# Patient Record
Sex: Female | Born: 1970
Health system: Southern US, Community
[De-identification: ages and names within clinical notes are randomized; demographics above are authoritative.]

## PROBLEM LIST (undated history)

## (undated) DIAGNOSIS — T4145XA Adverse effect of unspecified anesthetic, initial encounter: Secondary | ICD-10-CM

## (undated) DIAGNOSIS — F329 Major depressive disorder, single episode, unspecified: Secondary | ICD-10-CM

## (undated) DIAGNOSIS — M1990 Unspecified osteoarthritis, unspecified site: Secondary | ICD-10-CM

## (undated) DIAGNOSIS — F419 Anxiety disorder, unspecified: Secondary | ICD-10-CM

## (undated) DIAGNOSIS — K645 Perianal venous thrombosis: Principal | ICD-10-CM

## (undated) DIAGNOSIS — Z87440 Personal history of urinary (tract) infections: Secondary | ICD-10-CM

## (undated) DIAGNOSIS — Z87442 Personal history of urinary calculi: Secondary | ICD-10-CM

## (undated) DIAGNOSIS — M6289 Other specified disorders of muscle: Secondary | ICD-10-CM

## (undated) DIAGNOSIS — M545 Low back pain, unspecified: Secondary | ICD-10-CM

## (undated) DIAGNOSIS — G43909 Migraine, unspecified, not intractable, without status migrainosus: Secondary | ICD-10-CM

## (undated) DIAGNOSIS — G8929 Other chronic pain: Secondary | ICD-10-CM

## (undated) DIAGNOSIS — E785 Hyperlipidemia, unspecified: Secondary | ICD-10-CM

## (undated) DIAGNOSIS — I10 Essential (primary) hypertension: Secondary | ICD-10-CM

## (undated) DIAGNOSIS — N3010 Interstitial cystitis (chronic) without hematuria: Secondary | ICD-10-CM

## (undated) DIAGNOSIS — Z960 Presence of urogenital implants: Secondary | ICD-10-CM

## (undated) DIAGNOSIS — N200 Calculus of kidney: Secondary | ICD-10-CM

## (undated) DIAGNOSIS — R569 Unspecified convulsions: Secondary | ICD-10-CM

## (undated) DIAGNOSIS — F319 Bipolar disorder, unspecified: Secondary | ICD-10-CM

## (undated) DIAGNOSIS — K648 Other hemorrhoids: Secondary | ICD-10-CM

## (undated) DIAGNOSIS — M797 Fibromyalgia: Secondary | ICD-10-CM

## (undated) DIAGNOSIS — G894 Chronic pain syndrome: Secondary | ICD-10-CM

## (undated) DIAGNOSIS — K219 Gastro-esophageal reflux disease without esophagitis: Secondary | ICD-10-CM

## (undated) DIAGNOSIS — R011 Cardiac murmur, unspecified: Secondary | ICD-10-CM

## (undated) DIAGNOSIS — F32A Depression, unspecified: Secondary | ICD-10-CM

## (undated) DIAGNOSIS — K589 Irritable bowel syndrome without diarrhea: Secondary | ICD-10-CM

## (undated) HISTORY — DX: Other specified disorders of muscle: M62.89

## (undated) HISTORY — DX: Anxiety disorder, unspecified: F41.9

## (undated) HISTORY — DX: Bipolar disorder, unspecified: F31.9

## (undated) HISTORY — DX: Hyperlipidemia, unspecified: E78.5

## (undated) HISTORY — DX: Personal history of urinary calculi: Z87.442

## (undated) HISTORY — PX: CYSTOSCOPY: SUR368

## (undated) HISTORY — DX: Other hemorrhoids: K64.8

## (undated) HISTORY — PX: ABDOMINAL HYSTERECTOMY: SHX81

## (undated) HISTORY — DX: Irritable bowel syndrome, unspecified: K58.9

## (undated) HISTORY — PX: MANDIBLE FRACTURE SURGERY: SHX706

## (undated) HISTORY — DX: Essential (primary) hypertension: I10

## (undated) HISTORY — DX: Unspecified osteoarthritis, unspecified site: M19.90

## (undated) HISTORY — PX: FOOT SURGERY: SHX648

## (undated) HISTORY — PX: LITHOTRIPSY: SUR834

## (undated) HISTORY — PX: COLONOSCOPY: SHX174

## (undated) HISTORY — DX: Personal history of urinary (tract) infections: Z87.440

## (undated) HISTORY — DX: Major depressive disorder, single episode, unspecified: F32.9

## (undated) HISTORY — DX: Cardiac murmur, unspecified: R01.1

## (undated) HISTORY — DX: Perianal venous thrombosis: K64.5

## (undated) HISTORY — PX: RADIOFREQUENCY ABLATION NERVES: SUR1070

## (undated) HISTORY — DX: Depression, unspecified: F32.A

## (undated) HISTORY — DX: Migraine, unspecified, not intractable, without status migrainosus: G43.909

## (undated) HISTORY — PX: OTHER SURGICAL HISTORY: SHX169

## (undated) HISTORY — DX: Fibromyalgia: M79.7

## (undated) HISTORY — PX: KNEE SURGERY: SHX244

## (undated) SURGERY — Surgical Case
Anesthesia: *Unknown

---

## 1997-12-07 ENCOUNTER — Emergency Department (HOSPITAL_COMMUNITY): Admission: EM | Admit: 1997-12-07 | Discharge: 1997-12-07 | Payer: Self-pay | Admitting: Emergency Medicine

## 1998-02-23 ENCOUNTER — Emergency Department (HOSPITAL_COMMUNITY): Admission: EM | Admit: 1998-02-23 | Discharge: 1998-02-24 | Payer: Self-pay | Admitting: Emergency Medicine

## 1999-05-31 DIAGNOSIS — R569 Unspecified convulsions: Secondary | ICD-10-CM

## 1999-05-31 DIAGNOSIS — T8859XA Other complications of anesthesia, initial encounter: Secondary | ICD-10-CM

## 1999-05-31 HISTORY — DX: Other complications of anesthesia, initial encounter: T88.59XA

## 1999-05-31 HISTORY — DX: Unspecified convulsions: R56.9

## 1999-07-05 ENCOUNTER — Emergency Department (HOSPITAL_COMMUNITY): Admission: EM | Admit: 1999-07-05 | Discharge: 1999-07-05 | Payer: Self-pay | Admitting: Emergency Medicine

## 1999-07-06 ENCOUNTER — Encounter: Payer: Self-pay | Admitting: Emergency Medicine

## 1999-09-20 ENCOUNTER — Encounter: Payer: Self-pay | Admitting: Urology

## 1999-09-20 ENCOUNTER — Encounter: Payer: Self-pay | Admitting: Anesthesiology

## 1999-09-20 ENCOUNTER — Inpatient Hospital Stay (HOSPITAL_COMMUNITY): Admission: EM | Admit: 1999-09-20 | Discharge: 1999-09-26 | Payer: Self-pay | Admitting: Emergency Medicine

## 1999-09-20 ENCOUNTER — Encounter: Payer: Self-pay | Admitting: Emergency Medicine

## 1999-09-21 ENCOUNTER — Encounter: Payer: Self-pay | Admitting: Urology

## 1999-09-22 ENCOUNTER — Encounter: Payer: Self-pay | Admitting: Urology

## 1999-09-23 ENCOUNTER — Encounter: Payer: Self-pay | Admitting: Urology

## 1999-09-30 ENCOUNTER — Encounter: Payer: Self-pay | Admitting: Urology

## 1999-09-30 ENCOUNTER — Ambulatory Visit (HOSPITAL_COMMUNITY): Admission: RE | Admit: 1999-09-30 | Discharge: 1999-09-30 | Payer: Self-pay | Admitting: Urology

## 1999-10-06 ENCOUNTER — Encounter: Payer: Self-pay | Admitting: Neurology

## 1999-10-06 ENCOUNTER — Inpatient Hospital Stay (HOSPITAL_COMMUNITY): Admission: EM | Admit: 1999-10-06 | Discharge: 1999-10-08 | Payer: Self-pay | Admitting: Dentistry

## 1999-10-07 ENCOUNTER — Encounter: Payer: Self-pay | Admitting: Neurology

## 2000-05-16 ENCOUNTER — Other Ambulatory Visit: Admission: RE | Admit: 2000-05-16 | Discharge: 2000-05-16 | Payer: Self-pay | Admitting: *Deleted

## 2000-05-29 ENCOUNTER — Ambulatory Visit (HOSPITAL_COMMUNITY): Admission: RE | Admit: 2000-05-29 | Discharge: 2000-05-29 | Payer: Self-pay | Admitting: Neurology

## 2000-05-29 ENCOUNTER — Encounter: Payer: Self-pay | Admitting: Neurology

## 2000-07-24 ENCOUNTER — Emergency Department (HOSPITAL_COMMUNITY): Admission: EM | Admit: 2000-07-24 | Discharge: 2000-07-24 | Payer: Self-pay | Admitting: Emergency Medicine

## 2000-07-25 ENCOUNTER — Emergency Department (HOSPITAL_COMMUNITY): Admission: EM | Admit: 2000-07-25 | Discharge: 2000-07-25 | Payer: Self-pay | Admitting: Emergency Medicine

## 2000-07-27 ENCOUNTER — Encounter: Admission: RE | Admit: 2000-07-27 | Discharge: 2000-10-25 | Payer: Self-pay | Admitting: Anesthesiology

## 2000-09-28 ENCOUNTER — Emergency Department (HOSPITAL_COMMUNITY): Admission: EM | Admit: 2000-09-28 | Discharge: 2000-09-29 | Payer: Self-pay | Admitting: Emergency Medicine

## 2000-10-01 ENCOUNTER — Inpatient Hospital Stay (HOSPITAL_COMMUNITY): Admission: EM | Admit: 2000-10-01 | Discharge: 2000-10-04 | Payer: Self-pay | Admitting: Emergency Medicine

## 2000-10-02 ENCOUNTER — Encounter: Payer: Self-pay | Admitting: Neurosurgery

## 2000-10-04 ENCOUNTER — Encounter: Payer: Self-pay | Admitting: Internal Medicine

## 2000-10-09 ENCOUNTER — Emergency Department (HOSPITAL_COMMUNITY): Admission: EM | Admit: 2000-10-09 | Discharge: 2000-10-09 | Payer: Self-pay | Admitting: Emergency Medicine

## 2000-10-19 ENCOUNTER — Inpatient Hospital Stay (HOSPITAL_COMMUNITY): Admission: EM | Admit: 2000-10-19 | Discharge: 2000-10-22 | Payer: Self-pay | Admitting: Internal Medicine

## 2000-10-30 ENCOUNTER — Encounter: Admission: RE | Admit: 2000-10-30 | Discharge: 2000-12-27 | Payer: Self-pay | Admitting: Anesthesiology

## 2001-03-02 ENCOUNTER — Ambulatory Visit (HOSPITAL_BASED_OUTPATIENT_CLINIC_OR_DEPARTMENT_OTHER): Admission: RE | Admit: 2001-03-02 | Discharge: 2001-03-02 | Payer: Self-pay | Admitting: Urology

## 2001-04-12 ENCOUNTER — Emergency Department (HOSPITAL_COMMUNITY): Admission: EM | Admit: 2001-04-12 | Discharge: 2001-04-12 | Payer: Self-pay | Admitting: Emergency Medicine

## 2001-04-12 ENCOUNTER — Encounter: Payer: Self-pay | Admitting: Emergency Medicine

## 2001-05-21 ENCOUNTER — Other Ambulatory Visit: Admission: RE | Admit: 2001-05-21 | Discharge: 2001-05-21 | Payer: Self-pay | Admitting: *Deleted

## 2001-05-29 ENCOUNTER — Encounter: Payer: Self-pay | Admitting: Emergency Medicine

## 2001-05-29 ENCOUNTER — Emergency Department (HOSPITAL_COMMUNITY): Admission: EM | Admit: 2001-05-29 | Discharge: 2001-05-30 | Payer: Self-pay | Admitting: Emergency Medicine

## 2001-05-31 ENCOUNTER — Emergency Department (HOSPITAL_COMMUNITY): Admission: EM | Admit: 2001-05-31 | Discharge: 2001-05-31 | Payer: Self-pay | Admitting: Emergency Medicine

## 2001-05-31 ENCOUNTER — Encounter: Payer: Self-pay | Admitting: Emergency Medicine

## 2001-06-06 ENCOUNTER — Encounter: Payer: Self-pay | Admitting: Urology

## 2001-06-06 ENCOUNTER — Ambulatory Visit (HOSPITAL_BASED_OUTPATIENT_CLINIC_OR_DEPARTMENT_OTHER): Admission: RE | Admit: 2001-06-06 | Discharge: 2001-06-06 | Payer: Self-pay | Admitting: Urology

## 2001-07-13 ENCOUNTER — Emergency Department (HOSPITAL_COMMUNITY): Admission: EM | Admit: 2001-07-13 | Discharge: 2001-07-13 | Payer: Self-pay | Admitting: Emergency Medicine

## 2001-07-14 ENCOUNTER — Inpatient Hospital Stay (HOSPITAL_COMMUNITY): Admission: EM | Admit: 2001-07-14 | Discharge: 2001-07-18 | Payer: Self-pay | Admitting: Emergency Medicine

## 2001-07-15 ENCOUNTER — Encounter: Payer: Self-pay | Admitting: Neurology

## 2001-07-17 ENCOUNTER — Encounter: Payer: Self-pay | Admitting: Neurology

## 2001-09-12 ENCOUNTER — Ambulatory Visit (HOSPITAL_BASED_OUTPATIENT_CLINIC_OR_DEPARTMENT_OTHER): Admission: RE | Admit: 2001-09-12 | Discharge: 2001-09-12 | Payer: Self-pay | Admitting: Urology

## 2001-12-01 ENCOUNTER — Encounter: Payer: Self-pay | Admitting: Emergency Medicine

## 2001-12-01 ENCOUNTER — Emergency Department (HOSPITAL_COMMUNITY): Admission: EM | Admit: 2001-12-01 | Discharge: 2001-12-01 | Payer: Self-pay | Admitting: Emergency Medicine

## 2002-01-06 ENCOUNTER — Emergency Department (HOSPITAL_COMMUNITY): Admission: EM | Admit: 2002-01-06 | Discharge: 2002-01-06 | Payer: Self-pay | Admitting: Emergency Medicine

## 2002-01-06 ENCOUNTER — Encounter: Payer: Self-pay | Admitting: Emergency Medicine

## 2002-01-18 ENCOUNTER — Encounter: Payer: Self-pay | Admitting: Neurosurgery

## 2002-01-18 ENCOUNTER — Encounter: Admission: RE | Admit: 2002-01-18 | Discharge: 2002-01-18 | Payer: Self-pay | Admitting: Neurosurgery

## 2002-03-04 ENCOUNTER — Emergency Department (HOSPITAL_COMMUNITY): Admission: EM | Admit: 2002-03-04 | Discharge: 2002-03-04 | Payer: Self-pay | Admitting: *Deleted

## 2002-03-05 ENCOUNTER — Observation Stay (HOSPITAL_COMMUNITY): Admission: AD | Admit: 2002-03-05 | Discharge: 2002-03-06 | Payer: Self-pay | Admitting: Urology

## 2002-03-14 ENCOUNTER — Encounter: Payer: Self-pay | Admitting: Urology

## 2002-03-14 ENCOUNTER — Ambulatory Visit (HOSPITAL_BASED_OUTPATIENT_CLINIC_OR_DEPARTMENT_OTHER): Admission: RE | Admit: 2002-03-14 | Discharge: 2002-03-14 | Payer: Self-pay | Admitting: Urology

## 2002-03-20 ENCOUNTER — Ambulatory Visit (HOSPITAL_BASED_OUTPATIENT_CLINIC_OR_DEPARTMENT_OTHER): Admission: RE | Admit: 2002-03-20 | Discharge: 2002-03-20 | Payer: Self-pay | Admitting: Urology

## 2002-06-17 ENCOUNTER — Ambulatory Visit (HOSPITAL_BASED_OUTPATIENT_CLINIC_OR_DEPARTMENT_OTHER): Admission: RE | Admit: 2002-06-17 | Discharge: 2002-06-17 | Payer: Self-pay | Admitting: Urology

## 2002-07-06 ENCOUNTER — Encounter: Payer: Self-pay | Admitting: Emergency Medicine

## 2002-07-06 ENCOUNTER — Ambulatory Visit (HOSPITAL_COMMUNITY): Admission: EM | Admit: 2002-07-06 | Discharge: 2002-07-06 | Payer: Self-pay | Admitting: Emergency Medicine

## 2002-07-08 ENCOUNTER — Inpatient Hospital Stay (HOSPITAL_COMMUNITY): Admission: RE | Admit: 2002-07-08 | Discharge: 2002-07-12 | Payer: Self-pay | Admitting: Urology

## 2002-07-08 ENCOUNTER — Encounter: Payer: Self-pay | Admitting: Urology

## 2002-07-11 ENCOUNTER — Encounter: Payer: Self-pay | Admitting: Urology

## 2002-10-03 ENCOUNTER — Emergency Department (HOSPITAL_COMMUNITY): Admission: EM | Admit: 2002-10-03 | Discharge: 2002-10-04 | Payer: Self-pay | Admitting: Emergency Medicine

## 2002-10-03 ENCOUNTER — Encounter: Payer: Self-pay | Admitting: Emergency Medicine

## 2002-11-11 ENCOUNTER — Ambulatory Visit (HOSPITAL_BASED_OUTPATIENT_CLINIC_OR_DEPARTMENT_OTHER): Admission: RE | Admit: 2002-11-11 | Discharge: 2002-11-11 | Payer: Self-pay | Admitting: Urology

## 2003-03-10 ENCOUNTER — Ambulatory Visit (HOSPITAL_BASED_OUTPATIENT_CLINIC_OR_DEPARTMENT_OTHER): Admission: RE | Admit: 2003-03-10 | Discharge: 2003-03-10 | Payer: Self-pay | Admitting: Urology

## 2003-03-10 ENCOUNTER — Ambulatory Visit (HOSPITAL_COMMUNITY): Admission: RE | Admit: 2003-03-10 | Discharge: 2003-03-10 | Payer: Self-pay | Admitting: Urology

## 2003-07-31 ENCOUNTER — Inpatient Hospital Stay (HOSPITAL_COMMUNITY): Admission: EM | Admit: 2003-07-31 | Discharge: 2003-08-07 | Payer: Self-pay | Admitting: Urology

## 2003-08-05 ENCOUNTER — Encounter (INDEPENDENT_AMBULATORY_CARE_PROVIDER_SITE_OTHER): Payer: Self-pay | Admitting: Specialist

## 2003-08-15 ENCOUNTER — Ambulatory Visit (HOSPITAL_BASED_OUTPATIENT_CLINIC_OR_DEPARTMENT_OTHER): Admission: RE | Admit: 2003-08-15 | Discharge: 2003-08-15 | Payer: Self-pay | Admitting: Urology

## 2003-08-15 ENCOUNTER — Ambulatory Visit (HOSPITAL_COMMUNITY): Admission: RE | Admit: 2003-08-15 | Discharge: 2003-08-15 | Payer: Self-pay | Admitting: Urology

## 2003-08-29 ENCOUNTER — Ambulatory Visit (HOSPITAL_BASED_OUTPATIENT_CLINIC_OR_DEPARTMENT_OTHER): Admission: RE | Admit: 2003-08-29 | Discharge: 2003-08-29 | Payer: Self-pay | Admitting: Urology

## 2003-08-29 ENCOUNTER — Ambulatory Visit (HOSPITAL_COMMUNITY): Admission: RE | Admit: 2003-08-29 | Discharge: 2003-08-29 | Payer: Self-pay | Admitting: Urology

## 2003-10-03 ENCOUNTER — Ambulatory Visit (HOSPITAL_COMMUNITY): Admission: RE | Admit: 2003-10-03 | Discharge: 2003-10-03 | Payer: Self-pay | Admitting: *Deleted

## 2003-11-12 ENCOUNTER — Emergency Department (HOSPITAL_COMMUNITY): Admission: EM | Admit: 2003-11-12 | Discharge: 2003-11-12 | Payer: Self-pay | Admitting: Emergency Medicine

## 2003-12-30 ENCOUNTER — Ambulatory Visit (HOSPITAL_BASED_OUTPATIENT_CLINIC_OR_DEPARTMENT_OTHER): Admission: RE | Admit: 2003-12-30 | Discharge: 2003-12-30 | Payer: Self-pay | Admitting: Urology

## 2003-12-30 ENCOUNTER — Ambulatory Visit (HOSPITAL_COMMUNITY): Admission: RE | Admit: 2003-12-30 | Discharge: 2003-12-30 | Payer: Self-pay | Admitting: Urology

## 2004-04-06 ENCOUNTER — Ambulatory Visit (HOSPITAL_COMMUNITY): Admission: RE | Admit: 2004-04-06 | Discharge: 2004-04-06 | Payer: Self-pay | Admitting: Urology

## 2004-04-06 ENCOUNTER — Ambulatory Visit (HOSPITAL_BASED_OUTPATIENT_CLINIC_OR_DEPARTMENT_OTHER): Admission: RE | Admit: 2004-04-06 | Discharge: 2004-04-06 | Payer: Self-pay | Admitting: Urology

## 2004-06-07 ENCOUNTER — Emergency Department (HOSPITAL_COMMUNITY): Admission: EM | Admit: 2004-06-07 | Discharge: 2004-06-07 | Payer: Self-pay | Admitting: Emergency Medicine

## 2004-06-14 ENCOUNTER — Inpatient Hospital Stay (HOSPITAL_COMMUNITY): Admission: AD | Admit: 2004-06-14 | Discharge: 2004-06-19 | Payer: Self-pay | Admitting: Urology

## 2004-06-17 ENCOUNTER — Encounter (INDEPENDENT_AMBULATORY_CARE_PROVIDER_SITE_OTHER): Payer: Self-pay | Admitting: *Deleted

## 2004-07-20 ENCOUNTER — Ambulatory Visit: Payer: Self-pay | Admitting: Internal Medicine

## 2004-08-11 ENCOUNTER — Encounter (INDEPENDENT_AMBULATORY_CARE_PROVIDER_SITE_OTHER): Payer: Self-pay | Admitting: Specialist

## 2004-08-11 ENCOUNTER — Ambulatory Visit (HOSPITAL_COMMUNITY): Admission: RE | Admit: 2004-08-11 | Discharge: 2004-08-11 | Payer: Self-pay | Admitting: *Deleted

## 2004-08-24 ENCOUNTER — Emergency Department (HOSPITAL_COMMUNITY): Admission: EM | Admit: 2004-08-24 | Discharge: 2004-08-24 | Payer: Self-pay | Admitting: Emergency Medicine

## 2004-10-07 ENCOUNTER — Ambulatory Visit: Payer: Self-pay | Admitting: Endocrinology

## 2004-10-11 ENCOUNTER — Ambulatory Visit: Payer: Self-pay | Admitting: Endocrinology

## 2004-11-04 ENCOUNTER — Ambulatory Visit (HOSPITAL_COMMUNITY): Admission: RE | Admit: 2004-11-04 | Discharge: 2004-11-04 | Payer: Self-pay | Admitting: *Deleted

## 2004-11-04 ENCOUNTER — Encounter (INDEPENDENT_AMBULATORY_CARE_PROVIDER_SITE_OTHER): Payer: Self-pay | Admitting: *Deleted

## 2004-11-05 ENCOUNTER — Inpatient Hospital Stay (HOSPITAL_COMMUNITY): Admission: EM | Admit: 2004-11-05 | Discharge: 2004-11-12 | Payer: Self-pay | Admitting: *Deleted

## 2004-11-05 ENCOUNTER — Ambulatory Visit: Payer: Self-pay | Admitting: Internal Medicine

## 2004-11-10 ENCOUNTER — Encounter (INDEPENDENT_AMBULATORY_CARE_PROVIDER_SITE_OTHER): Payer: Self-pay | Admitting: Specialist

## 2005-01-25 ENCOUNTER — Ambulatory Visit (HOSPITAL_BASED_OUTPATIENT_CLINIC_OR_DEPARTMENT_OTHER): Admission: RE | Admit: 2005-01-25 | Discharge: 2005-01-25 | Payer: Self-pay | Admitting: Urology

## 2005-01-25 ENCOUNTER — Ambulatory Visit (HOSPITAL_COMMUNITY): Admission: RE | Admit: 2005-01-25 | Discharge: 2005-01-25 | Payer: Self-pay | Admitting: Urology

## 2005-02-05 ENCOUNTER — Emergency Department (HOSPITAL_COMMUNITY): Admission: EM | Admit: 2005-02-05 | Discharge: 2005-02-05 | Payer: Self-pay | Admitting: Emergency Medicine

## 2005-04-26 ENCOUNTER — Encounter: Admission: RE | Admit: 2005-04-26 | Discharge: 2005-04-26 | Payer: Self-pay | Admitting: *Deleted

## 2005-05-17 ENCOUNTER — Ambulatory Visit (HOSPITAL_BASED_OUTPATIENT_CLINIC_OR_DEPARTMENT_OTHER): Admission: RE | Admit: 2005-05-17 | Discharge: 2005-05-17 | Payer: Self-pay | Admitting: Urology

## 2005-05-17 ENCOUNTER — Ambulatory Visit (HOSPITAL_COMMUNITY): Admission: RE | Admit: 2005-05-17 | Discharge: 2005-05-17 | Payer: Self-pay | Admitting: Urology

## 2005-05-30 HISTORY — PX: GASTROJEJUNOSTOMY W/ JEJUNOSTOMY TUBE: SHX1698

## 2005-06-03 ENCOUNTER — Ambulatory Visit (HOSPITAL_COMMUNITY): Admission: RE | Admit: 2005-06-03 | Discharge: 2005-06-03 | Payer: Self-pay | Admitting: *Deleted

## 2005-06-15 ENCOUNTER — Ambulatory Visit: Payer: Self-pay | Admitting: Endocrinology

## 2005-06-22 ENCOUNTER — Ambulatory Visit: Payer: Self-pay | Admitting: Endocrinology

## 2005-06-25 ENCOUNTER — Emergency Department (HOSPITAL_COMMUNITY): Admission: EM | Admit: 2005-06-25 | Discharge: 2005-06-25 | Payer: Self-pay | Admitting: Emergency Medicine

## 2005-06-29 ENCOUNTER — Ambulatory Visit: Payer: Self-pay | Admitting: Internal Medicine

## 2005-07-03 ENCOUNTER — Emergency Department (HOSPITAL_COMMUNITY): Admission: EM | Admit: 2005-07-03 | Discharge: 2005-07-04 | Payer: Self-pay | Admitting: Emergency Medicine

## 2005-07-05 ENCOUNTER — Ambulatory Visit: Payer: Self-pay | Admitting: Internal Medicine

## 2005-07-08 ENCOUNTER — Ambulatory Visit (HOSPITAL_COMMUNITY): Admission: RE | Admit: 2005-07-08 | Discharge: 2005-07-08 | Payer: Self-pay | Admitting: Internal Medicine

## 2005-07-14 ENCOUNTER — Emergency Department (HOSPITAL_COMMUNITY): Admission: EM | Admit: 2005-07-14 | Discharge: 2005-07-14 | Payer: Self-pay | Admitting: Emergency Medicine

## 2005-07-30 ENCOUNTER — Ambulatory Visit (HOSPITAL_COMMUNITY): Admission: RE | Admit: 2005-07-30 | Discharge: 2005-07-30 | Payer: Self-pay | Admitting: Internal Medicine

## 2005-08-05 ENCOUNTER — Ambulatory Visit: Payer: Self-pay | Admitting: Internal Medicine

## 2005-08-10 ENCOUNTER — Ambulatory Visit (HOSPITAL_COMMUNITY): Admission: RE | Admit: 2005-08-10 | Discharge: 2005-08-10 | Payer: Self-pay | Admitting: Internal Medicine

## 2005-08-19 ENCOUNTER — Ambulatory Visit (HOSPITAL_COMMUNITY): Admission: RE | Admit: 2005-08-19 | Discharge: 2005-08-19 | Payer: Self-pay | Admitting: Internal Medicine

## 2005-08-23 ENCOUNTER — Ambulatory Visit (HOSPITAL_COMMUNITY): Admission: RE | Admit: 2005-08-23 | Discharge: 2005-08-23 | Payer: Self-pay | Admitting: Internal Medicine

## 2005-08-24 ENCOUNTER — Ambulatory Visit: Payer: Self-pay | Admitting: Internal Medicine

## 2005-08-29 ENCOUNTER — Ambulatory Visit: Payer: Self-pay | Admitting: Endocrinology

## 2005-08-30 ENCOUNTER — Ambulatory Visit (HOSPITAL_BASED_OUTPATIENT_CLINIC_OR_DEPARTMENT_OTHER): Admission: RE | Admit: 2005-08-30 | Discharge: 2005-08-30 | Payer: Self-pay | Admitting: Urology

## 2005-09-02 ENCOUNTER — Encounter (INDEPENDENT_AMBULATORY_CARE_PROVIDER_SITE_OTHER): Payer: Self-pay | Admitting: *Deleted

## 2005-09-02 ENCOUNTER — Emergency Department (HOSPITAL_COMMUNITY): Admission: EM | Admit: 2005-09-02 | Discharge: 2005-09-02 | Payer: Self-pay | Admitting: Emergency Medicine

## 2005-09-05 ENCOUNTER — Ambulatory Visit: Payer: Self-pay | Admitting: Gastroenterology

## 2005-09-05 ENCOUNTER — Ambulatory Visit (HOSPITAL_COMMUNITY): Admission: RE | Admit: 2005-09-05 | Discharge: 2005-09-05 | Payer: Self-pay | Admitting: Internal Medicine

## 2005-09-06 ENCOUNTER — Ambulatory Visit (HOSPITAL_COMMUNITY): Admission: RE | Admit: 2005-09-06 | Discharge: 2005-09-06 | Payer: Self-pay | Admitting: Gastroenterology

## 2005-09-22 ENCOUNTER — Ambulatory Visit (HOSPITAL_COMMUNITY): Admission: RE | Admit: 2005-09-22 | Discharge: 2005-09-22 | Payer: Self-pay | Admitting: Internal Medicine

## 2005-09-22 HISTORY — PX: UPPER GASTROINTESTINAL ENDOSCOPY: SHX188

## 2005-10-08 ENCOUNTER — Encounter: Payer: Self-pay | Admitting: Internal Medicine

## 2005-11-04 ENCOUNTER — Ambulatory Visit: Payer: Self-pay | Admitting: Internal Medicine

## 2005-12-09 ENCOUNTER — Ambulatory Visit: Payer: Self-pay | Admitting: Internal Medicine

## 2005-12-14 ENCOUNTER — Ambulatory Visit: Payer: Self-pay | Admitting: Internal Medicine

## 2006-02-01 ENCOUNTER — Ambulatory Visit: Payer: Self-pay | Admitting: Internal Medicine

## 2006-02-03 ENCOUNTER — Ambulatory Visit (HOSPITAL_BASED_OUTPATIENT_CLINIC_OR_DEPARTMENT_OTHER): Admission: RE | Admit: 2006-02-03 | Discharge: 2006-02-03 | Payer: Self-pay

## 2006-03-08 ENCOUNTER — Ambulatory Visit (HOSPITAL_COMMUNITY): Admission: RE | Admit: 2006-03-08 | Discharge: 2006-03-08 | Payer: Self-pay | Admitting: Internal Medicine

## 2006-03-08 ENCOUNTER — Ambulatory Visit: Payer: Self-pay | Admitting: Internal Medicine

## 2006-03-10 ENCOUNTER — Ambulatory Visit (HOSPITAL_COMMUNITY): Admission: RE | Admit: 2006-03-10 | Discharge: 2006-03-10 | Payer: Self-pay | Admitting: Internal Medicine

## 2006-03-15 ENCOUNTER — Observation Stay (HOSPITAL_COMMUNITY): Admission: EM | Admit: 2006-03-15 | Discharge: 2006-03-17 | Payer: Self-pay | Admitting: Emergency Medicine

## 2006-03-20 ENCOUNTER — Ambulatory Visit: Payer: Self-pay | Admitting: Internal Medicine

## 2006-03-28 ENCOUNTER — Ambulatory Visit: Payer: Self-pay | Admitting: Internal Medicine

## 2006-06-27 ENCOUNTER — Ambulatory Visit: Payer: Self-pay | Admitting: Internal Medicine

## 2006-07-28 ENCOUNTER — Encounter: Admission: RE | Admit: 2006-07-28 | Discharge: 2006-07-28 | Payer: Self-pay | Admitting: *Deleted

## 2006-08-14 ENCOUNTER — Ambulatory Visit (HOSPITAL_BASED_OUTPATIENT_CLINIC_OR_DEPARTMENT_OTHER): Admission: RE | Admit: 2006-08-14 | Discharge: 2006-08-14 | Payer: Self-pay | Admitting: Urology

## 2006-08-22 ENCOUNTER — Encounter: Payer: Self-pay | Admitting: Internal Medicine

## 2006-09-13 ENCOUNTER — Observation Stay (HOSPITAL_COMMUNITY): Admission: EM | Admit: 2006-09-13 | Discharge: 2006-09-14 | Payer: Self-pay | Admitting: Emergency Medicine

## 2006-09-14 ENCOUNTER — Encounter: Payer: Self-pay | Admitting: Internal Medicine

## 2006-09-14 HISTORY — PX: UPPER GASTROINTESTINAL ENDOSCOPY: SHX188

## 2006-09-19 ENCOUNTER — Ambulatory Visit: Payer: Self-pay | Admitting: Internal Medicine

## 2006-10-16 ENCOUNTER — Encounter: Admission: RE | Admit: 2006-10-16 | Discharge: 2006-10-16 | Payer: Self-pay | Admitting: Neurology

## 2006-12-05 ENCOUNTER — Emergency Department (HOSPITAL_COMMUNITY): Admission: EM | Admit: 2006-12-05 | Discharge: 2006-12-05 | Payer: Self-pay | Admitting: Emergency Medicine

## 2007-01-25 ENCOUNTER — Emergency Department (HOSPITAL_COMMUNITY): Admission: EM | Admit: 2007-01-25 | Discharge: 2007-01-25 | Payer: Self-pay | Admitting: Emergency Medicine

## 2007-02-07 ENCOUNTER — Emergency Department (HOSPITAL_COMMUNITY): Admission: EM | Admit: 2007-02-07 | Discharge: 2007-02-07 | Payer: Self-pay | Admitting: Emergency Medicine

## 2007-04-10 ENCOUNTER — Ambulatory Visit (HOSPITAL_BASED_OUTPATIENT_CLINIC_OR_DEPARTMENT_OTHER): Admission: RE | Admit: 2007-04-10 | Discharge: 2007-04-10 | Payer: Self-pay | Admitting: Urology

## 2007-07-17 ENCOUNTER — Ambulatory Visit: Payer: Self-pay | Admitting: Internal Medicine

## 2007-07-17 DIAGNOSIS — Z8669 Personal history of other diseases of the nervous system and sense organs: Secondary | ICD-10-CM

## 2007-07-17 DIAGNOSIS — Z8711 Personal history of peptic ulcer disease: Secondary | ICD-10-CM | POA: Insufficient documentation

## 2007-07-17 DIAGNOSIS — I059 Rheumatic mitral valve disease, unspecified: Secondary | ICD-10-CM | POA: Insufficient documentation

## 2007-07-17 DIAGNOSIS — N3010 Interstitial cystitis (chronic) without hematuria: Secondary | ICD-10-CM | POA: Insufficient documentation

## 2007-07-17 DIAGNOSIS — F411 Generalized anxiety disorder: Secondary | ICD-10-CM | POA: Insufficient documentation

## 2007-07-17 DIAGNOSIS — G43909 Migraine, unspecified, not intractable, without status migrainosus: Secondary | ICD-10-CM | POA: Insufficient documentation

## 2007-07-17 DIAGNOSIS — Z87442 Personal history of urinary calculi: Secondary | ICD-10-CM | POA: Insufficient documentation

## 2007-07-17 DIAGNOSIS — F4310 Post-traumatic stress disorder, unspecified: Secondary | ICD-10-CM | POA: Insufficient documentation

## 2007-07-17 DIAGNOSIS — R109 Unspecified abdominal pain: Secondary | ICD-10-CM | POA: Insufficient documentation

## 2007-07-17 HISTORY — DX: Personal history of other diseases of the nervous system and sense organs: Z86.69

## 2007-10-09 ENCOUNTER — Encounter: Admission: RE | Admit: 2007-10-09 | Discharge: 2007-10-09 | Payer: Self-pay | Admitting: Orthopedic Surgery

## 2007-10-09 ENCOUNTER — Telehealth: Payer: Self-pay | Admitting: Internal Medicine

## 2007-10-10 ENCOUNTER — Telehealth: Payer: Self-pay | Admitting: Internal Medicine

## 2007-10-11 ENCOUNTER — Emergency Department (HOSPITAL_COMMUNITY): Admission: EM | Admit: 2007-10-11 | Discharge: 2007-10-11 | Payer: Self-pay | Admitting: Emergency Medicine

## 2007-10-12 ENCOUNTER — Ambulatory Visit (HOSPITAL_COMMUNITY): Admission: AD | Admit: 2007-10-12 | Discharge: 2007-10-13 | Payer: Self-pay | Admitting: Urology

## 2007-11-09 ENCOUNTER — Observation Stay (HOSPITAL_COMMUNITY): Admission: AD | Admit: 2007-11-09 | Discharge: 2007-11-10 | Payer: Self-pay | Admitting: Internal Medicine

## 2007-12-04 ENCOUNTER — Telehealth: Payer: Self-pay | Admitting: Internal Medicine

## 2007-12-04 ENCOUNTER — Encounter: Payer: Self-pay | Admitting: Internal Medicine

## 2008-01-04 ENCOUNTER — Ambulatory Visit (HOSPITAL_BASED_OUTPATIENT_CLINIC_OR_DEPARTMENT_OTHER): Admission: RE | Admit: 2008-01-04 | Discharge: 2008-01-04 | Payer: Self-pay | Admitting: Urology

## 2008-03-14 ENCOUNTER — Emergency Department (HOSPITAL_COMMUNITY): Admission: EM | Admit: 2008-03-14 | Discharge: 2008-03-14 | Payer: Self-pay | Admitting: Emergency Medicine

## 2008-04-15 ENCOUNTER — Ambulatory Visit (HOSPITAL_BASED_OUTPATIENT_CLINIC_OR_DEPARTMENT_OTHER): Admission: RE | Admit: 2008-04-15 | Discharge: 2008-04-15 | Payer: Self-pay | Admitting: Urology

## 2008-06-05 ENCOUNTER — Encounter: Payer: Self-pay | Admitting: Endocrinology

## 2008-08-24 ENCOUNTER — Emergency Department (HOSPITAL_COMMUNITY): Admission: EM | Admit: 2008-08-24 | Discharge: 2008-08-25 | Payer: Self-pay | Admitting: Emergency Medicine

## 2008-09-03 ENCOUNTER — Encounter: Payer: Self-pay | Admitting: Endocrinology

## 2008-10-13 ENCOUNTER — Emergency Department (HOSPITAL_BASED_OUTPATIENT_CLINIC_OR_DEPARTMENT_OTHER): Admission: EM | Admit: 2008-10-13 | Discharge: 2008-10-14 | Payer: Self-pay | Admitting: Emergency Medicine

## 2008-10-15 ENCOUNTER — Ambulatory Visit: Payer: Self-pay | Admitting: Interventional Radiology

## 2008-10-15 ENCOUNTER — Emergency Department (HOSPITAL_BASED_OUTPATIENT_CLINIC_OR_DEPARTMENT_OTHER): Admission: EM | Admit: 2008-10-15 | Discharge: 2008-10-15 | Payer: Self-pay | Admitting: Emergency Medicine

## 2009-01-12 ENCOUNTER — Ambulatory Visit: Payer: Self-pay | Admitting: Internal Medicine

## 2009-01-12 DIAGNOSIS — K589 Irritable bowel syndrome without diarrhea: Secondary | ICD-10-CM | POA: Insufficient documentation

## 2009-01-12 DIAGNOSIS — K5900 Constipation, unspecified: Secondary | ICD-10-CM | POA: Insufficient documentation

## 2009-01-12 LAB — CONVERTED CEMR LAB: IgA: 92 mg/dL (ref 68–378)

## 2009-03-06 ENCOUNTER — Inpatient Hospital Stay (HOSPITAL_COMMUNITY): Admission: AD | Admit: 2009-03-06 | Discharge: 2009-03-06 | Payer: Self-pay | Admitting: Obstetrics and Gynecology

## 2009-03-25 ENCOUNTER — Encounter: Payer: Self-pay | Admitting: Endocrinology

## 2009-04-24 ENCOUNTER — Inpatient Hospital Stay (HOSPITAL_COMMUNITY): Admission: AD | Admit: 2009-04-24 | Discharge: 2009-04-24 | Payer: Self-pay | Admitting: Obstetrics and Gynecology

## 2009-06-16 ENCOUNTER — Encounter: Payer: Self-pay | Admitting: Internal Medicine

## 2009-07-14 ENCOUNTER — Inpatient Hospital Stay (HOSPITAL_COMMUNITY): Admission: AD | Admit: 2009-07-14 | Discharge: 2009-07-15 | Payer: Self-pay | Admitting: Obstetrics

## 2009-07-17 ENCOUNTER — Inpatient Hospital Stay (HOSPITAL_COMMUNITY): Admission: AD | Admit: 2009-07-17 | Discharge: 2009-07-17 | Payer: Self-pay | Admitting: Obstetrics and Gynecology

## 2009-07-28 ENCOUNTER — Inpatient Hospital Stay (HOSPITAL_COMMUNITY): Admission: AD | Admit: 2009-07-28 | Discharge: 2009-08-04 | Payer: Self-pay | Admitting: Obstetrics and Gynecology

## 2009-07-29 ENCOUNTER — Encounter: Payer: Self-pay | Admitting: Obstetrics and Gynecology

## 2009-07-31 ENCOUNTER — Encounter (INDEPENDENT_AMBULATORY_CARE_PROVIDER_SITE_OTHER): Payer: Self-pay | Admitting: Obstetrics and Gynecology

## 2009-08-05 ENCOUNTER — Encounter: Admission: RE | Admit: 2009-08-05 | Discharge: 2009-09-04 | Payer: Self-pay | Admitting: Obstetrics and Gynecology

## 2009-08-08 ENCOUNTER — Ambulatory Visit: Payer: Self-pay | Admitting: Cardiology

## 2009-08-08 ENCOUNTER — Inpatient Hospital Stay (HOSPITAL_COMMUNITY): Admission: AD | Admit: 2009-08-08 | Discharge: 2009-08-12 | Payer: Self-pay | Admitting: Obstetrics

## 2009-08-08 ENCOUNTER — Encounter (INDEPENDENT_AMBULATORY_CARE_PROVIDER_SITE_OTHER): Payer: Self-pay | Admitting: Obstetrics

## 2009-08-08 ENCOUNTER — Ambulatory Visit: Payer: Self-pay | Admitting: Vascular Surgery

## 2009-08-08 ENCOUNTER — Ambulatory Visit: Payer: Self-pay | Admitting: Internal Medicine

## 2009-08-10 ENCOUNTER — Encounter: Payer: Self-pay | Admitting: Internal Medicine

## 2009-08-26 ENCOUNTER — Encounter: Admission: RE | Admit: 2009-08-26 | Discharge: 2009-08-26 | Payer: Self-pay | Admitting: Cardiovascular Disease

## 2009-08-28 ENCOUNTER — Encounter: Admission: RE | Admit: 2009-08-28 | Discharge: 2009-08-28 | Payer: Self-pay | Admitting: Cardiovascular Disease

## 2009-09-05 ENCOUNTER — Encounter: Admission: RE | Admit: 2009-09-05 | Discharge: 2009-10-05 | Payer: Self-pay | Admitting: Obstetrics and Gynecology

## 2009-10-06 ENCOUNTER — Encounter: Admission: RE | Admit: 2009-10-06 | Discharge: 2009-11-05 | Payer: Self-pay | Admitting: Obstetrics and Gynecology

## 2009-11-06 ENCOUNTER — Encounter: Admission: RE | Admit: 2009-11-06 | Discharge: 2009-12-06 | Payer: Self-pay | Admitting: Obstetrics and Gynecology

## 2009-11-18 ENCOUNTER — Encounter: Payer: Self-pay | Admitting: Endocrinology

## 2009-12-07 ENCOUNTER — Encounter: Admission: RE | Admit: 2009-12-07 | Discharge: 2010-01-06 | Payer: Self-pay | Admitting: Obstetrics and Gynecology

## 2010-01-07 ENCOUNTER — Encounter: Admission: RE | Admit: 2010-01-07 | Discharge: 2010-02-06 | Payer: Self-pay | Admitting: Obstetrics and Gynecology

## 2010-01-12 ENCOUNTER — Ambulatory Visit: Payer: Self-pay | Admitting: Cardiovascular Disease

## 2010-01-25 ENCOUNTER — Encounter: Payer: Self-pay | Admitting: Endocrinology

## 2010-02-04 ENCOUNTER — Ambulatory Visit: Payer: Self-pay | Admitting: Cardiovascular Disease

## 2010-05-30 HISTORY — PX: INTERSTIM IMPLANT PLACEMENT: SHX5130

## 2010-06-14 ENCOUNTER — Emergency Department (HOSPITAL_BASED_OUTPATIENT_CLINIC_OR_DEPARTMENT_OTHER)
Admission: EM | Admit: 2010-06-14 | Discharge: 2010-06-14 | Payer: Self-pay | Source: Home / Self Care | Admitting: Emergency Medicine

## 2010-06-16 LAB — CBC
HCT: 40.3 % (ref 36.0–46.0)
Hemoglobin: 13.3 g/dL (ref 12.0–15.0)
MCH: 26.7 pg (ref 26.0–34.0)
MCHC: 33 g/dL (ref 30.0–36.0)
MCV: 80.8 fL (ref 78.0–100.0)
Platelets: 321 10*3/uL (ref 150–400)
RBC: 4.99 MIL/uL (ref 3.87–5.11)
RDW: 14.1 % (ref 11.5–15.5)
WBC: 8 10*3/uL (ref 4.0–10.5)

## 2010-06-16 LAB — BASIC METABOLIC PANEL
BUN: 15 mg/dL (ref 6–23)
CO2: 25 mEq/L (ref 19–32)
Calcium: 9.4 mg/dL (ref 8.4–10.5)
Chloride: 107 mEq/L (ref 96–112)
Creatinine, Ser: 0.6 mg/dL (ref 0.4–1.2)
GFR calc Af Amer: >60 mL/min (ref >60)
GFR calc non Af Amer: >60 mL/min (ref >60)
Glucose, Bld: 94 mg/dL (ref 70–99)
Potassium: 4.4 mEq/L (ref 3.5–5.1)
Sodium: 144 mEq/L (ref 135–145)

## 2010-06-16 LAB — URINE CULTURE
Colony Count: NO GROWTH
Culture  Setup Time: 201201161842
Culture: NO GROWTH

## 2010-06-16 LAB — URINALYSIS, ROUTINE W REFLEX MICROSCOPIC
Hgb urine dipstick: NEGATIVE
Ketones, ur: NEGATIVE mg/dL
Leukocytes, UA: NEGATIVE
Nitrite: NEGATIVE
Protein, ur: 30 mg/dL — AB
Specific Gravity, Urine: 1.03 (ref 1.005–1.030)
Urine Glucose, Fasting: NEGATIVE mg/dL
Urobilinogen, UA: 0.2 mg/dL (ref 0.0–1.0)
pH: 5.5 (ref 5.0–8.0)

## 2010-06-16 LAB — DIFFERENTIAL
Basophils Absolute: 0 10*3/uL (ref 0.0–0.1)
Basophils Relative: 0 % (ref 0–1)
Eosinophils Absolute: 0.1 10*3/uL (ref 0.0–0.7)
Eosinophils Relative: 1 % (ref 0–5)
Lymphocytes Relative: 30 % (ref 12–46)
Lymphs Abs: 2.4 10*3/uL (ref 0.7–4.0)
Monocytes Absolute: 0.5 10*3/uL (ref 0.1–1.0)
Monocytes Relative: 6 % (ref 3–12)
Neutro Abs: 5 10*3/uL (ref 1.7–7.7)
Neutrophils Relative %: 63 % (ref 43–77)

## 2010-06-16 LAB — URINE MICROSCOPIC-ADD ON

## 2010-06-16 LAB — PREGNANCY, URINE: Preg Test, Ur: NEGATIVE

## 2010-06-19 ENCOUNTER — Encounter: Payer: Self-pay | Admitting: *Deleted

## 2010-06-20 ENCOUNTER — Encounter: Payer: Self-pay | Admitting: *Deleted

## 2010-07-01 ENCOUNTER — Ambulatory Visit (INDEPENDENT_AMBULATORY_CARE_PROVIDER_SITE_OTHER): Payer: Medicare Other | Admitting: Cardiovascular Disease

## 2010-07-01 DIAGNOSIS — I10 Essential (primary) hypertension: Secondary | ICD-10-CM

## 2010-07-01 NOTE — Letter (Signed)
Summary: [facility]   Imported By: Lester Aurelia 02/08/2010 09:12:30  _____________________________________________________________________  External Attachment:    Type:   Image     Comment:   External Document

## 2010-07-01 NOTE — Letter (Signed)
Summary: [facility]  OV/[facility]   Imported By: Sherian Rein 06/18/2009 11:28:44  _____________________________________________________________________  External Attachment:    Type:   Image     Comment:   External Document

## 2010-07-01 NOTE — Letter (Signed)
Summary: Return Visit/[facility]  Return Visit/[facility]   Imported By: Sherian Rein 02/18/2010 10:40:39  _____________________________________________________________________  External Attachment:    Type:   Image     Comment:   External Document

## 2010-07-01 NOTE — Letter (Signed)
Summary: [facility]   Imported By: Lester Schulenburg 12/03/2009 09:56:43  _____________________________________________________________________  External Attachment:    Type:   Image     Comment:   External Document

## 2010-07-15 ENCOUNTER — Ambulatory Visit: Payer: Self-pay | Admitting: Cardiovascular Disease

## 2010-07-20 ENCOUNTER — Other Ambulatory Visit: Payer: Self-pay | Admitting: Obstetrics and Gynecology

## 2010-07-21 ENCOUNTER — Emergency Department (HOSPITAL_BASED_OUTPATIENT_CLINIC_OR_DEPARTMENT_OTHER)
Admission: EM | Admit: 2010-07-21 | Discharge: 2010-07-22 | Disposition: A | Discharge status: Home / Self Care | Payer: 59 | Attending: Emergency Medicine | Admitting: Emergency Medicine

## 2010-07-21 ENCOUNTER — Emergency Department (HOSPITAL_BASED_OUTPATIENT_CLINIC_OR_DEPARTMENT_OTHER): Payer: 59

## 2010-07-21 DIAGNOSIS — N200 Calculus of kidney: Secondary | ICD-10-CM | POA: Insufficient documentation

## 2010-07-21 DIAGNOSIS — M545 Low back pain, unspecified: Secondary | ICD-10-CM | POA: Insufficient documentation

## 2010-07-21 DIAGNOSIS — R109 Unspecified abdominal pain: Secondary | ICD-10-CM

## 2010-07-21 DIAGNOSIS — I10 Essential (primary) hypertension: Secondary | ICD-10-CM | POA: Insufficient documentation

## 2010-07-21 DIAGNOSIS — N3010 Interstitial cystitis (chronic) without hematuria: Secondary | ICD-10-CM | POA: Insufficient documentation

## 2010-07-21 DIAGNOSIS — M5380 Other specified dorsopathies, site unspecified: Secondary | ICD-10-CM | POA: Insufficient documentation

## 2010-07-21 LAB — URINE MICROSCOPIC-ADD ON

## 2010-07-21 LAB — DIFFERENTIAL
Basophils Relative: 0 % (ref 0–1)
Eosinophils Relative: 2 % (ref 0–5)
Lymphocytes Relative: 25 % (ref 12–46)
Monocytes Absolute: 0.6 10*3/uL (ref 0.1–1.0)
Monocytes Relative: 6 % (ref 3–12)
Neutro Abs: 7.3 10*3/uL (ref 1.7–7.7)

## 2010-07-21 LAB — BASIC METABOLIC PANEL
CO2: 24 mEq/L (ref 19–32)
Calcium: 9.4 mg/dL (ref 8.4–10.5)
Chloride: 108 mEq/L (ref 96–112)
GFR calc Af Amer: >60 mL/min (ref >60)
Sodium: 142 mEq/L (ref 135–145)

## 2010-07-21 LAB — CBC
HCT: 39.8 % (ref 36.0–46.0)
Hemoglobin: 13.5 g/dL (ref 12.0–15.0)
MCH: 27.7 pg (ref 26.0–34.0)
MCHC: 33.9 g/dL (ref 30.0–36.0)
RDW: 14.2 % (ref 11.5–15.5)

## 2010-07-21 LAB — URINALYSIS, ROUTINE W REFLEX MICROSCOPIC
Bilirubin Urine: NEGATIVE
Hgb urine dipstick: NEGATIVE
Protein, ur: 30 mg/dL — AB
Urobilinogen, UA: 1 mg/dL (ref 0.0–1.0)

## 2010-08-18 LAB — URINE CULTURE: Colony Count: 3000

## 2010-08-18 LAB — CBC
Hemoglobin: 10.1 g/dL — ABNORMAL LOW (ref 12.0–15.0)
MCHC: 33.8 g/dL (ref 30.0–36.0)
RBC: 3.6 MIL/uL — ABNORMAL LOW (ref 3.87–5.11)
WBC: 8.9 10*3/uL (ref 4.0–10.5)

## 2010-08-18 LAB — COMPREHENSIVE METABOLIC PANEL
ALT: 13 U/L (ref 0–35)
Alkaline Phosphatase: 125 U/L — ABNORMAL HIGH (ref 39–117)
CO2: 24 mEq/L (ref 19–32)
Calcium: 9 mg/dL (ref 8.4–10.5)
Chloride: 105 mEq/L (ref 96–112)
GFR calc non Af Amer: >60 mL/min (ref >60)
Glucose, Bld: 90 mg/dL (ref 70–99)
Sodium: 134 mEq/L — ABNORMAL LOW (ref 135–145)
Total Bilirubin: 0.5 mg/dL (ref 0.3–1.2)

## 2010-08-18 LAB — URINALYSIS, ROUTINE W REFLEX MICROSCOPIC
Bilirubin Urine: NEGATIVE
Glucose, UA: NEGATIVE mg/dL
Hgb urine dipstick: NEGATIVE
Ketones, ur: NEGATIVE mg/dL
Protein, ur: NEGATIVE mg/dL
pH: 5.5 (ref 5.0–8.0)

## 2010-08-18 LAB — FETAL FIBRONECTIN: Fetal Fibronectin: NEGATIVE

## 2010-08-18 LAB — LACTATE DEHYDROGENASE: LDH: 165 U/L (ref 94–250)

## 2010-08-23 LAB — RH IMMUNE GLOB WKUP(>/=20WKS)(NOT WOMEN'S HOSP): Fetal Screen: NEGATIVE

## 2010-08-23 LAB — CBC
HCT: 23 % — ABNORMAL LOW (ref 36.0–46.0)
HCT: 23.3 % — ABNORMAL LOW (ref 36.0–46.0)
HCT: 28.1 % — ABNORMAL LOW (ref 36.0–46.0)
HCT: 28.6 % — ABNORMAL LOW (ref 36.0–46.0)
Hemoglobin: 7.2 g/dL — ABNORMAL LOW (ref 12.0–15.0)
Hemoglobin: 7.3 g/dL — ABNORMAL LOW (ref 12.0–15.0)
Hemoglobin: 7.7 g/dL — ABNORMAL LOW (ref 12.0–15.0)
Hemoglobin: 9.4 g/dL — ABNORMAL LOW (ref 12.0–15.0)
MCHC: 31.4 g/dL (ref 30.0–36.0)
MCHC: 32.9 g/dL (ref 30.0–36.0)
MCHC: 33 g/dL (ref 30.0–36.0)
MCV: 83.1 fL (ref 78.0–100.0)
MCV: 83.8 fL (ref 78.0–100.0)
MCV: 83.9 fL (ref 78.0–100.0)
MCV: 85.8 fL (ref 78.0–100.0)
MCV: 86.2 fL (ref 78.0–100.0)
MCV: 86.7 fL (ref 78.0–100.0)
Platelets: 161 10*3/uL (ref 150–400)
Platelets: 529 10*3/uL — ABNORMAL HIGH (ref 150–400)
Platelets: 619 10*3/uL — ABNORMAL HIGH (ref 150–400)
RBC: 2.66 MIL/uL — ABNORMAL LOW (ref 3.87–5.11)
RBC: 2.78 MIL/uL — ABNORMAL LOW (ref 3.87–5.11)
RBC: 3.06 MIL/uL — ABNORMAL LOW (ref 3.87–5.11)
RBC: 3.38 MIL/uL — ABNORMAL LOW (ref 3.87–5.11)
RBC: 3.91 MIL/uL (ref 3.87–5.11)
RDW: 15.6 % — ABNORMAL HIGH (ref 11.5–15.5)
RDW: 17.8 % — ABNORMAL HIGH (ref 11.5–15.5)
RDW: 18.4 % — ABNORMAL HIGH (ref 11.5–15.5)
WBC: 10.1 10*3/uL (ref 4.0–10.5)
WBC: 13.3 10*3/uL — ABNORMAL HIGH (ref 4.0–10.5)
WBC: 14.2 10*3/uL — ABNORMAL HIGH (ref 4.0–10.5)
WBC: 14.8 10*3/uL — ABNORMAL HIGH (ref 4.0–10.5)
WBC: 15.6 10*3/uL — ABNORMAL HIGH (ref 4.0–10.5)
WBC: 8.1 10*3/uL (ref 4.0–10.5)

## 2010-08-23 LAB — MAGNESIUM: Magnesium: 1.6 mg/dL (ref 1.5–2.5)

## 2010-08-23 LAB — COMPREHENSIVE METABOLIC PANEL
ALT: 12 U/L (ref 0–35)
ALT: 13 U/L (ref 0–35)
ALT: 14 U/L (ref 0–35)
ALT: 19 U/L (ref 0–35)
AST: 27 U/L (ref 0–37)
AST: 27 U/L (ref 0–37)
AST: 33 U/L (ref 0–37)
Albumin: 1.7 g/dL — ABNORMAL LOW (ref 3.5–5.2)
Albumin: 2 g/dL — ABNORMAL LOW (ref 3.5–5.2)
Albumin: 2.1 g/dL — ABNORMAL LOW (ref 3.5–5.2)
Alkaline Phosphatase: 128 U/L — ABNORMAL HIGH (ref 39–117)
Alkaline Phosphatase: 92 U/L (ref 39–117)
BUN: 10 mg/dL (ref 6–23)
BUN: 14 mg/dL (ref 6–23)
BUN: 17 mg/dL (ref 6–23)
BUN: 7 mg/dL (ref 6–23)
CO2: 20 mEq/L (ref 19–32)
CO2: 21 mEq/L (ref 19–32)
CO2: 26 mEq/L (ref 19–32)
Calcium: 7.2 mg/dL — ABNORMAL LOW (ref 8.4–10.5)
Calcium: 8.3 mg/dL — ABNORMAL LOW (ref 8.4–10.5)
Calcium: 8.5 mg/dL (ref 8.4–10.5)
Calcium: 8.9 mg/dL (ref 8.4–10.5)
Chloride: 102 mEq/L (ref 96–112)
Chloride: 108 mEq/L (ref 96–112)
Chloride: 109 mEq/L (ref 96–112)
Chloride: 110 mEq/L (ref 96–112)
Creatinine, Ser: 0.66 mg/dL (ref 0.4–1.2)
Creatinine, Ser: 0.72 mg/dL (ref 0.4–1.2)
Creatinine, Ser: 0.72 mg/dL (ref 0.4–1.2)
Creatinine, Ser: 0.75 mg/dL (ref 0.4–1.2)
GFR calc Af Amer: >60 mL/min (ref >60)
GFR calc Af Amer: >60 mL/min (ref >60)
GFR calc non Af Amer: >60 mL/min (ref >60)
GFR calc non Af Amer: >60 mL/min (ref >60)
Glucose, Bld: 121 mg/dL — ABNORMAL HIGH (ref 70–99)
Glucose, Bld: 76 mg/dL (ref 70–99)
Glucose, Bld: 80 mg/dL (ref 70–99)
Glucose, Bld: 82 mg/dL (ref 70–99)
Potassium: 3.7 mEq/L (ref 3.5–5.1)
Potassium: 3.7 mEq/L (ref 3.5–5.1)
Potassium: 4.2 mEq/L (ref 3.5–5.1)
Potassium: 4.2 mEq/L (ref 3.5–5.1)
Sodium: 135 mEq/L (ref 135–145)
Sodium: 136 mEq/L (ref 135–145)
Sodium: 139 mEq/L (ref 135–145)
Total Bilirubin: 0.4 mg/dL (ref 0.3–1.2)
Total Bilirubin: 0.6 mg/dL (ref 0.3–1.2)
Total Bilirubin: 0.8 mg/dL (ref 0.3–1.2)
Total Protein: 3.8 g/dL — ABNORMAL LOW (ref 6.0–8.3)
Total Protein: 3.9 g/dL — ABNORMAL LOW (ref 6.0–8.3)
Total Protein: 4 g/dL — ABNORMAL LOW (ref 6.0–8.3)
Total Protein: 4.4 g/dL — ABNORMAL LOW (ref 6.0–8.3)
Total Protein: 4.7 g/dL — ABNORMAL LOW (ref 6.0–8.3)

## 2010-08-23 LAB — DIFFERENTIAL
Eosinophils Absolute: 0.1 10*3/uL (ref 0.0–0.7)
Lymphocytes Relative: 14 % (ref 12–46)
Lymphs Abs: 1.9 10*3/uL (ref 0.7–4.0)
Lymphs Abs: 2.7 10*3/uL (ref 0.7–4.0)
Monocytes Relative: 5 % (ref 3–12)
Monocytes Relative: 6 % (ref 3–12)
Neutro Abs: 10.5 10*3/uL — ABNORMAL HIGH (ref 1.7–7.7)
Neutro Abs: 10.9 10*3/uL — ABNORMAL HIGH (ref 1.7–7.7)
Neutrophils Relative %: 74 % (ref 43–77)
Neutrophils Relative %: 80 % — ABNORMAL HIGH (ref 43–77)

## 2010-08-23 LAB — TYPE AND SCREEN: ABO/RH(D): O NEG

## 2010-08-23 LAB — BASIC METABOLIC PANEL
BUN: 12 mg/dL (ref 6–23)
Calcium: 8.6 mg/dL (ref 8.4–10.5)
Creatinine, Ser: 0.7 mg/dL (ref 0.4–1.2)
GFR calc Af Amer: >60 mL/min (ref >60)
GFR calc non Af Amer: >60 mL/min (ref >60)

## 2010-08-23 LAB — LACTATE DEHYDROGENASE
LDH: 162 U/L (ref 94–250)
LDH: 346 U/L — ABNORMAL HIGH (ref 94–250)

## 2010-08-23 LAB — PHOSPHORUS: Phosphorus: 3.8 mg/dL (ref 2.3–4.6)

## 2010-08-23 LAB — URIC ACID: Uric Acid, Serum: 6.9 mg/dL (ref 2.4–7.0)

## 2010-08-23 LAB — BRAIN NATRIURETIC PEPTIDE
Pro B Natriuretic peptide (BNP): 1075 pg/mL — ABNORMAL HIGH (ref 0.0–100.0)
Pro B Natriuretic peptide (BNP): 596 pg/mL — ABNORMAL HIGH (ref 0.0–100.0)

## 2010-08-23 LAB — D-DIMER, QUANTITATIVE: D-Dimer, Quant: 7.48 ug/mL-FEU — ABNORMAL HIGH (ref 0.00–0.48)

## 2010-09-01 LAB — URINALYSIS, ROUTINE W REFLEX MICROSCOPIC
Bilirubin Urine: NEGATIVE
Ketones, ur: NEGATIVE mg/dL
Leukocytes, UA: NEGATIVE
Nitrite: NEGATIVE
Specific Gravity, Urine: 1.025 (ref 1.005–1.030)
Urobilinogen, UA: 0.2 mg/dL (ref 0.0–1.0)
pH: 6 (ref 5.0–8.0)

## 2010-09-01 LAB — URINE CULTURE

## 2010-09-01 LAB — CBC
MCHC: 34.1 g/dL (ref 30.0–36.0)
MCV: 90.5 fL (ref 78.0–100.0)
Platelets: 265 10*3/uL (ref 150–400)
RDW: 14 % (ref 11.5–15.5)
WBC: 13.5 10*3/uL — ABNORMAL HIGH (ref 4.0–10.5)

## 2010-09-01 LAB — DIFFERENTIAL
Basophils Absolute: 0 10*3/uL (ref 0.0–0.1)
Basophils Relative: 0 % (ref 0–1)
Eosinophils Absolute: 0.1 10*3/uL (ref 0.0–0.7)
Lymphs Abs: 2.4 10*3/uL (ref 0.7–4.0)
Neutrophils Relative %: 76 % (ref 43–77)

## 2010-09-01 LAB — URINE MICROSCOPIC-ADD ON

## 2010-09-02 LAB — URINALYSIS, ROUTINE W REFLEX MICROSCOPIC
Bilirubin Urine: NEGATIVE
Glucose, UA: NEGATIVE mg/dL
Hgb urine dipstick: NEGATIVE
Ketones, ur: NEGATIVE mg/dL
pH: 6.5 (ref 5.0–8.0)

## 2010-09-09 ENCOUNTER — Other Ambulatory Visit: Payer: Self-pay | Admitting: Cardiovascular Disease

## 2010-09-09 DIAGNOSIS — I10 Essential (primary) hypertension: Secondary | ICD-10-CM

## 2010-09-09 LAB — DIFFERENTIAL
Eosinophils Absolute: 0.4 10*3/uL (ref 0.0–0.7)
Lymphocytes Relative: 41 % (ref 12–46)
Lymphs Abs: 3.5 10*3/uL (ref 0.7–4.0)
Monocytes Relative: 7 % (ref 3–12)
Neutrophils Relative %: 47 % (ref 43–77)

## 2010-09-09 LAB — POCT I-STAT, CHEM 8
BUN: 9 mg/dL (ref 6–23)
Creatinine, Ser: 0.8 mg/dL (ref 0.4–1.2)
Glucose, Bld: 90 mg/dL (ref 70–99)
Hemoglobin: 12.9 g/dL (ref 12.0–15.0)
Potassium: 2.8 mEq/L — ABNORMAL LOW (ref 3.5–5.1)
Sodium: 136 mEq/L (ref 135–145)

## 2010-09-09 LAB — URINALYSIS, ROUTINE W REFLEX MICROSCOPIC
Bilirubin Urine: NEGATIVE
Hgb urine dipstick: NEGATIVE
Specific Gravity, Urine: 1.02 (ref 1.005–1.030)
pH: 6 (ref 5.0–8.0)

## 2010-09-09 LAB — POCT PREGNANCY, URINE: Preg Test, Ur: NEGATIVE

## 2010-09-09 LAB — CBC
HCT: 36.7 % (ref 36.0–46.0)
MCV: 88.9 fL (ref 78.0–100.0)
RBC: 4.13 MIL/uL (ref 3.87–5.11)
WBC: 8.6 10*3/uL (ref 4.0–10.5)

## 2010-09-09 MED ORDER — DILTIAZEM HCL ER COATED BEADS 240 MG PO CP24: 240.0000 mg | ORAL_CAPSULE | Freq: Every day | ORAL | Status: DC

## 2010-09-09 NOTE — Telephone Encounter (Signed)
Wants her Cartia 240mg  called in to [facility]90d Pls.call patient to confirm

## 2010-09-09 NOTE — Telephone Encounter (Signed)
Patient request refill. Done and pt called. Alfonso Ramus RN

## 2010-10-12 NOTE — H&P (Signed)
NAMEEVERLEAN, BUCHER             ACCOUNT NO.:  [id number]   MEDICAL RECORD NO.:  [id number]          PATIENT TYPE:  AMB   LOCATION:  [facility]                         FACILITY:  [facility]   PHYSICIAN:  Lenoard Aden, M.D.DATE OF BIRTH:  Aug 04, 1970   DATE OF ADMISSION:  01/04/2008  DATE OF DISCHARGE:  01/04/2008                              HISTORY & PHYSICAL   CHIEF COMPLAINT:  Contractions.   She is a 40 year old white female G2, P1 with a history of preterm birth  now 35 plus weeks with a history of lower abdominal pain, decreased  fetal movement, and questionable bloody mucus passages per vagina today.   ALLERGIES:  She has no known drug allergies.   MEDICATIONS:  Prenatal vitamins and 17-hydroxyprogesterone.   SOCIAL HISTORY:  She is a nonsmoker, nondrinker.  She denies domestic or  physical violence.   FAMILY HISTORY:  She has a family history of kidney stones or migraine  headaches, rheumatoid arthritis, cancer, stroke, hypertension.   PREGNANCY HISTORY:  She has a previous pregnancy history of preterm  birth at 57 weeks.  Current pregnancy has been complicated by preterm  cervical change.  She has received betamethasone.  She is currently on  bedrest with moderate physical activity.   PHYSICAL EXAMINATION:  VITAL SIGNS:  Stable.  She is afebrile.  GENERAL:  She is a well-developed, well-nourished white female in no  acute distress.  HEENT:  Normal.  NECK:  Supple.  Full range of motion.  LUNGS:  Clear to auscultation.  HEART:  Regular rate and rhythm.  ABDOMEN:  Soft, gravid, nontender.  Cervix is 2-3 cm, 3 cm long, vertex  -1.  EXTREMITIES:  No cords.  NEUROLOGIC:  Nonfocal.  SKIN:  Intact.   NST is reactive with fetal heart tones in the 130-150 beats per minute  range with accelerations, no decelerations.  Rare contractions noted.   IMPRESSION:  1. A 34-week OB.  2. Preterm cervical change stable with reassuring fetal heart rate      status.   PLAN:   Discharge home.  Preterm labor warnings given.      Lenoard Aden, M.D.  Electronically Signed     RJT/MEDQ  D:  03/16/2008  T:  03/17/2008  Job:  [id number]

## 2010-10-12 NOTE — Op Note (Signed)
[name], [name]             ACCOUNT NO.:  [id number]   MEDICAL RECORD NO.:  [id number]          PATIENT TYPE:  AMB   LOCATION:  [facility]                         FACILITY:  [facility]   PHYSICIAN:  Jamison Neighbor, M.D.  DATE OF BIRTH:  [birth year]   DATE OF PROCEDURE:  04/10/2007  DATE OF DISCHARGE:                               OPERATIVE REPORT   PREOPERATIVE DIAGNOSES:  1. Interstitial cystitis.  2. Urgency incontinence.   POSTOPERATIVE DIAGNOSES:  1. Interstitial cystitis.  2. Urgency incontinence.   PROCEDURES:  1. Cystoscopy.  2. Urethral calibration.  3. Hydrodistention of the bladder.  4. Marcaine and Pyridium instillation.  5. Marcaine and Kenalog injection.  6. Botox injection.   SURGEON:  Jamison Neighbor, M.D.   ANESTHESIA:  General.   COMPLICATIONS:  None.   DRAINS:  None.   BRIEF HISTORY:  This 40 year old female has a very complicated history.  She has chronic pelvic pain felt to be due to interstitial cystitis.  She has previously respond to hydrodistentions and would like to have  another one done.  She has marked urgency and frequency despite the use  of medications as well as the use of an InterStim.  She has known GI  problems.  There is a question about possible pudendal nerve injury.  She has ongoing issues with depression.  She has multiple medications.  The patient has been concerned enough about her bladder and bowel  problems to actually contemplate having cystectomy and/or partial  colectomy performed but we have endeavored to try to have her be more  conservator in her outlook.  At her last visit we made some medication  changes.  We tried reprogramming her InterStim and we are now going to  do a cystoscopy and hydrodistention to decrease her use of medications.  Hopefully, this will allow her to have better control her frequency and  use less medications, which might help with her chronic constipation.  She understands the risks and benefits of  the procedure and gave full  informed consent.   PROCEDURE:  After successful induction of general anesthesia, the  patient was placed in the dorsal lithotomy position, prepped with  Betadine and draped in the usual sterile fashion.  Careful bimanual  examination revealed an unremarkable urethra with no signs of a  diverticulum.  The urethra was well-supported.  There were no signs  prolapse visibly, no cystocele, rectocele or enterocele.  The uterus was  palpably normal.  The urethra was calibrated to a 32-French with female  urethral sounds with no signs of stenosis or stricture.  The cystoscope  was inserted and the bladder was carefully inspected.  No tumors or  stones could be seen.  Both ureteral orifices were normal in  configuration and location.  Hydrodistention of the bladder was  performed.  The bladder was drained at a pressure of 100 cm of water for  5 minutes.  When the bladder was drained, glomerulations could be seen  throughout the bladder but they were relatively modest and much less  than what we have seen previously.  The bladder capacity was  approximately 650 mL.  Following completion of the hydrodistention, the  patient underwent Botox injection with a total of 2 ampules in divided  dosages spread throughout the bladder including the trigone.  The  patient tolerated this without difficulty.  At the end of procedure,  Marcaine and Pyridium were left within the bladder, Marcaine and Kenalog  were injected periurethrally.  She received intraoperative Toradol and  Zofran as well as a B&O suppository.  She already has pain medication  ,  so no additional pain medicine will be given to her.  She has antibiotic  prophylaxis with trimethoprim, which she takes one daily, and she has  Pyridium as urinary analgesia so no new prescriptions will be required.      Jamison Neighbor, M.D.  Electronically Signed     RJE/MEDQ  D:  04/10/2007  T:  04/10/2007  Job:  [id number]

## 2010-10-12 NOTE — Op Note (Signed)
[name], [name]             ACCOUNT NO.:  [id number]   MEDICAL RECORD NO.:  [id number]          PATIENT TYPE:  OIB   LOCATION:  0098                         FACILITY:  [facility]   PHYSICIAN:  Jamison Neighbor, M.D.  DATE OF BIRTH:  September 23, 1970   DATE OF PROCEDURE:  10/12/2007  DATE OF DISCHARGE:                               OPERATIVE REPORT   PREOPERATIVE DIAGNOSIS:  Right flank pain.   POSTOPERATIVE DIAGNOSIS:  1. Right flank pain with moderate hydronephrosis.  2. Left upper pole caliceal calculus.   PROCEDURE:  Cystoscopy, bilateral retrogrades, right double-J catheter  insertion.   SURGEON:  Marcelyn Bruins, MD.   ANESTHESIA:  General.   COMPLICATIONS:  None.   DRAIN:  A 6-French x 26-cm double-J catheter.   BRIEF HISTORY:  This 40 year old female has a very complex urologic  history.  She does have a past history of interstitial cystitis that has  been very difficult to manage.  She has an InterStim implant in place to  try to control problems with frequency, urgency as well as retention.  She has also had associated bowel problems and had past problems with  gastroparesis.  Right now the patient presents with relatively acute  onset of severe right-sided flank pain.  The patient did appear to have  a case of acute colic when she was in my office.  I reviewed the CT scan  that was obtained last night in the emergency room which showed small  bilateral calculi but no clear-cut hydronephrosis.  There was a stone  that was seen in the general vicinity of the left kidney on a plain film  but nothing really large was noted on the right-hand side.  The patient,  however, really does appear to have intractable pain.  The CT scan shows  no other possible etiology.  For that reason, we are going to place a  double-J on that side to see if we can eliminate some of the patient's  pain.  She understands that there is no guarantee that unblocking the  right kidney is going to make a  difference.  She also understands the  double-J catheter my exacerbate her interstitial cystitis.  Full  informed consent was obtained.   PROCEDURE:  After successful induction of general anesthesia, the  patient was placed in the dorsal lithotomy position, prepped with  Betadine, and draped in the usual sterile fashion.  Careful bimanual  examination revealed no irregularities of the urethra.  The patient had  no prolapse.  There was no cystocele, rectocele or enterocele detected.  The uterus was palpably normal.  The cystoscope was inserted.  The  bladder was carefully inspected.  No tumors or stones could be seen.  Both ureteral orifices were normal in configuration and location.  There  was no bloody urine on either side.  The patient did not undergo a  hydrodistention, so no assessment on the interstitial cystitis was made.  A 6-French ureteral catheter was inserted into the right ureter, which  had no evidence of any obstruction.  The ureter was used to perform the  retrograde, which showed  a normal ureter and then a slightly dilated  collecting system possibly consistent with a mild UPJ obstruction.  The  ureter did not appear to have a real true acute cut off at the pelvis  but there was a slightly extrarenal pelvis and moderate dilation.  The  patient had no filling defects identified.  The guidewire was passed up  into the kidney and allowed to coil normally.  A 6-French x 26-cm double-  J catheter was then passed up into the kidney, where it coiled towards  the top of the pelvis, possibly standing up into a moderately dilated  upper pole.  The coil in the bladder was tried to be kept as small as  possible.  On the opposite side a retrograde study was performed.  The  retrograde study showed a normal ureter.  There were no signs of any  filling defect or obstruction in the ureter.  In the kidney there was a  caliceal stone that was noted be completely filling one of the calix  of  the upper pole.  There was a very tight infundibulum.  There really is  no way to treat this without endoscopy and it really would not make much  sense to try and treat this.  There was no other obstruction and it is  felt that the patient does not require a double-J.  The bladder was  drained.  The patient tolerated the procedure well and was taken to the  recovery room in good condition.      Jamison Neighbor, M.D.  Electronically Signed     RJE/MEDQ  D:  10/12/2007  T:  10/12/2007  Job:  [id number]

## 2010-10-12 NOTE — Op Note (Signed)
[name], [name]             ACCOUNT NO.:  [id number]   MEDICAL RECORD NO.:  [id number]          PATIENT TYPE:  AMB   LOCATION:  [facility]                         FAC[facility]  [facility]   PHYSICIAN:  Jamison Neighbor, M.D.  DATE OF BIRTH:  02-07-1971   DATE OF PROCEDURE:  04/15/2008  DATE OF DISCHARGE:                               OPERATIVE REPORT   PREOPERATIVE DIAGNOSIS:  Interstitial cystitis.   POSTOPERATIVE DIAGNOSIS:  Interstitial cystitis.   PROCEDURE:  Cystoscopy, urethral calibration, hydrodistention of the  bladder, Marcaine and Pyridium installation, Marcaine and Kenalog  injection.   SURGEON:  Jamison Neighbor, M.D.   ANESTHESIA:  General.   COMPLICATIONS:  None.   DRAINS:  None.   BRIEF HISTORY:  This 40 year old female is known to have interstitial  cystitis.  Her situation was complicated by the fact that she also has  irritable bowel syndrome, depression, bipolar, as well as chronic  fatigue syndrome and detrusor sphincter dyssynergia.  The patient does  have an InterStim in place.  Right now she is having problems with her  bladder and has requested a repeat hydrodistention be performed.  She  understands that there is no guarantee that she will have improvement to  what she has had in the past.  She gave full informed consent.   PROCEDURE:  After successful induction of general anesthesia the patient  was placed in the dorsal lithotomy position, prepped with Betadine and  draped in the usual sterile fashion.  Careful bimanual examination  revealed no evidence of cystocele, rectocele or enterocele.  There are  no masses on bimanual exam.  Urethra was palpably normal and no signs of  stenosis or stricture.  There was no evidence of diverticulum.  The  patient underwent urethral calibration up to a 32-French with female  urethral sounds.  The cystoscope was inserted.  The bladder was  carefully inspected.  No tumors or stones could be seen.  Both orifices  were  normal in configuration and location.  Hydrodistention of the  bladder was performed.  The bladder was distended at a pressure 100 cm  of water for 5 minutes.  When the bladder was drained glomerulation  could be seen throughout the bladder.  The bladder capacity of 600 mL.  It was quite comparable to the average IC bladder capacity of 575.  The  patient had nothing that required biopsy or cauterization.  Marcaine and  Pyridium were left in the bladder.  Marcaine and Kenalog were injected  periurethral.  The patient tolerated the procedure and was taken to the  recovery room in good condition.   She will be maintained on her current pain medication, as well as  Pyridium Plus, which she already has.  She will be sent home with  doxycycline and then return to her one a day antibiotic prophylaxis.      Jamison Neighbor, M.D.  Electronically Signed     RJE/MEDQ  D:  04/15/2008  T:  04/15/2008  Job:  [id number]

## 2010-10-12 NOTE — Assessment & Plan Note (Signed)
[facility]                         GASTROENTEROLOGY OFFICE NOTE   NAME:[name], [name]                    MRN:          [id number]  DATE:07/17/2007                            DOB:          1970-09-14    CHIEF COMPLAINT:  Chronic nausea.  She needs Phenergan refill.   Zakaria has not really been seen here in about a year.  She is managing  her bowel movements with an enema every 3-4 days.  She went to [facility], and they retested her with anorectal manometry and other testing,  and told her that she had pudendal nerve damage.  She is going through  biofeedback therapy and pelvic floor retraining with  Dr. Michel Harrow therapy team.  She needs a refill on chronic Phenergan which  she has to take four times a day to prevent nausea.  Her other  medications are listed and reviewed on the chart.   PAST MEDICAL HISTORY:  Reviewed and unchanged.   She is still on a lot of narcotics for chronic pain and numerous  medications.  I have prescribed her last Phenergan.   PHYSICAL EXAMINATION:  VITAL SIGNS:  Height 5 feet 3 inches, weight 137  pounds, pulse 88, blood pressure 110/80.   ASSESSMENT:  A woman with severe irritable bowel syndrome and chronic  functional gastrointestinal disturbances as well as pelvic floor  dysfunction.   PLAN:  I will refill her Phenergan 25 mg four times a day as needed.  She has been given a three month prescription with some refills.  She  will obtain followup with Dr. Logan Bores regarding her other therapy.     Iva Boop, MD,FACG  Electronically Signed    CEG/MedQ  DD: 07/17/2007  DT: 07/18/2007  Job #: [id number]   cc:   Jamison Neighbor, M.D.

## 2010-10-12 NOTE — H&P (Signed)
[name], [name]             ACCOUNT NO.:  [id number]   MEDICAL RECORD NO.:  [id number]          PATIENT TYPE:  INP   LOCATION:  3729                         FACILITY:  [facility]   PHYSICIAN:  Kela Millin, M.D.DATE OF BIRTH:  1971-04-18   DATE OF ADMISSION:  11/09/2007  DATE OF DISCHARGE:                              HISTORY & PHYSICAL   CHIEF COMPLAINT:  Nausea and vomiting and per primary care physician  potassium of 2.9 and orthostatic at the office.   HISTORY OF PRESENT ILLNESS:  The patient is a 40 year old white female  with past medical history significant for chronic pain syndrome with  methadone dependent, interstitial cystitis with E-Stim device,  migraines, fibromyalgia, bipolar disorder, seizure disorder, history of  pelvic floor dysfunction, history of narcotic dependence, history of  chronic severe constipation, and gastric ulcer in 2007 who presents with  the above complaints.  She states that she has had intermittent nausea  and vomiting becoming more frequent in the past several days, and she  followed up with her primary care physician today and was found to have  a potassium of 2.7.  She had orthostatic vital signs done and per Dr.  Para March was found to be orthostatic and is directly admitted for further  evaluation and management.  As already noted, she has a history of  chronic constipation and states that she last had a small bowel movement  2 days ago after she had an enema.  She denies fevers, cough, dysuria,  melena, and no hematochezia.  She admits to upper abdominal discomfort  as well as a burning sensation in her throat and states that she was  suppose to be on Nexium b.i.d. for this, but she ran out 2 months ago  and has not had any.   PAST MEDICAL HISTORY:  As above.   MEDICATIONS:  1. Methadone 10 mg q.6h. p.r.n.  2. Bladder cocktail p.r.n.  3. Zofran 4 mg p.o. p.r.n.  4. Phenergan 25 mg p.o. q.6h. p.r.n.  5. Hydroxyzine 75 mg every night  p.r.n.  6. Seroquel 50 mg p.o. every night.  7. Topamax 100 mg p.o. every night.  8. Equetro 400 mg p.o. at noon.  9. Dyazide 37.5 mg every night.  10.Loestrin 24 mg daily.  11.Lamictal 150 mg p.o. daily.  12.Verapamil ER 120 mg p.o. daily.  13.Nexium 40 mg p.o. b.i.d. - has been out for the past 2 months.  14.Nasacort 2 sprays daily.  15.Maxalt p.r.n.  16.Cymbalta 60 mg p.o. daily.  17.Diazepam 5 mg p.o. p.r.n.  18.Skelaxin 800 mg p.o. q.8h. p.r.n.  19.Trazodone 50 mg p.o. every night.  20.Lidoderm patch subcutaneous q.12h. p.r.n.  21.Vitamin C weekly.   ALLERGIES/INTOLERANCES:  FENTANYL, DEMEROL, LYRICA, AMBIEN, MIRALAX,  DULCOLAX, SEAFOOD AND SHELLFISH.   SOCIAL HISTORY:  She denies tobacco, also denies alcohol.   FAMILY HISTORY:  Her dad has hypertension and diabetes.  Mother history  of MIs and heart disease.  Brother deceased age 1 of an MI.   REVIEW OF SYSTEMS:  As per HPI, other review of systems negative.   PHYSICAL EXAMINATION:  In general,  the patient is a thin-appearing,  middle-aged, white female in no apparent distress.  VITAL SIGNS:  Her temperature is 98.3 with a pulse of 83, respiratory  rate of 18, blood pressure lying is 105/83 (with a pulse of 83).  Her  blood pressure sitting is 97/72 with a pulse of 86, and her blood  pressure standing is 105/79 with a pulse 494.  HEENT:  PERRL, EOMI, slightly dry mucous membranes, no oral exudates.  LUNGS:  Clear to auscultation bilaterally, no crackles and no wheezes.  CARDIOVASCULAR:  Regular rate and rhythm.  Normal S1-S2.  ABDOMEN:  Soft, bowel sounds present, nontender and nondistended.  No  organomegaly and no masses palpable.  NEUROLOGICAL:  Alert and oriented x3.  Cranial nerves II-XII grossly  intact.  Nonfocal exam.   LABORATORY DATA:  Her sodium is 139 with a potassium of 2.9, chloride of  101, CO2 of 27, glucose 89, BUN of 9, creatinine of 1.04, total  bilirubin is 0.8, alkaline phosphatase 71, SGOT 31,  SGPT of 25, total  protein of 6.5, albumin of 4.0, calcium of 9.5.   ASSESSMENT AND PLAN:  1. Hypokalemia - will replace potassium, monitor on telemetry and      follow.  2. Volume depletion - as discussed above.  The patient with nausea and      vomiting and orthostatic at the office.  Will hydrate with IV      fluids, follow and recheck.  3. Chronic abdominal pain/chronic pain syndrome - as discussed above.      She has a history of gastric ulcers, and she has been out of Nexium      for the past 2 months, will start on Protonix as well as      antiemetics.  Continue pain management.  Will also obtain a      urinalysis to evaluate for other possible causes of her nausea and      vomiting.  Follow CBC, follow and consider a GI consultation - the      patient followed by Dr. Leone Payor.  Will also obtain abdominal series      and follow.  4. History of methadone dependence - continue her outpatient      medications.  5. History of interstitial cystitis with E-Stim device - followed by      urology.  6. Fibromyalgia - continue outpatient medications.  7. History of migraines - continue outpatient medications.  8. Chronic constipation - abdominal series as above, enemas as      appropriate and follow.  9. History of bipolar disorder - continue outpatient medications.  10.History of seizure disorder - continue outpatient medications.      Kela Millin, M.D.  Electronically Signed     ACV/MEDQ  D:  11/09/2007  T:  11/09/2007  Job:  [id number]   cc:   S. Para March, M.D.  Iva Boop, MD,FACG

## 2010-10-12 NOTE — Op Note (Signed)
[name], [name]             ACCOUNT NO.:  [id number]   MEDICAL RECORD NO.:  [id number]          PATIENT TYPE:  AMB   LOCATION:  [facility]C                         FACILITY:  [facility]   PHYSICIAN:  Jamison Neighbor, M.D.  DATE OF BIRTH:  04/11/1971   DATE OF PROCEDURE:  01/04/2008  DATE OF DISCHARGE:                               OPERATIVE REPORT   PREOPERATIVE DIAGNOSIS:  Malfunction of InterStim.   POSTOPERATIVE DIAGNOSIS:  Malfunction of InterStim.   PROCEDURE:  Replacement of bilateral Synergy model InterStim with new  IPG2 (first and second stage).   SURGEON:  Jamison Neighbor, M.D.   ANESTHESIA:  Local with IV sedation.   COMPLICATIONS:  None.   DRAINS:  A Foley catheter removed postoperatively.   HISTORY:  This 40 year old female has a long problem with urgency and  urgency incontinence and responded well to an InterStim.  She has a  bilateral Synergy implant done, which is implanted at the time of the  thought that bilateral stimulation would improve her options.  The  patient has only  used one side, and has had some complication and  problems with this large pulse generator.  The patient has had problems  recently with the device not working and she needs to have this  replaced.  She would like to have the much smaller IPG2.  She does know  this will only allow for 1 lead to be placed.  We did inform her that we  would attempt to try to keep some of the original first stage wires if  possible, but they might removal and replace with a new lead.  Her  preference is to have all the wires placed.  She understands the risks  and benefits of the procedure and gave full informed consent.   PROCEDURE IN DETAIL:  After successful induction of adequate IV  sedation, the patient was placed in the prone position.  She was  scrubbed for 10 minutes with both scrub and paint.  She was sutured to  the bed with tape to pull the buttocks apart to expose the rectal  sphincter.  The  entire operative field was covered with a Vi-drape.  Local anesthesia was infiltrated into the area over the previously  placed IPG.  An incision was made down through the old scar until the  IPG was identified.  The IPG along with the leads were dissected out  with electrocautery.  Attempts at removing the leads from the sacrum  were unsuccessful, as they were quite stuck and for that reason a  counter incision had to be made.  A second incision was made in the  midline and with fluoroscopic guidance, both the original first stage  leads were identified and removed.  Fluoroscopy is used to identify the  level of the 3rd sacral foramen and a search needle was placed into the  lead on each side.  The stimulation showed good dorsiflexion of the  great toe and normal Bellows effect on the sphincter bilaterally.  It  did appear, however, that the angle of the lead and the response were  better  on the right-hand side.  For that reason that foramen needle was  utilized.  A guidewire was passed down to the foramen needle, which was  removed.  The trocar was then used to dilate a tract.  The quadripolar  lead was then placed down through the trocar.  It was positioned  appropriately with fluoroscopy.  In the appropriate position, the  stimulations of the lead showed that there was dorsiflexion of the great  toe only to 0, 1 and 2, along with a bellows effect.  The protective  sheath was removed.  The area was checked once again and was checked a  third time after the lead had been pulled across to the lateral  incision.  Fluoroscopy showed that the leads had not moved and appeared  to be well secured.  Extra care was taken to be very careful with the  lead because with the midline incision, the tines were not as well  secured as normal, but after closure they appeared to be well secured.  The midline incision was closed in layers with 2-0 Vicryl after the  entire area was irrigated with antibiotic  solution.  The old pocket from  the previous Synergy implant was closed over with a 2-0 Vicryl.  A new  pocket was created a little bit superiorly through the same incision.  The IPG was then closed down within this new pocket with 2-0 Vicryl.  The entire area was irrigated, both before and after placement.  The  impedance test showed that everything appeared to be working normally.  The skin was closed with surgical clips.  The patient tolerated the  procedure well and was taken to recovery room in good condition.      Jamison Neighbor, M.D.  Electronically Signed     RJE/MEDQ  D:  01/04/2008  T:  01/04/2008  Job:  [id number]

## 2010-10-15 NOTE — Assessment & Plan Note (Signed)
[facility]                         GASTROENTEROLOGY OFFICE NOTE   NAME:[name] [name] BETTY                    MRN:          [id number]  DATE:06/27/2006                            DOB:          January 22, 1971    CHIEF COMPLAINT:  Followup of the irritable bowel, pelvic floor  dysfunction, and constipation.   [name] says she is doing okay.  She is here with her husband.  However, she really continues with constipation.  She has tried the  enema with a bag and a catheter but has developed problems with the  fluid not emptying from the enema catheter recently and is puzzled.  She  had been emptying out weekly, I think her husband has been assisting  with her enema each weekend.  For the last couple of weeks, that has not  gone well as noted.  They got a new tube.  There is no kink.  There is  no clogging.  She is asking if a fall and fractured coccyx would lead to  these problems as she relates her constipation problems started after  that.  It took about 8 months to discover that her pelvic floor  stimulator was fractured after that as well.  I told her possibly but I  really could not prove that with my diagnostic techniques.  She did not  return to Dr. Jacqulyn Bath at [facility].   MEDICATIONS:  Listed and reviewed in the chart.   Weight 140 pounds, pulse 90, blood pressure 110/70.   ASSESSMENT:  Continued pelvic floor dysfunction, chronic functional  constipation problems.  It is probably an outlet problem.  She is asking  about a possible colostomy.  I told her that was 1 possible option but  we really ought to exhaust all other medical options first.  She is  unfortunately ALLERGIC TO GLYCOLAX AND MIRALAX and she has been on  numerous other by mouth laxatives without obvious benefit.  They are  asking about the possibility of mineral oil.   RECOMMENDATIONS AND PLAN:  1. I have suggested they try Fleets Phospho Soda enemas and      potentially mineral oil  enemas before her other enema treatment to      see if that will sort of prime the pump.  2. I have also suggested that she take Milk of Magnesia once or twice      a day on a regular basis to try to help.  3. I am going to make a referral to the [facility].  I have explained that I am at the limits of how much I can      help her.  This is a very complicated situation and I think a      tertiary referral for this would be useful.  There may be some      element of narcotic bowel syndrome as well since she uses      methadone.     Iva Boop, MD,FACG  Electronically Signed    CEG/MedQ  DD: 06/27/2006  DT: 06/27/2006  Job #: [id number]  cc:   Jamison Neighbor, M.D.
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
## 2010-10-15 NOTE — H&P (Signed)
[place]  Patient:    [name], [name]                    MRN: [id number] Adm. Date:  [id number] Attending:  Benita Stabile                         History and Physical  CHIEF COMPLAINT:  Severe low back pain.  HISTORY OF PRESENT ILLNESS:  The patient is a 40 year old female with a five month history of severe low back pain that radiates down into her leg, but it has been much worse during the past two weeks. The initial onset of the pain happened when she was twisting, but did not have any injury or other specific cause, at that time. She has had evaluations by Dr. Sandria Manly and then Dr. Charlett Blake, then Dr. Murray Hodgkins, and more recently Dr. Vear Clock. She had trager therapy several weeks ago and the pain and the pain has been much worse since that time. She came for evaluation two days ago and received injections, at that time. However, the pain has returned and is not able to be controlled with her home treatment regimen, which includes Oxycodone primarily. She comes in for treatment and has received morphine and Dilaudid several different times with temporary relief, however, the pain continues to return. She has received a variety of other treatments including TENS unit, Neurontin, Klonopin, Vioxx, Celebrex, prednisone and none of these have given her much relief. She has had MRI of the spine in January when the original onset of the pain and it was unremarkable. She also had nerve conduction velocity and EMG several weeks ago that was not normal, but not diagnostic either. She does have some weakness that goes into the right leg and it occasionally collapses on her. No other recent change that might have aggravated these symptoms.  PAST MEDICAL HISTORY: 1. Tonsillectomy. 2. Jaw surgery. 3. Lithotripsy. 4. Cystoscopy in the past.  MEDICATIONS: 1. Celexa. 2. Oxycodone. 3. Mircette. 4. She has been on prednisone during the past week, but it  has not made much    difference.  ALLERGIES:  POSSIBLE TO DEMEROL.  SOCIAL HISTORY:  She does not smoke now. Does not drink any alcohol. She is married and works as a Warden/ranger.  REVIEW OF SYSTEMS:  She has interstitial cystitis and sees Dr. Vonita Moss. She has a past history of a seizure last year, but no problems now. She does not have any GI, pulmonary or cardiac problems now. She did have a complicated hospital stay last year at [facility].  PHYSICAL EXAMINATION:  GENERAL:  Moderate to severe distress now even with having received morphine and in multiple different doses.  HEENT:  Unremarkable.  NECK:  Supple with no pain. She has good carotid upstroke with no bruits. No JVD.  LUNGS:  Clear with good air movement. No rales or wheeze.  CARDIOVASCULAR:  Regular with no MGR.  ABDOMEN:  Soft, nontender. No masses. Bowel sounds normal.  EXTREMITIES:  Normal pulses with full range of motion of all joints and no pain with movement. There is no edema. She feels that there is fullness at her right hip area, but the area they point to is the prominence of the right greater trochanter. There is no tenderness to palpation there.  NEUROLOGICAL:  Positive SOR at 30 degrees on the right, but negative on the left. She does have decreased sensation down the  posterior aspect of the right leg, but normal sensation elsewhere. Motor exam reveals possible mild weakness of the right leg, but is most likely pain related. DTR is normal. Mental status and cranial nerves are okay. She had significant pain with ambulation. Had pain even with sitting up.  IMPRESSION: 1. Chronic low back pain with right leg involvement of uncertain etiology with    recent exacerbation. 2. History of seizure. 3. Interstitial cystitis in the past with no active symptoms. 4. Otherwise generally healthy.  PLAN:  IV morphine for pain control, probably needs neurosurgery consult. Consideration of further  diagnostic testing might include MRI versus myelogram versus other, but that will be determined in her hospital stay. DD:  10/01/00 TD:  10/02/00 Job: [id number]
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
## 2010-10-15 NOTE — Op Note (Signed)
NAMEDANAJA, LASOTA NO.:  [id number]   MEDICAL RECORD NO.:  [id number]          PATIENT TYPE:  INP   LOCATION:  1517                         FACILITY:  [facility]   PHYSICIAN:  Georgiana Spinner, M.D.    DATE OF BIRTH:  [birth year]   DATE OF PROCEDURE:  DATE OF DISCHARGE:                                 OPERATIVE REPORT   PROCEDURES:  Flexible sigmoidoscopy.   INDICATIONS:  Abnormalities seen on CT scan.   ANESTHESIA:  Phenergan 12.5 mg, Versed 8 mg, , Demerol 70 mg.   PROCEDURE:  With the patient mildly sedated in the left lateral decubitus  position, the Olympus videoscopic pediatric colonoscope was inserted in the  rectum and passed a short way at which point, the patient became combative,  grabbing out and moving around in the bed and we could not control her.  Therefore, I elected to terminate the procedure at that point and withdrawn  the colonoscope taking circumferential views of the colonic mucosa stopping  in the rectum which appeared normal on direct and retroflexed view. The  endoscope was straightened and withdrawn. The patient's vital signs, pulse  oximeter remained stable. The patient tolerated the procedure well without  apparent complication.   FINDINGS:  No abnormalities noted on this exam.   PLAN:  Will proceed to colonoscopy under general anesthesia possibly  tomorrow.       GMO/MEDQ  D:  11/08/2004  T:  11/08/2004  Job:  [id number]

## 2010-10-15 NOTE — Discharge Summary (Signed)
[name], COPUS             ACCOUNT NO.:  [id number]   MEDICAL RECORD NO.:  [id number]          PATIENT TYPE:  INP   LOCATION:  1517                         FACILITY:  [facility]   PHYSICIAN:  Sean A. Everardo All, M.D. [facility] OF BIRTH:  June 25, 1970   DATE OF ADMISSION:  11/05/2004  DATE OF DISCHARGE:  11/12/2004                                 DISCHARGE SUMMARY   REASON FOR ADMISSION:  Intractable vomiting.   HISTORY OF PRESENT ILLNESS:  The patient is a 40 year old woman admitted by  Dr. [name] on November 05, 2004, for intractable vomiting.  Please refer to her  history and physical for details.   HOSPITAL COURSE:  The patient was admitted and seen in consultation by GI.  Dr. Virginia Rochester felt that the patient's GI symptoms would improve with reduction in  the narcotics, so her MS Contin was reduced to Lortab.  He also recommended  obtaining a salicylate level if she were to be seen again with these  symptoms.  Colonoscopy was normal and GI recommended symptomatic therapy.  She was also seen in consultation by urology and they also recommended  symptomatic therapy.  She was also seen in consultation by Dr. Jeanie Sewer of  psychiatry and he recommended continuing Depakote.  He also felt strongly  that the patient should have outpatient psychotherapy in an attempt to  control her symptoms.  By the morning of November 12, 2004, the patient was  alert and oriented, eating, and showing improvement in her symptoms.  She  had reached maximal hospital benefit in the opinion of consultants, and was  thus discharged home in fair condition.   DISCHARGE DIAGNOSIS:  1.  Chronic idiopathic nausea, vomiting, and diarrhea.  2.  Chronic idiopathic bladder pain.  3.  Bipolar effective disorder.  4.  Other chronic medical problems as noted in her history and physical.  5.  Elevated hepatic transaminases noted during the hospitalization.   MEDICATIONS:  Depakote 1500 mg every night, Protonix 80 mg b.i.d., Vistaril  25 mg q.h.s., Macrobid 50  mg b.i.d., Lortab 5/500, #50, every 6 hours as  needed for pain, Zofran ODT 4 mg every 6 hours as needed for nausea.   DISCHARGE INSTRUCTIONS:  No specific restriction on activity or diet.  Follow up with me, Dr. Everardo All, in about two weeks.  She is also advised to  call [facility] for behavioral health appointment.       SAE/MEDQ  D:  11/12/2004  T:  11/12/2004  Job:  [id number]   cc:   Georgiana Spinner, M.D.  [address]  [place]  [place] [address]  Fax: [phone]   Jamison Neighbor, M.D.  [address]  [place]  [place] [address]  Fax: [phone]

## 2010-10-15 NOTE — Op Note (Signed)
[facility]  Patient:    [name], [name] Visit Number: [id number] MRN: [id number]          Service Type: NES Location: [facility] Attending Physician:  Thermon Leyland Dictated by:   Barron Alvine, M.D. Proc. Date: 09/12/01 Admit Date:  09/12/2001 Discharge Date: 09/12/2001                             Operative Report  PREOPERATIVE DIAGNOSIS:  Interstitial cystitis.  POSTOPERATIVE DIAGNOSIS:  Interstitial cystitis.  PROCEDURE:  Urethral dilation, cystoscopy, hydraulic over distention of the bladder and installation of Clorpactin and Marcaine.  SURGEON:  Barron Alvine, M.D.  ANESTHESIA:  General.  INDICATIONS FOR PROCEDURE:  [name] is a 40 year old female. She is felt to have interstitial cystitis. She previously underwent a cysto and hydrodistention of her bladder which resulted in significant improvement in her symptom complex. She has had recurrent symptoms and after extensive discussion elected to have a repeat hydraulic over distention of her bladder.  TECHNIQUE AND FINDINGS:  The patient was brought to the operating room where she had successful induction of general anesthesia. We performed a standard urethral dilation, did not find any significant snugness or stenosis. Cystoscopy revealed no abnormalities. She underwent standard hydraulic over distention to 80 cm of water pressure for 5 minutes. She again had some diffuse glomerular bleeding. A repeat hydraulic over distention was then performed. The patient had standard installation or Clorpactin, a few 100 cc at a time under gravity drainage. Her bladder was then irrigated and Marcaine was placed in her bladder. A B&O suppository was also given. The patient appeared to tolerate the procedure well. There were no obvious complications. Dictated by:   Barron Alvine, M.D. Attending Physician:  Thermon Leyland DD:  10/24/01 TD:  10/25/01 Job: [id number]

## 2010-10-15 NOTE — Assessment & Plan Note (Signed)
[facility]                           GASTROENTEROLOGY OFFICE NOTE   NAME:[name], [name] CAMERER                    MRN:          [id number]  DATE:12/09/2005                            DOB:          Jan 13, 1971    PROBLEM:  Epigastric pain and blood in stools.   HISTORY:  [name] is a 40 year old white female known to Dr. Leone Payor, who  has a history of irritable bowel syndrome, functional dyspepsia,  constipation, and history of peptic ulcer disease.  She has a history of a  motility disorder and had a gastrostomy tube required due to recurrent  nausea and vomiting.  This was removed in April, 2007.  She was diagnosed  with a gastric ulcer early in April, 2007, which required an Endoclip.  This  was located near the internal G bumper.  She has been on Nexium, is supposed  to be taking it twice a day, but had actually run out and is having a  difficult time getting prior authorized for b.i.d. dosing.  She has actually  been using her husband's medicine over the past few days.  She says that she  is having recurrent pain in her epigastrium, which she describes as burning  and stabbing, similar to her prior ulcer discomfort.  She points just to the  left of midline in her epigastrium.  She says that this has been present for  the past couple of weeks but worse over the past week.  She has had some  associated nausea and has been having occasional episodes of vomiting,  although she is trying to control them.  She is also concerned, as she has  seen small amounts of bright red blood in her stool over the past couple of  weeks.  This is intermittent and sounds primarily like she is seeing small  amounts of blood on the tissue, although she says occasionally she has seen  a little mixed in with the stool.  She has not noted any melena.  She is on  a product called Motilium for chronic constipation, which she does find  effective and is usually having a bowel  movement a couple of times per week.  She denies any aspirin or NSAIDs use.  She is taking some sort of an over-  the-counter diet pill currently, which is causing a lot of oily stools.  She  says she has never had problems with hemorrhoids in the past.   Laboratory studies done in our office today show a hemoglobin of 13.5,  hematocrit 40.  She had labs done about four days ago, which showed a  hemoglobin of 13.  White count was 7.9 today, and potassium is 2.9.  Review  of her labs from another physician's office about four days ago showed a  potassium of 3.2.   CURRENT MEDICATIONS:  Multiple, including birth control pills, TriNessa,  diazepam 5 mg daily, methadone 5 mg b.i.d., Seroquel 100 daily, hydroxyzine  75 nightly, bladder washes at bedtime, Frova as needed, Imitrex as needed,  Equetro 200 mg 2 p.o. daily, Lamictal 100 mg daily, Macrobid daily,  verapamil 120  daily, Zofran 4 mg p.r.n. Nexium 40 mg daily, Motilium.  She  is supposed to be taking 3-4 times daily, is taking 2-3 times daily,  Skelaxin 800 nightly, and Phenergan p.r.n.   ALLERGIES:  Multiple and listed in the chart previously.   PHYSICAL EXAMINATION:  VITAL SIGNS:  Weight 130.4, blood pressure 118/72,  pulse 68.  GENERAL:  Well-developed white female in no acute distress.  Accompanied by  her husband.  CARDIOVASCULAR:  Regular rate and rhythm with S1 and S2.  PULMONARY:  Clear to A&P.  ABDOMEN:  Soft.  She is minimally tender in the epigastrium.  There is no  rebound or guarding.  Bowel sounds are active.  RECTAL:  Scant stool, which is trace positive for occult blood, brown.  There is no external lesion or hemorrhoid noted.   IMPRESSION:  48.  A 40 year old white female with a history of peptic ulcer disease with      recurrent epigastric pain.  Rule out gastritis or peptic ulcer disease,      nonulcer dyspepsia.  2.  Chronic gastroesophageal reflux disease.  3.  Hematochezia, small volume.  Suspect local  anorectal source.  No      evidence for active bleed.  4.  Intestinal motility disorder.   PLAN:  1.  Resume Nexium b.i.d.  She was provided with samples today, and we will      try to get her Nexium preauthorized.  2.  Carafate slurry 1 gm t.i.d. between meals for two weeks.  3.  K-Dur 20 mEq b.i.d. x1 week.  She is asked to come back in one week for      follow-up potassium level.  4.  Return office visit with Dr. Leone Payor in 3-4 weeks or sooner p.r.n.                                   Mike Gip, PA-C                                Iva Boop, MD, Clement[facility] Graham   AE/MedQ  DD:  12/09/2005  DT:  12/09/2005  Job #:  [id number]
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
## 2010-10-15 NOTE — Procedures (Signed)
[facility]  Patient:    [name], [name]                    MRN: [id number] Proc. Date: 10/06/00 Adm. Date:  [id number] Attending:  Thyra [name] CC:         [name]. Love, M.D.  [name], M.D.   Procedure Report  [name] comes in for follow-up evaluation today.  She just got out of the hospital yesterday.  She did meet with [name] yesterday, who has some concerns about her previous premorbid psychiatric problems and how they may be affecting her current pain presentation.  She continues to complain of pain radiating from the right lumbar region, out into the right lower extremity. Work-up in the hospital included a myelogram which showed some degenerative disk disease changes which would not account for her pain.  She was seen by [name], who did not feel she had any surgically amenable problem.  [name] saw her and did not feel that there were any neurologic deficits.  He has done EMGs which have been negative.  She has had a normal MRI.  She is currently on Celebrex 200 mg twice a day, Zanaflex 2 mg at night, cyclobenzaprine 10 mg 3 times a day, Celexa 40 mg per day, promethazine p.r.n. and hydromorphone which she takes about 4 tablets a day of.  She complains that her pain is 10/10.  PHYSICAL EXAMINATION:  Blood pressure is 122/57.  Heart rate is 98, respiratory rate 16, O2 saturations 98%.  Pain level is 10/10.  She exhibits symmetric deep tendon reflexes which are 5/5.  She has intact sensation to pinprick and vibratory sense.  Straight leg raise signs are negative.  She exhibits a trigger point at the lumbosacral junction which was marked.  DESCRIPTION OF PROCEDURE:  I prepped out the area that was tender on the right lower back area and applied Betadine x 3.  The skin was anesthetized with a 27 gauge needle using 1% lidocaine.  A 25 gauge needle was introduced down to approximately at the trigger point and elicited  concordant pain in this distribution.  I injected 3 cc of 1% lidocaine with 20 mg of Medrol.  The patient tolerated this well.  Postprocedure condition - stable.  DISCHARGE INSTRUCTIONS: 1. Resume previous diet. 2. Limitations and activities per instruction sheet. 3. Stop Dilaudid. 4. Continue other medications. 5. Duragesic patch 25 mcg 1 every 3 days, #10. 6. Follow up with me in four weeks. 7. The patient was advised that she needs to follow up with [name]    for her psychologic counseling and evaluation as well as pain coping skills    and with [name].  In the interim, I went ahead and arranged    for her to be seen by [name] at [facility], but he cannot see her until at    least two months from now. DD:  10/06/00 TD:  10/07/00 Job: [id number]
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
## 2010-10-15 NOTE — Procedures (Signed)
[place]  Patient:    [name], [name]                    MRN: [id number] Proc. Date: 08/14/00 Adm. Date:  [id number] Attending:  Thyra [name] CC:         [name], M.D.   Procedure Report  PROCEDURE PERFORMED:  Piriformis trigger point injection.  DIAGNOSIS:  Low back pain with piriformis syndrome and element of facet joint syndrome.  INTERVAL HISTORY:  The patient noted two days after her last visit when we did the combination trigger point injection plus right facet joint injection, she had a good 24-36 hours of nearly total resolution in her pain.  She has tolerated the MS Contin much better than the short acting opiates, and in general feels better from his prospective.  She is asking about getting back to work and actually wants to go on a trip to [place] and one down to [place] next week.  At this point, I advised her that perhaps this was a little bit aggressive and would recommend that she start out with just getting back to work.  She is scheduled to go back to work tomorrow.  She complains of lower back discomfort, which is more in the hip region along the iliac crest posteriorly and down into the right buttock.  To a lesser extent, it extends back over to the facet joint regions.  She has been doing some stretching exercises.  She notes that overall with the morphine, which she is tolerating much better, she has much less discomfort.  PHYSICAL EXAMINATION:  Blood pressure 145/50, heart rate 100, respiratory rate is 20, and O2 saturation 96%.  Straight leg raise signs are negative. Internal and external rotation of her hips does not increase her discomfort. She is tender over the right piriformis muscular region as well as in the lower lumbar facet regions predominately on the right.  Motor is grossly intact.  She has symmetric deep tendon reflexes.  DESCRIPTION OF PROCEDURE:  After informed consent was obtained, the  patient was placed in the left lateral decubitus position.  Her left leg was extended and the right flexed at the hip and the knee.  I identified the greater trochanter and the posterosuperior iliac spine on the right side and drove a line between the two.  At the mid point of this line, the perpendicular was drawn down to resect the second line from the greater trochanter to the sacral hiatus.  With these lines intersected, pressure was applied which elicited discomfort.  The area was marked and prepped with Betadine x 3.  I anesthetized the skin with a 27-gauge needle using 1% lidocaine.  A 25-gauge spinal needle was introduced down to approximate the right piriformis muscle. I injected 3 cc of local anesthetic, which consisted of 3 cc of 1% lidocaine with 3 cc of 0.5% bupivacaine.  The needle was removed intact.  At 15 minutes later, the patient noted marked attenuation in her buttock discomfort, but noted ongoing lower lumbar facet region discomfort.  PLAN: 1. Resume previous diet. 2. Limitation activities per instruction sheet as outlined by my    assistant today. 3. Continue with stretching exercises. 4. She will need to consider getting [name] book on the    low back and start these exercises in addition to the piriformis    exercises I shared with her in the past. 5. Continue on the MS Contin 30 mg one p.o.  q.8h.  I gave her    another prescription for 40 tablets. 6. Follow up with me in three weeks.  She is to call later this    week to let us know how she does back at work before she makes    an effort to go down to [place]. DD:  08/14/00 TD:  08/15/00 Job: [id number]

## 2010-10-15 NOTE — Op Note (Signed)
NAMEREENA, BORROMEO                         ACCOUNT NO.:  [id number]   MEDICAL RECORD NO.:  [id number]                   PATIENT TYPE:  INP   LOCATION:  0342                                 FACILITY:  [facility]   PHYSICIAN:  Georgiana Spinner, M.D.                 DATE OF BIRTH:  [birth year]   DATE OF PROCEDURE:  DATE OF DISCHARGE:                                 OPERATIVE REPORT   PROCEDURE:  Upper endoscopy with biopsy.   INDICATIONS FOR PROCEDURE:  Vomiting.   DESCRIPTION OF PROCEDURE:  With the patient mildly sedated in the left  lateral decubitus position, the Olympus videoscopic endoscope was inserted  in the mouth and passed under direct vision through the esophagus which  appeared normal into the stomach. The fundus, body, antrum, duodenal bulb  and second portion of the duodenum were visualized. From this point, the  endoscope was slowly withdrawn taking circumferential views of the duodenal  mucosa until the endoscope was then pulled back in the stomach, placed in  retroflexion to view the stomach from below. The endoscope was then  straightened and withdrawn taking circumferential views of the remaining  gastric and esophageal mucosa stopping in the antrum where linear erythema  was photographed and biopsied. We next pulled back to the fundus of the  stomach where a long irregular ulceration of the gastric fundus was noted  with surrounding erythema, it was flat not heaped up and this was  photographed and multiple biopsies of the ulcer in its entire length were  taken. Subsequently then the endoscope was withdrawn.  The patient's vital  signs and pulse oximeter remained stable. The patient tolerated the  procedure well without apparent complications.   FINDINGS:  Linear erythema of gastric antrum, ulceration of fundus of  stomach etiology of which is not clear and certainly gastroparesis is  present but the lumen is widely thickened.   IMPRESSION:  1. Vomiting probably on  the basis of gastroparesis without evidence of     obstruction.  2. Ulceration and erythema as noted above.   PLAN:  Will await biopsy report.  The patient is already on PPI and will  treat according to results.                                               Georgiana Spinner, M.D.    GMO/MEDQ  D:  08/05/2003  T:  08/05/2003  Job:  [id number]   cc:   Jamison Neighbor, M.D.  [address]  [place]  [place] [address]  Fax: [phone]

## 2010-10-15 NOTE — Op Note (Signed)
NAMEMAEVE, DEBORD                       ACCOUNT NO.:  [id number]   MEDICAL RECORD NO.:  [id number]                   PATIENT TYPE:  AMB   LOCATION:  NESC                                 FACILITY:  [facility]   PHYSICIAN:  Jamison Neighbor, M.D.               DATE OF BIRTH:  1971/01/26   DATE OF PROCEDURE:  12/30/2003  DATE OF DISCHARGE:                                 OPERATIVE REPORT   PREOPERATIVE DIAGNOSIS:  Interstitial cystitis.   POSTOPERATIVE DIAGNOSIS:  Interstitial cystitis.   PROCEDURE:  Cystoscopy, urethral calibration, hydrodistention of the  bladder, Marcaine and Pyridium instillation, Marcaine and Kenalog injection.   SURGEON:  Jamison Neighbor, M.D.   ANESTHESIA:  General.   COMPLICATIONS:  None.   DRAINS:  None.   BRIEF HISTORY:  This 40 year old female known to have interstitial cystitis.  She has been treated with both instillation therapy and oral therapy but has  had a big flareup of her symptoms.  The patient does have an interstem in  place, which was working very nicely, in terms of controlling her frequency  but has done very little for her pain.  The patient has had an increase in  her urgency and has requested that we repeat her hydrodistention, which has  always helped her in the past.  She is aware of the theoretical possibility  that this might lead to axonal sprouting, which can increase her pain long-  term, but she is desperate enough that she would like to try this with no  guarantee that this is going to improve the situation.  She understands the  risks and benefits of the procedure and made full informed consent.   PROCEDURE:  After the successful  induction of general anesthesia, the  patient is placed in the lateral decubitus position and prepped with  Betadine and draped in the usual sterile fashion.  Careful bimanual  examination revealed no abnormalities of the urethra.  She has no cystocele,  rectocele, or enterocele.  There are no  masses on bimanual exam.  There is  no bulk, prolapse, urethritis, or vaginitis.  The urethra was calibrated to  a 62 [place] with __________ urethral sounds but no evidence of stenosis or  stricture.  The cystoscope was inserted.  The bladder was carefully  inspected.  It was free of any tumors or stones.  Both ureteral orifices  were normal in configuration and location.  The bladder was then distended  at a pressure of 100 cm of water for five minutes.  When the bladder was  drained, glomerulations could be seen throughout the bladder.  There were  linear mucosal tears, primarily on the right-hand side, but nothing went all  the way down into the fat.  The patient had diminished bladder capacity of  575, which is exactly the average for an interstitial cystitis patient and  is only one-half of a normal capacity bladder.  The  patient did not require repeat biopsy.  The bladder was drained.  A mixture  of Marcaine and Pyridium was left in the bladder, and Kenalog was injected  periurethrally.  The patient tolerated the procedure well and was taken to  the recovery room in good condition.                                               Jamison Neighbor, M.D.    RJE/MEDQ  D:  12/30/2003  T:  12/30/2003  Job:  [id number]

## 2010-10-15 NOTE — Procedures (Signed)
[facility]  Patient:    [name], [name]                    MRN: [id number] Proc. Date: 10/30/00 Adm. Date:  [id number] Attending:  Thyra Breed CC:         Chales Salmon. Abigail Miyamoto, M.D.  Genene Churn. Love, M.D.   Procedure Report  PROCEDURE:  Epidural blood patch.  DIAGNOSIS:  Postural puncture headache.  HISTORY:  The patient was hospitalized recently with a fever and eruption of her skin and diagnosed as having a probable drug-induced fever.  It was felt either to be on the basis of her Zanaflex or Celebrex by the infectious disease expert.  Dr. Jamie Brookes felt like it might be coming from the fentanyl, so she was taken off her fentanyl patch.  In talking with her husband and with the patient, it sounds like it was very unlikely that it was coming from the fentanyl patch.  She presents today with a postural headache after an LP was done to work up her presentation.  Whenever she sits up, she gets vomiting, and this has occurred over the weekend.  PHYSICAL EXAMINATION:  Blood pressure 112/58, heart rate 99, respiratory rate 18, O2 saturations 99%.  Pain level is 10/10.  The patient is in obvious discomfort with a great deal of discomfort when she sits up.  DESCRIPTION OF PROCEDURE:  After informed consent was obtained, the patient was placed in a sitting position and monitored.  Her back was prepped with Betadine x 3.  Using a 25 gauge needle, I anesthetized the interspace L4-5 with 1% lidocaine.  An 18 gauge Hustead needle was introduced to the lumbar epidural space to loss of resistance to preservative-free normal saline.  The depth was 4 cm.  There was no blood or CSF.  The right antecubital fossa was prepped with Betadine x 3 and under sterile conditions, 12 mL of blood was obtained.  This was injected into the epidural space via the epidural needle. The needle was flushed with 1 cc of preservative-free normal saline and removed intact.  The  patient was placed in the supine position and observed.  POSTPROCEDURE CONDITION:  Stable.  DISCHARGE INSTRUCTIONS: 1. Resume previous diet.  She is to drink at least three 2 liter bottles of a    caffeinated beverage per day for the next 3-4 days. 2. Limitations on activities per instruction sheet. 3. Continue on current medications. 4. I plan to see back in follow-up on Thursday to reassess her lower back    discomfort. DD:  10/30/00 TD:  10/30/00 Job: [id number]

## 2010-10-15 NOTE — Op Note (Signed)
[name], [name]             ACCOUNT NO.:  [id number]   MEDICAL RECORD NO.:  [id number]          PATIENT TYPE:  AMB   LOCATION:  NESC                         FACILITY:  [facility]   PHYSICIAN:  Jamison Neighbor, M.D.  DATE OF BIRTH:  05-24-1971   DATE OF PROCEDURE:  05/17/2005  DATE OF DISCHARGE:                                 OPERATIVE REPORT   PREOPERATIVE DIAGNOSIS:  Interstitial cystitis.   POSTOPERATIVE DIAGNOSIS:  Interstitial cystitis.   PROCEDURE:  Cystoscopy, urethral calibration, hydrodistention of the  bladder, Marcaine and Pyridium instillation, Marcaine and Kenalog injection.   SURGEON:  Jamison Neighbor, M.D.   ANESTHESIA:  General.   COMPLICATIONS:  None.   DRAINS:  None.   BRIEF HISTORY:  This 40 year old female with a very complex urologic  history.  She is felt to have interstitial cystitis and has been treated  with a combination of oral medications, installation therapy,  hydrodistention, and also has an InterStim in place.  The patient has a  significant complication in the fact that she has problems with irritable  bowel syndrome and significant GI distress.  Most recently, her biggest  problem has been severe nausea.  It has been felt by the gastroenterologist  that the nausea is caused by her pain medication, as she requires pain  medication for treatment of her pseudoseizures.  The patient finds that when  she does not have access to pain medication, the seizures get worse.  When  she can be adequately treated, the pseudoseizures do seem to diminish.   The patient has had significant weight loss to the point where there has  been some consideration given to placement of a PEG tube in order to feed  her without nausea.  The patient is scheduled to have that done sometime in  January.  She was in my office recently and feels that her interstitial  cystitis is not under good control, despite the use of medications and her  InterStim.  She has requested  that a repeat hydrodistention be performed.  She understands the risks and benefits of the procedure, having had this  done in the past.  She knows that there is no guarantee that she will have  any decrease in her symptoms and there is certainly no guarantee that it  will be long term.  She realizes that her pain will get worse temporarily,  and this may exacerbate her pseudoseizures and that the medication she takes  postoperatively may bother her stomach.  She gave full informed consent of  the procedure.   PROCEDURE:  After the successful induction of general anesthesia, the  patient is placed in a dorsal lithotomy position, prepped with Betadine, and  draped in the usual sterile fashion.  Careful bimanual examination revealed  no abnormality of the urethra or bladder base.  The patient's uterus was  palpably normal.  There were no adnexal masses or any kind.  The urethra was  calibrated to a 19 [place] female urethral sounds.  There was no stenosis or  stricture.  The cystoscope was inserted.  The bladder was carefully  inspected.  It was free of any tumor or stones.  Both ureteral orifices were  normal in configuration and location.  Hydrodistention of the bladder was  then performed.  The bladder was distended at a pressure of 100 cm of water  for five minutes.  When the bladder was drained, some small glomerulations  could be seen throughout the bladder.  The patient had a bladder capacity of  600  cc, which is very close to the average for interstitial cystitis of 575.  The bladder was drained.  A mixture of Marcaine and Pyridium was left in the  bladder.  Marcaine and Kenalog were injected periurethrally.  The patient  tolerated the procedure well and was taken to the recovery room in good  condition.           ______________________________  Jamison Neighbor, M.D.  Electronically Signed     RJE/MEDQ  D:  05/17/2005  T:  05/18/2005  Job:  [id number]   cc:   Georgiana Spinner,  M.D.  Fax: [phone]

## 2010-10-15 NOTE — Assessment & Plan Note (Signed)
[facility]                                 ON-CALL NOTE   NAME:BULLINArtie, Takayama                      MRN:          [id number]  DATE:09/12/2006                            DOB:          May 16, 1971    TIME OF CALL:  6:25 p.m.   TELEPHONE NUMBER:  [id number].   PHYSICIAN:  Dr. Stan Head.   Ms. Spitler complains tonight of worsening problems with mid abdominal  pain. She has been evaluated by Dr. Leone Payor and apparently has also been  referred to [facility] for additional evaluation. She is maintained  on Nexium twice daily, methadone and other medications. She has noted a  worsening of her chronic abdominal pain over the past 2-3 hours. She has  no nausea, vomiting, diarrhea, fevers, chills, change in bowel habits,  melena, hematochezia or hematemesis. I have advised her the best  approach would be to go to the emergency room for further evaluation.  She states that if her symptoms worsen she will but she has had this  problem for quite some time with on and off exacerbations of her pain  and she feels this is likely just another exacerbation of her pain and  an answer will not be found in the emergency room. I advised her to  remain on clear liquids and take her medications as prescribed and begin  the use of Tums tonight. She will call the office tomorrow to schedule a  followup appointment with Dr. Leone Payor.     Venita Lick. Russella Dar, MD, [facility]  Electronically Signed    MTS/MedQ  DD: 09/12/2006  DT: 09/13/2006  Job #: [id number]   cc:   Iva Boop, MD,FACG

## 2010-10-15 NOTE — Op Note (Signed)
NAMETYNESHA, FREE NO.:  [id number]   MEDICAL RECORD NO.:  [id number]          PATIENT TYPE:  INP   LOCATION:  1517                         FACILITY:  [facility]   PHYSICIAN:  Georgiana Spinner, M.D.    DATE OF BIRTH:  09-Mar-1971   DATE OF PROCEDURE:  DATE OF DISCHARGE:                                 OPERATIVE REPORT   PROCEDURE:  Colonoscopy.   ENDOSCOPIST:  Georgiana Spinner, M.D.   INDICATIONS:  Thickened colon seen on CT scan, this done to rule out  colitis.   ANESTHESIA:  General due to failed colonoscopy with conscious sedation.   PROCEDURE:  With the patient mildly sedated in the left lateral decubitus  position, the Olympus videoscopic colonoscope was inserted in the rectum and  passed under direct vision to the cecum identified by ileocecal valve and  appendiceal orifice, both which were photographed.  We entered into the  terminal ileum, which appeared normal and was photographed as well.  From  this point, the colonoscope was slowly withdrawn, taking circumferential  views of the colonic mucosa as we withdrew the colonoscope all the way to  the rectum, taking random biopsies of what appeared to be normal-appearing  mucosa along the way.  Once we reached the rectum, the endoscope was placed  in a retroflexed view to view the anal canal from above, which appeared  normal.  The endoscope was straightened and withdrawn.  The patient's vital  signs and pulse oximetry remained stable.  The patient tolerated procedure  well without apparent complication.   FINDINGS:  Unremarkable examination including the terminal ileum, await  biopsy reports.  The patient will call me for results if discharged prior to  the reports returning and will follow up with me as an outpatient       GMO/MEDQ  D:  11/10/2004  T:  11/10/2004  Job:  [id number]

## 2010-10-15 NOTE — H&P (Signed)
[facility]  Patient:    [name], [name]                    MRN: [id number] Adm. Date:  [id number] Attending:  Thyra Breed CC:         Chales Salmon. Abigail Miyamoto, M.D.  Genene Churn. Love, M.D.  Rosanne Sack, M.D.   History and Physical  Dilia comes in for followup evaluation today.  Since her last evaluation the patient has markedly improved with regard to her spinal headaches.  Her fevers continued up until yesterday and they have stopped now.  It does not appear that the Duragesic was the cause of her fevers at all.  I suspect it was likely coming from Celebrex or one of her other medications.  She continues on the Celexa, Mircette, and Phenergan regularly.  She has only taken two hydrocodone in the past 48 hours and did not notice that they really did anything for her discomfort.  She continues to have some lower back discomfort radiating out to the right buttock region.  She has a tens unit but she is not using it.  She feels pretty markedly improved overall and just described an achy type feeling in her lower back with no sharp pains.  She denied any numbness and tingling, bowel or bladder incontinence, or weakness.  PHYSICAL EXAMINATION  VITAL SIGNS:  Blood pressure 141/77, heart rate 105, respiratory rate 20, O2 saturation 95%, pain is minimal, temperature 98.5.  BACK:  She shows good healing from a previous injection site over her lower back.  EXTREMITIES:  Straight leg raise signs are negative.  Deep tendon reflexes are symmetric in the lower extremity.  IMPRESSION: 1. Low back pain syndrome markedly improved with known underlying degenerative    changes in her lower back. 2. Status post postural puncture headache improved after blood patch and    hydration. 3. Depression per Dr. Nolen Mu. 4. Other medical problems per primary care physician, Dr. Henrine Screws.  DISPOSITION: 1. Remain off hydrocodone and opiates. 2. Continue  Celexa and Mircette. 3. Lidoderm patch over the lower back to be worn for 12 hours q.d., preferably    at night since she aches more in the mornings and less during the day.  She    was also encouraged to gradually increase her level of activity to try and    recondition herself. 4. Follow-up with me in six weeks.  She was also encouraged to go ahead and    restart using her tens unit. DD:  11/03/00 TD:  11/03/00 Job: [id number]
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
## 2010-10-15 NOTE — Discharge Summary (Signed)
NAMESISSY, GOETZKE                         ACCOUNT NO.:  [id number]   MEDICAL RECORD NO.:  [id number]                   PATIENT TYPE:  INP   LOCATION:  0342                                 FACILITY:  [facility]   PHYSICIAN:  Jamison Neighbor, M.D.               DATE OF BIRTH:  [birth year]   DATE OF ADMISSION:  07/31/2003  DATE OF DISCHARGE:  08/07/2003                                 DISCHARGE SUMMARY   DISCHARGE DIAGNOSES:  1. Interstitial cystitis.  2. Irritable bowel syndrome.  3. Chronic abdominal pain.  4. Intractable nausea.  5. Chronic migraines.  6. Seizures.  7. History of kidney stones.  8. Peptic ulcer disease.   HISTORY:  This 40 year old patient is a longstanding patient of Dr. Larey Dresser.  The patient was referred to me for further evaluation because it  was felt that there was nothing further he could do for her interstitial  cystitis and her chronic pelvic pain.  Dr. Vonita Moss asked if I could  evaluate her for an InterStim implant which I scheduled for August 15, 2003.   The patient contacted the office the week prior to admission and came to see  Dr. Vonita Moss by ambulance due to severe increase in her pain.  The patient  was given narcotics and we tried to get her pain under control and I was  asked to try to move the InterStim up.  I brought the patient in for  additional evaluation and told her the InterStim could not be moved up but I  really did not want to do that for pain purposes but simply for her  frequency and urgency incontinence.  I was concerned that the patient's  situation had worsened and I felt that her pain was likely to be due to a GI  source.   The patient's past medical history is quite complicated.  She is known to  have irritable bowel syndrome, interstitial cystitis, past history of kidney  stones.  She is also known to have migraines and seizures.  She also suffers  from major depression.  At the time of admission her medications  included  oral contraceptives, Zelnorm, Elmiron, Prelieve, Seroquel, Depakote,  hydroxyzine, Valium, Tylox, and Phenergan.  I did note that the patient had  had some weight gain which we felt was due to the Seroquel.  We also knew  that a number of medications could lead to some problems with gastric  obstruction secondary to a gastroparesis.   The patient no longer smokes, she does not use alcohol.  Review of systems  was positive for chronic pain, chronic headaches, some weakness on the right-  hand side that is unexplained, chronic nausea, urgency incontinence, as well  as intermittent pain that has been felt in the past to be due to kidney  stones but clearly does not appear to be stones at this time.   Her initial physical examination  was remarkable for her abdominal pain and  the associated nausea.  We felt that she needed to have GI evaluation.  We  arranged for her to undergo CT scan, GI evaluation, and made the decision to  hold off on her InterStim until we had her abdomen straightened out.   The patient was admitted to the hospital and arrangements were made to be  seen by Dr. Sabino Gasser.  He felt that her problem was primarily a  gastroparesis due to her increased use of narcotics.  He placed her on  Zelnorm before meals and also arranged for her to be placed on proton pump  inhibitor.  He also planned for her to have gastric emptying study and an  endoscopy.   During the hospital stay we placed the patient on bladder installations to  try and decrease her bladder pain and cut down on her need for narcotics;  this was clearly helpful.  The patient felt that the pain in her upper  abdomen was worse but that she clearly noted that there was a definite  nausea associated with this separate, distinct GI pain.  She felt that this  was the source for her emesis and this continued despite the use of Reglan,  Zofran, and Compazine.  We arranged for her to have a gastric emptying  study  and this did show poor emptying.  She also underwent an upper GI endoscopy  by Dr. Virginia Rochester which clearly showed evidence of a gastritis which was biopsied.  The patient did seem to improve after her bowels had been cleared.  We gave  her a soap suds enema as well as MiraLax.  We did arrange for her to undergo  the cystoscopy and hydrodistention which was helpful in terms of controlling  her pain.  By the time of discharge the patient did feel better.  She seemed  to think that her GI symptoms were improved.  The biopsy was not ready by  the time of discharge.  She was sent home on Levaquin 500 mg daily following  her hydrodistention.  She had MiraLax that she was using for her GI  problems.  She also was sent home on Protonix.  She was given Pyridium Plus  for postoperative management of her burning and spasm as well as some pain  medication.  She used Phenergan and Valium as needed and will continue on  the Atarax that she uses for her interstitial cystitis.  We will continue  her on her bladder installations as well.   The patient will return to my office in follow-up and if she is doing better  we can arrange for her to have her InterStim as scheduled.                                               Jamison Neighbor, M.D.    RJE/MEDQ  D:  08/26/2003  T:  08/26/2003  Job:  [id number]   cc:   Georgiana Spinner, M.D.  [address]  [place]  [place] [address]  Fax: [phone]
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
## 2010-10-15 NOTE — Op Note (Signed)
[name], [name]                         ACCOUNT NO.:  [id number]   MEDICAL RECORD NO.:  [id number]                   PATIENT TYPE:  OBV   LOCATION:  0347                                 FACILITY:  [facility]   PHYSICIAN:  Maretta Bees. Vonita Moss, M.D.             DATE OF BIRTH:  25-Feb-1971   DATE OF PROCEDURE:  03/05/2002  DATE OF DISCHARGE:                                 OPERATIVE REPORT   PREOPERATIVE DIAGNOSIS:  Left ureteral calculus.   POSTOPERATIVE DIAGNOSIS:  Left ureteral calculus.   PROCEDURE:  Cystoscopy, left retrograde pyelogram with interpretation, left  ureteroscopy and insertion of double J catheter.   SURGEON:  Maretta Bees. Vonita Moss, M.D.   ANESTHESIA:  General.   INDICATIONS FOR PROCEDURE:  This 40 year old white female with a past  history of urinary tract calculus disease and interstitial cystitis  presented to the office today with severe left flank pain, nausea and  vomiting due to a 3 x 5 mm stone at the level of L3 in the left ureter. She  had some hydronephrosis behind it. She also has bilateral renal calculi. She  was counselled about observation versus intervention and endoscopic approach  done today with ureteroscopy and possible stone removal. She was advised  about need for a double J catheter and the possibility of stone being pushed  back in the kidney since it was in the upper part of the ureter.   DESCRIPTION OF PROCEDURE:  The patient was brought to the operating room,  placed in lithotomy position, external genitalia prepped and draped in the  usual fashion. She was cystoscoped and the bladder was unremarkable. A metal  guidewire was placed at the left ureter with the tip softened. There was  just a slight hang up just at the sacroiliac junction. Under fluoroscopic  observation, the guidewire went up and the stone in the ureter was seen to  move retrograde into the kidney. I removed the cystoscope and over the  guidewire inserted the ureteral  dilating sheath and it went up easily until  the same level that the guidewire seemed to hang up at, at which point the  ureter straightened out and went up hill toward the kidney. I then inserted  the 6.5 French flexible ureteroscope and after exiting the sheath and into  this narrow area at which point the scope would not go beyond this point.  There was a kink in the first guidewire so I reinserted a second guidewire  which seemed to go easily up toward the kidney. I then redilated with the  ureteral dilating sheath and inserted the ureteroscope and observed what I  thought was fat. Therefore, I backed up the scope and there was a  perforation of the ureteral wall where this narrow area was located. Then  utilizing a rigid scope, I got up to that area and was able to see the true  ureteral lumen and pass up  a Glidewire without difficulty. I then inserted  an open ended catheter over the Glidewire and injected contrast and  demonstrated that I was in the pyelocaliceal system in good position and  then I backloaded the open ended catheter through the cystoscope and put up  a metal guidewire into the pyelocaliceal system and inserted a 6 French 26  cm double J catheter with a full coil in the renal pelvis and a full coil in  the bladder with the string removed. I felt that was the best option and not  to pursue any further ureteroscopy. The bladder was emptied, scope removed,  a B&O suppository inserted per rectum, the patient sent to the recovery room  in good condition.                                              Maretta Bees. Vonita Moss, M.D.   LJP/MEDQ  D:  03/05/2002  T:  03/06/2002  Job:  [id number]

## 2010-10-15 NOTE — Discharge Summary (Signed)
[facility]  Patient:    TREA, CARNEGIE                    MRN: [id number] Adm. Date:  [id number] Disc. Date: [id number] Attending:  Evlyn Clines CC:         Charlaine Dalton. Sherene Sires, M.D. [facility]             John J. Annabell Howells, M.D.                           Discharge Summary  HISTORY:  Briefly, Ms. Khurana is a 40 year old white female, with a history of stones who presented with severe right flank pain, nausea, and vomiting.  She had a 3 mm right proximal ureteral stone.  The pain was poorly controlled and it was felt that cysto ___________ possible ureteroscopy were indicated.  For additional details of the history and physical, please see the dictated H&P.  ACCESSORY CLINICAL INFORMATION:  White count 13, hemoglobin 11.4.  Chemistries within normal limits with exception of chloride slightly high at 114, glucose of 122, calcium of 8, albumin 3.3.  Pregnancy test negative. Urine was clear.  HOSPITAL COURSE:  On the day of admission, the patient was taken to the operating room where she underwent cystoscopy, ureteroscopy, and placement of a right ureteral stent.  After the procedure she was found to have an inflammatory stricture of the proximal ureter where the stone had been sitting, and I was unable to negotiate the scope by that.  In light of that, the stone was bounced back to the kidney and a stent was placed. Postoperatively she developed reduced oxygen saturations requiring supplemental O2 and a chest x-ray revealed pulmonary edema.  Dr. Francella Solian was called in consultation.  It was advised that she be placed in the intensive care unit for further evaluation.  Over the ensuing several days her pulmonary function gradually improved.  She did have a fair amount of bladder irritation with her stent requiring Pyridium and Urised, a Foley left in as well for fluid management.  On April 25 she was improved.  Her O2 saturation was 96% on 1 L.  KUB was  obtained which revealed faint stent of the UPJ.  She had been on some Levaquin perioperatively.  That was changed to p.o. on April 25.  She was initially scheduled for lithotripsy on April 26.  However, she continued to have shortness of breath and required supplemental O2 and the lithotripsy was cancelled.  Dr. Sherene Sires had kept her on some breathing treatments with albuterol during this follow-up period.  On April 27 she continued to improve but was complaining of bladder discomfort.  The eventual cause of her breathing difficulty and pulmonary edema was felt to possibly be an aspiration pneumonitis.  She was gradually weaned off her oxygen.  On April 28 her Foley catheter was removed.  She was continued on her pain management.  On April 29 she had continued to improve and was felt to be ready for discharge.  Dr. Sherene Sires did suggest that we avoid nitrofurantoin in this patient.  DISCHARGE DIAGNOSIS:  Right proximal ureteral stone, with severe pain. Complications during the admission include postoperative pulmonary edema with probable aspiration pneumonitis.  DISCHARGE MEDICATIONS:  1. Vicodin 1-2 p.o. q.4-6h. p.r.n. pain.  2. Bactrim DS 1 p.o. q.d.  3. Phenergan 25 mg p.o. p.r.n.  4. Pyridium Plus 1 p.o. q.i.d. p.r.n.  DISPOSITION:  To home.  She will have lithotripsy on May 3.  Her condition is improved.  Her prognosis good. DD:  10/01/99 TD:  10/04/99 Job: [id number]

## 2010-10-15 NOTE — Op Note (Signed)
NAMELYRIC, ROSSANO             ACCOUNT NO.:  [id number]   MEDICAL RECORD NO.:  [id number]          PATIENT TYPE:  AMB   LOCATION:  NESC                         FAC[facility]  [facility]   PHYSICIAN:  Jamison Neighbor, M.D.  DATE OF BIRTH:  [birth year]   DATE OF PROCEDURE:  01/25/2005  DATE OF DISCHARGE:                                 OPERATIVE REPORT   SERVICE:  Urology.   PREOPERATIVE DIAGNOSES:  Interstitial cystitis.   POSTOPERATIVE DIAGNOSES:  Interstitial cystitis.   PROCEDURE:  Cystoscopy, urethral calibration, hydrodistention of the  bladder, Marcaine and Pyridium installation, Marcaine and Kenalog injection.   SURGEON:  Jamison Neighbor, M.D.   ANESTHESIA:  General.   COMPLICATIONS:  None.   DRAINS:  None.   BRIEF HISTORY:  This 40 year old female is known to have interstitial  cystitis. The patient has requested a repeat hydrodistention be performed.  The patient understands the risks and benefits of a hydrodistention, she  gave full and informed consent.   DESCRIPTION OF PROCEDURE:  After successful induction of general anesthesia,  the patient was placed in the dorsal lithotomy position, prepped with  Betadine and draped in the usual sterile fashion. The patient's bimanual  examination revealed no abnormalities of the urethra, there is no cystocele,  rectocele or enterocele. There are no masses of any kind. The patient had no  urethral discharge or vaginal discharge. The urethra was calibrated at 14  [place] with female urethral sounds with no evidence of stenosis or  stricture. The cystoscope was inserted, the bladder was carefully inspected  and was free of any tumors or stones. Both ureteral orifices were normal in  configuration and location. Hydrodistention of the bladder was then  performed at a pressure of 100 cmH2O for 5 minutes. When the bladder was  drained, the patient had a 500 mL bladder capacity which is comparable to  the normal capacity for an IC patient  which is 575 but quite a bit less than  the average for normal patients off 1150. The patient did not require a  biopsy. A mixture of Marcaine and Pyridium was left within the bladder,  Marcaine and Kenalog were injected periurethrally. The patient received a  B&O suppository. She received intraoperative zofran  and Toradol. She will be sent home with a prescription for Lorcet 10 as well  as some zofran. She already has Macrodantin at home and Pyridium plus for  any postoperative dysuria. She will return to the office in followup for  additional planning.           ______________________________  Jamison Neighbor, M.D.  Electronically Signed     RJE/MEDQ  D:  01/25/2005  T:  01/25/2005  Job:  [id number]

## 2010-10-15 NOTE — Op Note (Signed)
NAMEDENEISHA, DADE             ACCOUNT NO.:  [id number]   MEDICAL RECORD NO.:  [id number]          PATIENT TYPE:  AMB   LOCATION:  ENDO                         FACILITY:  [facility]   PHYSICIAN:  Georgiana Spinner, M.D.    DATE OF BIRTH:  [birth year]   DATE OF PROCEDURE:  11/04/2004  DATE OF DISCHARGE:                                 OPERATIVE REPORT   PROCEDURE PERFORMED:  Upper endoscopy.   INDICATIONS FOR PROCEDURE:  Abdominal pain.   ANESTHESIA:  Demerol 100 mg, Versed 10 mg, Phenergan 25 mg.   DESCRIPTION OF PROCEDURE:  With the patient mildly sedated in the left  lateral decubitus position, the Olympus video endoscope was inserted in the  mouth which appeared normal except for one area of white mucosa.  I could  not tell if that was exudate or whether just whitish colored mucosa.  Therefore, I photographed and biopsied this.  We entered into the stomach.  Fundus, body and antrum were visualized.  Antrum appeared to be mildly  erythematous and biopsies were taken of these areas.  We advanced to the  duodenal bulb and second portion of the duodenum, both appear normal.  From  this point the endoscope was slowly withdrawn, taking circumferential views  of the duodenal mucosa until the endoscope had been pulled back in the  stomach, placed on retroflexion to view the stomach from below.  The  endoscope was then straightened and withdrawn, taking circumferential views  of the remaining gastric and esophageal mucosa.  Patient's vital signs and  pulse oximeter remained stable.  The patient tolerated the procedure well  without apparent complications.   FINDINGS:  As above, a whitish discoloration of the esophagus either exudate  or mucosal change and erythema of the antrum above these biopsied, also  incomplete wrap of the gastroesophageal junction around the endoscope  indicating laxity of the gastroesophageal sphincter.  Otherwise an  unremarkable exam.   PLAN:  Await biopsy report.   The patient will call me for results and follow  up with me as an outpatient.       GMO/MEDQ  D:  11/04/2004  T:  11/04/2004  Job:  [id number]   cc:   Jamison Neighbor, M.D.  [address]  [place]  [place] [address]  Fax: [phone]

## 2010-10-15 NOTE — Op Note (Signed)
[name], [name] NO.:  [id number]   MEDICAL RECORD NO.:  [id number]          PATIENT TYPE:  INP   LOCATION:  0376                         [facility]ACILITY:  [facility]   PHYSICIAN:  Georgiana Spinner, M.D.    DATE OF BIRTH:  1970-10-08   DATE OF PROCEDURE:  DATE OF DISCHARGE:                                 OPERATIVE REPORT   PROCEDURE:  Upper endoscopy.   INDICATIONS:  Abdominal pain, see consult note for details.   ANESTHESIA:  Demerol 100 mg, Versed 10 mg.   PROCEDURE:  With the patient mildly sedated in the left lateral decubitus  position, the Olympus videoscopic endoscope was inserted in the mouth and  passed under direct vision through the esophagus, which appeared normal, and  into the stomach, fundus, body, appeared normal; however, in the antrum  there was linear erythema with ulcerations extending from the pyloric area.  This was photographed and biopsied.  The ulcers were shallow and white-  based, and we biopsied this rather extensively.  We entered into the  duodenal bulb and the second portion of the duodenum, both appeared normal.  From this point, the endoscope was slowly withdrawn, taking circumferential  views of duodenal mucosa until the endoscope had been pulled back into the  stomach and placed in retroflexion, viewing the stomach from below.  The  endoscope was straightened and withdrawn, taking circumferential views of  the remaining gastric esophageal mucosa. The patient's vital signs and pulse  oximeter remained stable. The patient tolerated procedure well without  apparent complications.   FINDINGS:  Ulceration, although extensive inflammatory changes. The ulcers  themselves looked fairly flat-based and shallow but clearly present.  Will  await biopsy report.  Will check a gastrin level, and I will follow the  patient with you.      GMO/MEDQ  D:  06/17/2004  T:  06/17/2004  Job:  [id number]   cc:   Dr. Logan Bores

## 2010-10-15 NOTE — Discharge Summary (Signed)
[name], [name]             ACCOUNT NO.:  [id number]   MEDICAL RECORD NO.:  [id number]          PATIENT TYPE:  INP   LOCATION:  0376                         FACILITY:  [facility]   PHYSICIAN:  Jamison Neighbor, M.D.  DATE OF BIRTH:  04/24/1971   DATE OF ADMISSION:  06/14/2004  DATE OF DISCHARGE:  06/19/2004                                 DISCHARGE SUMMARY   DISCHARGE DIAGNOSES:  1.  Chronic pelvic pain.  2.  Interstitial cystitis.  3.  Urgency incontinence.  4.  Bipolar disorder.  5.  Anxiety.  6.  Depression.  7.  Fibromyalgia.  8.  Pseudoseizure disorder.   HISTORY OF PRESENT ILLNESS:  This 40 year old female was admitted to the  hospital on June 14, 2004, for evaluation and treatment of pseudoseizure  disorder along with chronic pelvic pain.  The patient has significant  problems with interstitial cystitis as well as voiding dysfunction.  She has  undergone cystoscopy and hydrodistention several times and also had  InterStim placed.  The patient had her InterStim recently revised which did  help to decrease her frequency and urgency; however, her pelvic pain remains  uncontrolled.  The patient states that the pain has gotten so severe that  she has begun to develop problems with seizures.  The patient was told these  were pseudoseizures and that she had no evidence of epilepsy.  The  psychiatrist and the neurologist recommended that she undergo additional  treatment for her bladder pain, because they felt that her poorly controlled  pelvic pain was leading to sleep deprivation and eventual seizure disorder.   The patient was scheduled to have a cystoscopic examination on June 15, 2004, including hydrodistention.  We note that these have always worked well  for her in the past.  In my office, the patient had 2 seizures that were  witnessed and in one case did fall and hit her head.  For that reason, we  felt that the patient needed to be evaluated with a CT scan, and  she will be  admitted for pain management.   PAST MEDICAL HISTORY:  The patient's past medical history is remarkable for  all the conditions above.  She is also known to have mitral valve prolapse.  She has had chronic migraines, peptic ulcer disease, and chronic  constipation as well as the conditions listed above.   MEDICATIONS ON ADMISSION:  1.  Seroquel 100 mg 3 tablets at night.  2.  Hydroxyzine 75 mg at night.  3.  Valium 5 mg t.i.d.  4.  Depakote 250 mg, 3 tablets b.i.d.  5.  Neurontin 300 mg, 2 tablets b.i.d.  6.  Elmiron 200 mg b.i.d.  7.  Protonix 40 mg daily.  8.  Percocet on a p.r.n. basis.  9.  Phenergan on a p.r.n. basis.  10. Zelnorm b.i.d.  11. Pyridium b.i.d.  12. Verapamil b.i.d.  13. Oral contraceptives.  14. __________.  15. Vicodin for breakthrough pain.   SOCIAL HISTORY:  Unremarkable.  She does work as a Warden/ranger and is  having a difficult time working due to the  pain and her seizure disorder.   PHYSICAL EXAMINATION:  The patient was found to be afebrile and had stable  vital signs.  Her examination was primarily positive for her pelvic pain and  abdominal pain.   HOSPITAL COURSE:  The patient was admitted for pain control with the thought  that she should have a psychiatric evaluation as well as cystoscopy and  hydrodistention.  The patient was started on around-the-clock pain  management as well as instillation therapy into the bladder to be done at  least once daily.   The patient was seen by GI during this hospital stay because she has  previously had GI problems and was seen by Dr. Virginia Rochester.  He did an upper GI  endoscopy and did find evidence of some ulcer disease and recommended an  increase in her Protonix level.   The patient was also seen by Dr. Rico Junker who felt that the diagnosis  of pseudoseizures certainly made sense, and he had no significant  recommendations for anything that needed to be done recently.  Once the  patient's  pain was under control, she really did seem to have less of a  problem with seizures.   The biggest problem she had in the hospital was severe constipation despite  the use of MiraLax.  The patient was given enemas and eventually did have  good bowel movement.   The patient was taken to the operating room where she underwent a  cystoscopic examination and was found to have evidence of interstitial  cystitis.  She did seem to recover from this nicely and was felt to be ready  for discharge by June 19, 2004.  The patient had a little temperature in  the early postoperative period, but then that normalized.  The patient had  some postoperative pain in the bladder but really felt a little bit better  after the procedure was done.   DISCHARGE INSTRUCTIONS:  We recommended she go home with doxycycline twice a  day.  She was also asked to stay on her Atarax 3 pills a day, and we wrote  her a few additional prescriptions including Ambien, Protonix, Lyrica, and  B&O suppositories.  She was asked to continue with her Elmiron, Depakote,  Seroquel, Phenergan, as well as instillations once daily, and the MiraLax  twice daily.  She will return to the office in followup in 2 weeks.  The  patient will have reprogramming at that time to work on her InterStim.      RJE/MEDQ  D:  06/29/2004  T:  06/29/2004  Job:  [id number]
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
## 2010-10-15 NOTE — Discharge Summary (Signed)
[name], [name]             ACCOUNT NO.:  [id number]   MEDICAL RECORD NO.:  [id number]          PATIENT TYPE:  OBV   LOCATION:  1316                         FACILITY:  [facility]   PHYSICIAN:  Iva Boop, MD,FACGDATE OF BIRTH:  10-10-1970   DATE OF ADMISSION:  03/15/2006  DATE OF DISCHARGE:  03/17/2006                                 DISCHARGE SUMMARY   ADMITTING DIAGNOSES.:  51. A 40 year old white female with chronic constipation with severe      exacerbation felt to be combination of motility disorder, narcotic      bowel and possibly pelvic floor dysfunction.  2. Interstitial cystitis status post E-Stim device x2.  3. Bipolar disorder.  4. Chronic pain syndrome with narcotic dependence.  5. Fibromyalgia.  6. Migraine headaches.  7. Hypokalemia secondary to #1.   DISCHARGE DIAGNOSES:  1. Severe exacerbation of constipation again felt a combination of      motility disorder, narcotic bowel and pelvic floor dyssynergy.  2. Interstitial cystitis with E-Stim device, unclear whether the      stimulation device may be adversely affecting anal sphincter      contractions or anal sphincter function.  3. Bipolar disorder.  4. Chronic pain syndrome with narcotic dependence.  5. Fibromyalgia.  6. Migraine headaches.  7. Hypokalemia, resolved.   PROCEDURES:  None.   BRIEF HISTORY:  [name] is a pleasant 40 year old white female with  longstanding interstitial cystitis known to Dr. Logan Bores, narcotic dependence,  intestinal motility disorder, history of gastric ulcer, fibromyalgia,  migraine headaches.  She has had chronic problems with constipation but now  has been refractory to outpatient management.  She has had some chronic  difficulty with nausea and intermittent vomiting as well and has not done  well with any significant volume for a bowel prep, laxatives, etc.  She has  been using senna and B&O suppositories as needed.  She had tried Fleet's  Phospho-Soda prep at home  approximately a week ago.  Did have some minimal  success but says other than that, she has not had a bowel movement for  approximately 6 weeks.  She had had an outpatient Gastrografin BE on March 10, 2006 and had no results with that, according to the patient.  At this  time, she had called back to the office feeling bloated, distended,  nauseated for the past 3-4 days, eating little and was advised to come to  the ER for evaluation.  KUB shows no ileus, moderate constipation.  There  was minimal Gastrografin present in the left colon, potassium was 3.3.  Remainder of labs were unremarkable.  The patient was admitted to  observation status for bowel prep, enemas etc. for her refractory  constipation.   LABORATORY STUDIES:  On October 17, WBC of 7.6, hemoglobin 12.9, hematocrit  of 37.2, MCV of 87.  Potassium was 3.3, creatinine 0.9.  Liver studies  normal.  Albumin 3.5, phosphorus was 3.2, magnesium 2.1.   X-RAY STUDIES:  A KUB as above.   HOSPITAL COURSE:  The patient was admitted to observation status under the  care of Dr. Leone Payor.  She was initially kept on a liquid diet, hydrated,  given morphine as needed for control of pain but encouraged to minimize  this.  She was placed on IV Reglan.  We gave her two separate Fleet's  Phospho-Soda perhaps and several tab water enemas over the course of her  stay.  She had the best results with the tap water enemas and did feel  significantly better by March 17, 2006 and was felt stable for discharge  to home.  She was to follow up with Dr. Leone Payor on October 30 at 3 p.m.  She  was asked to make a follow up with Dr. Jacqulyn Bath at [facility] and is to follow up  with Dr. Logan Bores on November 1 at 11:30 a.m.   Her medications all of the same, see her complete list.  We did add Amitiza  one p.o. b.i.d. and MiraLax 17 grams in 6 ounces of fluid daily.  She was  also asked to do tap water enemas at home every 3-4 days as necessary.   CONDITION ON  DISCHARGE:  Stable and improved.     ______________________________  Mike Gip, PA-C      Iva Boop, MD,FACG  Electronically Signed    AE/MEDQ  D:  03/17/2006  T:  03/19/2006  Job:  [id number]   cc:   Dr. Jacqulyn Bath, [facility]   Dr. Logan Bores

## 2010-10-15 NOTE — Op Note (Signed)
[name], [name]             ACCOUNT NO.:  [id number]   MEDICAL RECORD NO.:  [id number]          PATIENT TYPE:  AMB   LOCATION:  [facility]                         FACILITY:  [facility]   PHYSICIAN:  Jamison Neighbor, M.D.  DATE OF BIRTH:  24-Dec-1970   DATE OF PROCEDURE:  04/06/2004  DATE OF DISCHARGE:                                 OPERATIVE REPORT   PREOPERATIVE DIAGNOSIS:  Malfunctioning InterStim.   POSTOPERATIVE DIAGNOSIS:  Malfunctioning InterStim.   PROCEDURE:  Revision second stage InterStim and placement of new first stage  InterStim.   SURGEON:  Dr. Logan Bores.   ANESTHESIA:  Local plus sedation.   COMPLICATIONS:  None.   DRAINS:  Foley catheter removed in the operating room.   HISTORY:  This 40 year old female had an InterStim placed and had an  excellent response. The patient subsequently fell and landed on the device,  and it has never worked well for her since that time. It has been unclear  whether the generator itself was damaged or if the connector was damaged. X-  rays showed good placement of the first stage lead, and it was felt that the  injury had occurred to the second stage. The patient is now to undergo  second stage revision/replacement. Since she would like to have bilateral  stimulation, a new second stage will be placed as well as a new first stage,  but the first stage on the patient's left will be left in placed. The  patient understands the risks and benefits of the procedure and gave full  informed consent.   PROCEDURE:  After successful induction of adequate IV sedation, the patient  was placed in the prone position, prepped with Betadine and draped in the  usual sterile fashion. Imaging studies showed that the patient had a well  placed lead on the left hand side. The approximate level was marked out, and  the patient then underwent insertion of a lead on the right. Fluoroscopy  demonstrated that this was at the same location. A Bells effect was  obtained. The guide wire was passed down through the frame and the needle  was removed, an incision was made, and a dilating system was used to develop  a track down to the nerve. The quadripolar lead was then passed through the  dilator system and positioned so that optimal effect was obtained. This was  in essence parallel to the opposite side. The patient then had the incision  open, and the old second stage lead was removed. The connector to the  previously placed bladder pole lead was removed. The new right sided first  stage lead was then passed over to pocket. The pocket was placed into a  lower position than the original location at the patient's request. The  original lead was then inserted into the bipolar generator in a lower  position with a silk tie and a clear booty. The new right sided lead was  placed into the upper position with a white booty and a Prolene tie. The  generator was now placed in a position that  was lower on the buttock at  the patient's request. The incision was then  closed with Vicryl sutures and surgical clips. The patient tolerated the  procedure well and was taken to recovery in good condition. She will be  given a prescription for Tylox as well as Keflex and will return to see me  in followup in one week.      RJE/MEDQ  D:  04/06/2004  T:  04/06/2004  Job:  [id number]

## 2010-10-15 NOTE — Consult Note (Signed)
[facility]  Patient:    BENJAMIN, MERRIHEW Visit Number: [id number] MRN: [id number]          Service Type: EMS Location: ED Attending Physician:  Shelba Flake Dictated by:   Dellis Anes. Idell Pickles, M.D. Proc. Date: 05/31/01 Admit Date:  05/31/2001 Discharge Date: 05/31/2001                            Consultation Report  REFERRING PHYSICIAN:  Earlyne Iba, M.D.  PROBLEM:  Abdominal pain.  HISTORY OF PRESENT ILLNESS:  This is a 40 year old white female with a long history of chronic constipation, who at one point was seen in consultation with Griffith Citron, M.D., with recommendations at that time for fairly chronic use of Miralax in addition to high-fiber foods and dietary intake, who has a history of interstitial cystitis.  On December 27 she was seen for that and apparently given a treatment.  On December 28 she developed abdominal distention and had some urinary retention and by history has not had a bowel movement for the past two weeks.  Was seen in the emergency room at [facility] on December 31, where she was catheterized productive of some urine.  Enemas were recommended.  Dulcolax tablets four to six have been used over the past two days without significant production of stool.  Fleets suppositories have also been tried without any significant response.  She was seen on January 2 by Dr. Larey Dresser, who catheterized her again, used an intra-bladder numbing agent.  She was able to urinate x 1 at home tonight but still no bowel movements.  Because of worsening discomfort, she presented to the emergency room at [facility], where she was further evaluated. She has had some nausea without vomiting since the 27th without any documented fever or chills.  She was evaluated by Dr. Margretta Ditty with findings of a negative urine pregnancy test, a normal CBC with a white count of 4600 with normal differential, hemoglobin of 12.2,  normal electrolytes and kidney function, BUN of 20, glucose of 83, creatinine 0.7, a lipase of 17, urine that was negative.  The patient also underwent May 31, 2001, a CT of the abdomen without contrast at [facility], which showed bilateral renal calculi with no findings of urinary tract obstruction and no other abnormalities were seen, including no signs of any intra-abdominal obstructive pattern, no free air or fluid.  She also underwent a pelvic CT, which showed no distal ureteral calculi and no signs of any urinary tract obstruction.  The course in the emergency room has been associated with three injections of Dilaudid for relief of pain, the last one given at 2100 hours.  She has also been given some Zofran at 2100 hours for nausea.  ALLERGIES:  CELEBREX, DURAGESIC, DEMEROL.  MEDICATIONS:  Trazodone 50 mg up to 150 mg at night, Effexor XR 75 mg with Dr. Emerson Monte treating depression, Vioxx 50 mg a day from Dr. Sandria Manly for postconcussion headaches, Ortho Tri-Cyclen.  PHYSICAL EXAMINATION:  GENERAL:  Initially she was tearful but after conversing with the patient for several minutes and her husband, she was much more calm and relaxed.  HEENT:  Negative.  NECK:  Supple, no nodes or masses.  CHEST:  Her lungs are clear.  CARDIAC:  Regular rhythm without tachycardia.  ABDOMEN:  Protuberant, soft, but it is nontender.  I did not appreciate any mass or megaly.  No rebound, no  guarding.  No obstructive sounds.  Her bowel sounds are present and normoactive.  EXTREMITIES:  Without edema.  Pedal pulses are 2+.  At this point far more comfortable after discussing her findings and lab results here in the emergency room.  ASSESSMENT:  Abdominal distention, history of chronic constipation.  No evidence on examination for any intra-abdominal obstruction or infectious process.  Normal chemistries.  PLAN:  The plan tonight will be to send her home and hold off on  instituting any Miralax therapy.  Recommend stopping all analgesic medications other than Tylenol to facilitate normal bowel function.  She is going to check in the office tomorrow by phone and will probably need to get her to see Dr. Kinnie Scales for gastroenterology consultation to try to get her bowel pattern into a more normal and stable pattern.  Again, this problem with constipation that goes over a two to three week period of time has been common for her in the past. Will follow her clinical course. Dictated by:   Dellis Anes Idell Pickles, M.D. Attending Physician:  Shelba Flake DD:  05/31/01 TD:  06/01/01 Job: [id number]

## 2010-10-15 NOTE — H&P (Signed)
[name], [name]             ACCOUNT NO.:  [id number]   MEDICAL RECORD NO.:  [id number]          PATIENT TYPE:  INP   LOCATION:  0345                         FACILITY:  [facility]   PHYSICIAN:  Jamison Neighbor, M.D.  DATE OF BIRTH:  1970-07-18   DATE OF ADMISSION:  06/14/2004  DATE OF DISCHARGE:                                HISTORY & PHYSICAL   ADMISSION DIAGNOSES:  1.  Pseudoseizure disorder.  2.  Chronic pelvic pain.  3.  Interstitial cystitis.  4.  Urgency incontinence.  5.  Bipolar disease.  6.  Anxiety.  7.  Depression.  8.  Fibromyalgia.   HISTORY:  This 40 year old female is well-known to me.  We have been  managing her interstitial cystitis as well as her voiding dysfunction for  sometime. The patient has had cystoscopies __________  and in fact, was  scheduled to have one done tomorrow because of a recent increase in her  bladder pain. We noted that the patient has an InterStim implant which was  just recently revised, and that was working quite well, insofar as urgency  and frequency is concerned.  It did, however work on her pain, which of  course, expected with this device.   Patient's pain had increased and she began to develop problems with  seizures.  She was told that those were pseudoseizures, that she did not  have any evidence of epilepsy. She was told by her psychiatrist that the  pain in her bladder was so severe that it was causing these seizures, and  they recommended she undergo further evaluation for her bladder and her  chronic pain.   Patient presented to my office today with the plan to have her cystoscopy  tomorrow. She had 2 seizures in the office and did fall and hit her head.  For that reason, she was admitted for evaluation to make sure that her head  had not been traumatized, and also, to try to get her pain under control.   PAST MEDICAL HISTORY:  Quite complicated. As noted above, she does have  interstitial cystitis and urgency  incontinence with the placement of her  InterStim.  The patient has this recent problem with pseudoseizures.  She  was known to have murmur, possibly due to mitral valve prolapse.  She has  chronic headaches, borderline bipolar disease, anxiety and depression.  She  has peptic ulcer disease, chronic constipation, fibromyalgia and is aware of  the fact that many of these conditions do seem to be associated with each  other.   PAST SURGICAL HISTORY:  InterStim, as well cystoscopies, but otherwise is  negative.   MEDICATIONS ON ADMISSION:  1.  Seroquel 100 mg q.h.s. 3 tablets at night.  2.  Hydroxyzine 75 mg at night.  3.  Valium 5 mg t.i.d.  4.  Depakote 250 mg 3 tablets b.i.d.  5.  Neurontin 300 mg 2 b.i.d.  6.  Elmiron 200 b.i.d.  7.  Protonix 40 mg daily.  8.  Percocet p.r.n.  9.  Phenergan p.r.n.  10. Zelnorm b.i.d.  11. Pyridium b.i.d.  12. Verapamil b.i.d.  13. Vicodin for breakthrough pain.  14. Oral contraceptives.  She also takes Prelief over-the-counter.   SOCIAL HISTORY:  Patient has not smoked since 2002, she does not use  alcohol.  She does work as a Warden/ranger and is finding it increasingly  difficult to work due to the severity of her pain and her seizure disorder.   PHYSICAL EXAMINATION:  VITAL SIGNS:  Temperature 97.1, pulse 86,  respirations 20, blood pressure 137/81.  GENERAL:  The patient is very sleepy and has almost a postictal appearance.  HEENT:  Normocephalic, atraumatic, cranial nerves II-XII grossly intact.  NECK:  Supple, no adenopathy or thyromegaly.  SPINE:  Straight.  GENITOURINARY:  She had no flank masses or tenderness.  InterStim is well-  positioned and well-healed.  We did not do a pelvic exam.  LUNGS:  Clear.  CARDIOVASCULAR:  Regular rate and rhythm.  No murmurs, rubs, thrills, heaves  or gallops.  ABDOMEN:  Soft, but is much more bloated than normal and we will have to get  some films to make sure she does not have some  obstruction.  EXTREMITIES:  No clubbing, cyanosis or edema.  NEUROLOGIC:  System intact. Patient was really nonfocal at this time.  VASCULAR SYSTEM:  Intact.   IMPRESSION:  1.  Chronic bladder pain.  2.  Interstitial cystitis.  3.  Pseudoseizures.   PLAN:  1.  Pain control.  2.  Possible cystoscopy and hydrodistention.  3.  Possible psychiatric consult.      RJE/MEDQ  D:  06/14/2004  T:  06/14/2004  Job:  [id number]

## 2010-10-15 NOTE — Op Note (Signed)
[name], [name]             ACCOUNT NO.:  [id number]   MEDICAL RECORD NO.:  [id number]          PATIENT TYPE:  AMB   LOCATION:  NESC                         FACILITY:  [facility]   PHYSICIAN:  Jamison Neighbor, M.D.  DATE OF BIRTH:  1970/07/18   DATE OF PROCEDURE:  02/03/2006  DATE OF DISCHARGE:                                 OPERATIVE REPORT   PREOPERATIVE DIAGNOSIS:  Interstitial cystitis.   POSTOPERATIVE DIAGNOSIS:  Interstitial cystitis.   OPERATION PERFORMED:  Cystoscopy, urethral calibration, hydrodistention of  the bladder, Marcaine and Pyridium instillation, Marcaine and Kenalog  injection.   SURGEON:  Jamison Neighbor, M.D.   ANESTHESIA:  General.   COMPLICATIONS:  None.   DRAINS:  None.   BRIEF HISTORY:  This 40 year old female has known interstitial cystitis  along with comorbid  conditions including pelvic floor dysfunction, GI  problems, etc.  The patient has been on home instillation therapy and really  did quite nicely with her interstitial cystitis for some time.  She has had  a recent deterioration of her symptoms and has requested repeat  hydrodistention.  She is aware that there is no guarantee that she will have  comparable responses to what she has had in the past but generally in the  past with hydrodistention she has had good resolution of her symptoms and  improvement for quite some time. She is aware in fact that there is a  theoretical issue that this could actually worsen her symptoms temporarily.  She gave full informed consent for the procedure.   DESCRIPTION OF PROCEDURE:  After successful induction of general anesthesia,  the patient was placed in the dorsal lithotomy position and prepped with  Betadine and draped in the usual sterile fashion.  Careful bimanual  examination revealed no irregularities of the urethra.  There was no sign of  diverticulum.  There was no cystocele, rectocele or enterocele.  The urethra  was calibrated to 64  [place] with female urethral sounds.  The cystoscope was  inserted.  The bladder was carefully inspected.  It was free of any tumor or  stones.  Both ureteral orifices were normal in configuration and location.  Hydrodistention of the bladder was then performed.  The bladder was  distended at a pressure of 100 cm of water for five minutes.  When the  bladder was drained, modest glomerulations could be seen but much improved  over what has previously identified.  Bladder capacity was 600 mL which is  better than the average for interstitial cystitis of 575 and really did  indicate the patient has responded nicely to the treatments.  The bladder  was drained and a mixture of Marcaine and pyridium was left in the bladder.  Marcaine and  Kenalog were injected periurethrally.  The patient tolerated the procedure  and was taken to recovery room in good condition.  She will be sent home  with methadone and doxycycline and will continue on her other medications  and return to see [place] in follow-up.           ______________________________  Jamison Neighbor, M.D.  Electronically Signed     RJE/MEDQ  D:  02/03/2006  T:  02/03/2006  Job:  [id number]

## 2010-10-15 NOTE — Assessment & Plan Note (Signed)
[facility]                           GASTROENTEROLOGY OFFICE NOTE   NAME:[name], [name]                    MRN:          [id number]  DATE:03/08/2006                            DOB:          09/02/1970    CHIEF COMPLAINT:  Constipation.   Markie has been calling the office and getting help with constipation.  It  has been a couple of weeks or so since she had to increase her methadone  dose and she has been more constipated.  She has not had a good bowel  movement in weeks.  She has tried 10 Senokot at a time, water and Colace.  I  called in magnesium citrate, which she kept down, but it did not work.  She  tried to drink CoLyte, but drank one glass and vomited it.  Fleet Phospho-  Soda was taken and stayed down about 45 minutes before she vomited it.  She  has some mild lower abdominal discomfort.  She has not had bleeding.  She  has used several enemas and suppositories and has tried to disimpact.  She  feels distended.  Her interstitial cystitis other than the increase in pain  has not really been bothering her.  We have an updated medication list in  the chart.  She has been on Amitiza in the past, but not in some time.  She  did have an anorectal manometry at [facility] and it was overall basically  normal.  She had poorly defined rectal sensation in paradoxical strain  pattern, but she did have a normal balloon expulsion test or at least a  successful one.  She had done well for awhile until now and it does seem to  be related to the increase in methadone from a 5 mg to 10 mg dose every 6  hours.  She is vomiting some after meals intermittently.   PHYSICAL EXAMINATION:  VITAL SIGNS:  Weigh 135 pounds, pulse 78, blood  pressure 112/62.  ABDOMEN:  Distended, soft.  There is perhaps minimal tenderness.  Bowel  sounds are present, but diminished.  RECTAL:  Rectal exam in the presence of female nursing staff shows  absolutely no stool in  the rectum.  There is normal tone.   PAST MEDICAL HISTORY:  Reviewed and unchanged.   ASSESSMENT:  Constipation in a woman with opioid bowel dysfunction.  She  probably has some pelvic floor problems as well.  She is fairly symptomatic  at this point and has tried multiple treatments that I think would have  worked if she could have kept them down.   PLAN:  Two-view abdomen today.  I will follow up on that and call her  tomorrow.  We might need to have her do a Gastrografin enema or something  like that to try to relieve this constipation if she cannot keep anything  down p.o.  Note, she had some thickening of the colon seen on a CT before  and some fibrotic changes on biopsies performed by Dr. Virginia Rochester.  I had  previously held off on a colonoscopy, but maybe there is some sort of  other  process going on, though it seems unlikely.  It may be worth while to repeat  a colonoscopy and biopsy.       Iva Boop, MD,FACG      CEG/MedQ  DD:  03/08/2006  DT:  03/10/2006  Job #:  [id number]   cc:   Jamison Neighbor, M.D.  Genene Churn. Love, M.D.  Anna Genre Little, M.D.

## 2010-10-15 NOTE — Discharge Summary (Signed)
[name], [name]                         ACCOUNT NO.:  [id number]   MEDICAL RECORD NO.:  [id number]                   PATIENT TYPE:  INP   LOCATION:  0342                                 FACILITY:  [facility]   PHYSICIAN:  Jamison Neighbor, M.D.               DATE OF BIRTH:  26-Feb-1971   DATE OF ADMISSION:  07/31/2003  DATE OF DISCHARGE:  08/07/2003                                 DISCHARGE SUMMARY   DISCHARGE DIAGNOSES:  1. Interstitial cystitis.  2. Irritable bowel syndrome.  3. Gastroparesis.  4. Nausea.  5. Constipation.  6. Chronic migraines.  7. History of seizures.  8. History of kidney stones.   PRINCIPAL PROCEDURE:  1. Cystoscopy and hydrodistention of the bladder performed on July 31, 2003.  2. Esophagogastroduodenoscopy performed on August 05, 2003.   HISTORY:  This 40 year old female is a long-standing patient of Dr. Larey Dresser.  The patient was referred to me for further evaluation of her  chronic pelvic pain and uncontrolled frequency, and it was felt at our  initial evaluation that the patient might be a candidate for InterStim  implantation.  The patient was advised as to the pros and cons of this, and  was tentatively scheduled for August 15, 2003.   The patient contacted the office the week before this admission, and was  seen by Dr. Vonita Moss.  Because of the severity of her pain, the patient came  in via ambulance.  She was given narcotics.  I was asked if the InterStim  could be moved up, but I told the patient there was no way that that would  be done for pain purposes.  This was being simply done for control of her  frequency and urgency incontinence.  When I reevaluated the patient, I felt  the patient's pain had increased and her nausea was worse, and there was  concern about constipation and possible gastroparesis.  The patient was  admitted for pain management, GI evaluation, and possible cystoscopy under  anesthesia.   The patient is known to  have irritable bowel syndrome, as well as  interstitial cystitis and a past history of stones.  She also has a past  history of migraines, and a past history of seizures.  She suffers from  major depression, as well.   MEDICATIONS ON ADMISSION:  1. Oral contraceptive.  2. Zelnorm.  3. Elmiron.  4. __________ .  5. Seroquel.  6. Depakote.  7. Hydroxyzine.  8. Valium.  9. Tylox.  10.      Phenergan.   The patient did note to me that she had had some weight gain, which we  ascribed to the Seroquel.   PHYSICAL EXAMINATION:  Pertinent for some abdominal discomfort, which we  felt was due to her IC and her irritable bowel, as well as chronic  constipation.  The nausea was also thought to be due to poor gastric  motility.  The patient was admitted for pain management, IV fluids, and GI  evaluation.   The patient was seen by Dr. Sabino Gasser, who felt that her problem was due to  decreased gastric emptying because of narcotic use.  He will start her on  Zelnorm as a clinical trial before her meals, and we began her on therapy  for her constipation.  The patient was placed on a proton pump inhibitor,  and also arrangements made for underlying a gastric emptying study and  endoscopy.  The patient did respond to IV fluids, and felt somewhat better,  as her constipation cleared, but we do note that her bladder pain remained  severe.  She was placed on instillations, but they did not help as much as  we might have liked.  On August 05, 2003, she underwent the  esophagogastroduodenoscopy, and was found to have evidence of an ulcer.  Gastric emptying study showed that she had poor emptying, and it was felt  that we needed to decrease both her Depakote and Seroquel and actually try  to remove these.  The patient was started on MiraLax and other agents to try  to eliminate some of her problems with constipation.  On August 06, 2003, we  gave her a soap suds enema, which was somewhat helpful.  The  patient  requested a hydrodistention be done, with the plan that she would be able to  go home afterwards.  The did undergo the hydrodistention on August 07, 2003,  and this confirmed her diagnosis of IC.  The patient did feel better after  this, and was ready for discharge at that time.   DISCHARGE MEDICATIONS:  1. Her prescriptions were for Levaquin just in followup after her     instrumentation.  2. MiraLax for constipation.  3. __________ as needed for burning.  4. Lorcet for pain.  5. Zelnorm for her irritable bowel.  6. Valium p.r.n.  7. Phenergan p.r.n.  8. Atarax p.r.n.  9. We asked her to use these medications for her pain management, as well as     her Lorcet Plus, and we gave her a schedule to wean herself off the     Depakote and the Seroquel.   FOLLOW UP:  The patient will still be scheduled as planned for her InterStim  for the management of her urgency incontinence.                                               Jamison Neighbor, M.D.    RJE/MEDQ  D:  08/19/2003  T:  08/21/2003  Job:  [id number]
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
## 2010-10-15 NOTE — Assessment & Plan Note (Signed)
[facility]                           GASTROENTEROLOGY OFFICE NOTE   NAME:[name], [name] KOLTON                    MRN:          [id number]  DATE:03/28/2006                            DOB:          May 20, 1971    CHIEF COMPLAINT:  Followup on constipation.   [name] was hospitalized for about 36-48 hours because of severe  obstipation and constipation after multiple regimens of laxatives failed to  produce significant bowel movement. She even had a therapeutic Gastrografin  enema. While she was hospitalized, I evaluated her and she seemed to be  having problems with pelvic floor dysfunction. She had turned off her pelvic  floor stimulator which she seems to think is causing trouble for her. I am  not exactly clear how that works, it has been inserted for her pelvic floor  problems and bladder problems. At any rate when she was there when she had  an enema with a long tube and an enema bag where she could have a good  amount like 500 mL, she had the best bowel movements she has had. She has  continued to do that every 3 days or so at home with good bowel movements.  She is having some more nausea and vomiting and wants to stop her Amitiza.  She does not think MiraLax or GlycoLax really helps and she has some mild  welts in the lower extremities.   She is tearful today stating that she has significant problems with medical  bills at this time, and that it would be too costly to go to [name] to see  [name] in followup at this time and she does not really think she wants to  do the biofeedback that he had suggested, what a possibility.   ASSESSMENT:  Functional constipation problems with pelvic floor problems. I  am not exactly certain what the etiology of all this is. When I did do a  rectal exam, she had slightly reduced resting tone and sort of a paradoxical  squeeze where she had a brief flicker of tightening of the anal sphincter  and then it  became weaker. There was no clear cut abnormal pelvic descent.   PLAN:  1. Continue the intermittent enemas and I have written her for an enema      kit to have using warm tap-water enemas.  2. Keep followup with [name] regarding the pelvic stimulator and pelvic      floor dysfunction.  3. She may yet need to go to [name] as this is getting beyond my area of      understanding and ability to provide treatment but at least the enemas      are working right now.     [name] Boop, MD,FACG    CEG/MedQ  DD: 03/28/2006  DT: 03/29/2006  Job #: [id number]   cc:   [name], M.D.  [name] Memos, MD

## 2010-10-15 NOTE — Letter (Signed)
July 03, 2006    Frankey Poot, MD & [facility]  [facility]  [facility]  [address]  [place], [place] [address]   FAX: [phone]   RE:  [name], [name]  MRN:  [id number]  /  DOB:  [birth year]   Ladies and Melton Krebs:   I appreciate your willingness to evaluate [name]. I am referring her  for further evaluation of severe constipation problems. She has a long  history of pelvic floor dysfunction and interstitial cystitis. She is  followed by a urologist here, Dr. Marcelyn Bruins. She has an E-Stim device  placed. Additional medical problems include bipolar disorder, a chronic  pain syndrome and narcotic dependence, fibromyalgia, and migraine  headaches.   I have followed her for about one year. She was previously followed by  local gastroenterologists. She had a gastrostomy tube when she presented  to see me, because of recurrent nausea, vomiting and weight loss. This  was eventually removed. I will enclose the copy of my initial  consultation of June 29, 2005, as well as more recent dictations. The  June 29, 2005, dictation does contain a good review of her problems  to that date.   Since that time in April of 2007, she had the gastrostomy tube removed,  she had had problems with an ulcer in the gastric wall probably  associated with the gastrostomy tube. She has been admitted to the  hospital in October of 2007, with severe constipation problems. She has  been to see Dr. Mellody Memos initially because of motility disturbance and  her vomiting. She did have anorectal manometry there which was not  definitely abnormal as I understand it, but had resting anal sphincter  pressures between 60 and 70 mmHg, squeeze anal sphincter pressure of 120  mmHg, which was normal. She had a positive rectal anal inhibitory reflex  and a poorly defined rectal sensation and strain pattern that was  paradoxical. She did expel a  balloon. He had suggested biofeedback if  she had persistent problems.   Since that time, she has had severe problems with constipation. She is  probably ALLERGIC TO MIRALAX and has failed all other oral laxatives.  She is currently on a regimen of weekly enemas to try to defecate.   I am at a loss as to what else to offer this lady for her chronic  constipation. She certainly has a complicated situation and I suspect  that her pelvic floor dysfunction, opioid use and bipolar disorder all  play some role.   I would appreciate any further thoughts you have regarding her care. I  think the type of multi-disciplinary evaluation provided at your clinic  would be in her best interest.    Sincerely,      Iva Boop, MD,FACG  Electronically Signed    CEG/MedQ  DD: 07/03/2006  DT: 07/03/2006  Job #: [id number]

## 2010-10-15 NOTE — H&P (Signed)
[name]                         ACCOUNT NO.:  [id number]   MEDICAL RECORD NO.:  [id number]                   PATIENT TYPE:  INP   LOCATION:  0342                                 FACILITY:  [facility]   PHYSICIAN:  Jamison Neighbor, M.D.               DATE OF BIRTH:  [birth year]   DATE OF ADMISSION:  07/31/2003  DATE OF DISCHARGE:                                HISTORY & PHYSICAL   ADMISSION DIAGNOSES:  1. Interstitial cystitis.  2. Irritable bowel syndrome.  3. Chronic abdominal pain.  4. Intractable nausea.  5. Chronic migraines.  6. History of seizures.  7. History of kidney stones.   HISTORY:  This 40 year old patient has been a longstanding patient of Dr.  Larey Dresser. The patient was referred to me for further evaluation  because it was felt that there was very little further that could be done  for her interstitial cystitis, and Dr. Vonita Moss wanted to know if I would  consider placing an Interstim implant. The patient was advised as to the  pros and cons of this, and was tentatively scheduled on August 15, 2003.   The patient notified the office last week in my absence and came to see Dr.  Vonita Moss by ambulance due to the severity of her pain. He treated her  empirically with narcotics and tried to make arrangements to see if the  Interstim could be moved up. I saw the patient in the office on July 31, 2003, and told her that the Interstim could not be moved up and in any case  was not being done for pain, but simply for frequency.  I was concerned that  the patient had increasing pain with associated nausea and her situation was  worsening, and I elected to bring her into the hospital for further  evaluation including a GI study.   The patient's past medical history is quite complicated. She is known to  have irritable bowel syndrome. She is also known to have interstitial  cystitis. There is a past history of kidney stones. She has a history of  migraines as  well as a history of seizures. Previous surgery includes  cystoscopy in the past. She also had lithotripsy, arthroscopy. She is also  known to suffer from major depression.   Medications at the time of admission include oral contraceptives, Zelnorm,  Elmiron, Prelief, Seroquel, Depakote, hydroxyzine, valium, Tylox, and  Phenergan. The patient and I did have some discussion about her medication  and she has noted some weight gain which I believe is more than likely due  to the Seroquel.   The patient is allergic to a number of medications including CODEINE,  DEMEROL, DURAGESIC PATCH, SHELLFISH, and OTHER FORMS OF SEAFOOD.   Her social history is unremarkable. The patient stopped smoking in 2002. She  does not use alcohol.   Her review of systems  is pertinent for her chronic pain,  chronic headaches,  problems with weakness on the right hand side, chronic nausea, urgency  incontinence, as well as intermittent pain felt to be due to kidney stones.   PHYSICAL EXAMINATION:  GENERAL: On examination, she is a well-developed,  well-nourished female. At the time of admission the patient is markedly  anxious and noted to have dry heaves during our evaluation.  HEENT: Normocephalic and atraumatic. Cranial nerves II-XII grossly intact.  NECK: Supple with no adenopathy or thyromegaly.  LUNGS: Clear.  HEART: Regular rate and rhythm without murmurs, thrills, gallops, rubs, or  heaves.  BACK: The patient has no true CVA pain.  ABDOMEN: Soft. There is pain across the lower abdomen.  EXTREMITIES: No clubbing, cyanosis, or edema.  NEUROLOGIC: Grossly intact.  VAGINAL EXAM: Not performed at this time.   IMPRESSION:  1. Interstitial cystitis.  2. Irritable bowel syndrome.  3. Chronic abdominal pain with associated nausea.   PLAN:  Admit for pain management and further evaluation of the persistent  nausea.  This will consist of the CT scan, GI evaluation, and additional  studies as necessary. The  patient does know that she cannot have the  Interstim done until we have straightened this out because we are not  willing to place the Interstim in a patient who is chronically nauseated.                                               Jamison Neighbor, M.D.    RJE/MEDQ  D:  08/01/2003  T:  08/01/2003  Job:  [id number]

## 2010-10-15 NOTE — Procedures (Signed)
[facility]  Patient:    [name], [name]                    MRN: [id number] Proc. Date: 08/04/00 Adm. Date:  [id number] Attending:  Thyra Breed CC:         [name], M.D.   Procedure Report  PROCEDURE:  Piriformis trigger point injection of the right buttock and L3-4 facet joint on the right.  ANESTHESIOLOGIST:  Thyra Breed, M.D.  INTERVAL HISTORY: The patient has noted overall improvement with regard to her pain.  Unfortunately, with the oxycodone 15 mg 1 to 2 p.o. q.6h., she has taken this consistently around the clock, and she has taken it to the point where she is sedated from it.  This significantly reduces her pain, but she is also supplementing this with Vicodin, and I advised her this is not a good thing to do.  I discussed in detail the situation with her husband, and I advised him that I really wanted to switch off of the Vicodin and oxycodone 15 mg tablets and advised him to hold on to these medications, and I would write a prescription for MS Contin 30 mg q.8h.  I gave him a prescription for 50.  I advised him I wanted her adequately controlled with regard to her pain to avoid the frequent visits to the emergency room that she has had before. She has burned her left elbow with a heating pad and fell asleep while doing a puzzle, so I advised her that she is really getting too much medication right now.  Of note, she was off of her antidepressants.  She is back on these, and I feel like the combination of the antidepressants plus the oxycodone was a little bit much based on the experience of this past week.  PHYSICAL EXAMINATION:  VITAL SIGNS:  Blood pressure 154/84, heart rate 75, respiratory rate 18, O2 saturation 97%.  BACK:  The patient exhibited tenderness over the right facet joint.  She exhibited tenderness over the right piriformis muscle.  DESCRIPTION OF PROCEDURE:  After informed consent was obtained, the  patient was taken to the fluoroscopy suite were monitors were placed.  She was placed in the prone position with a pillow under her abdomen.  I identified the facet joint under fluoroscopic guidance, prepped out the skin with Betadine x 3, and draped the area.  I anesthetized the skin with a 25-gauge needle using 1% lidocaine.  A 25-gauge needle was introduced into the facet joint confirmed by lateral, oblique, and AP projections.   Aspiration was negative.  I injected 0.5 cc of 1% lidocaine with 20 mg of Medrol and 0.5 cc of 1% lidocaine.  The needle was flushed and removed intact.  The patient was placed on her left side with the left leg extended, right flexed at the hip and knee, and I prepped out the piriformis muscle region identified by making a line from the posterior superior iliac spine to the greater trochanter and another one from the greater trochanter to the sacral hiatus.  At the mid point of the first line, a line was drawn to intersect the second line.  Pressure over this area elicited her discomfort.  The area was prepped with Betadine x 2.  I anesthetized the skin with a 25-gauge needle using 1% lidocaine.   A 25-gauge spinal needle was introduced down to approximate the piriformis muscle, and I injected 3 cc of local anesthetic which consisted  of 3 cc of 1% lidocaine with 3 cc of 0.5% levobupivacaine in a 1:1 ration.  The needle was removed intact.  POSTPROCEDURE CONDITION:  Stable.  DISCHARGE INSTRUCTIONS: 1. Resume previous diet. 2. Limitation of activities per instruction sheet as outlined by my assistant    today. 3. Stop Vicodin and oxycodone. 4. MS Contin 30 mg 1 p.o. q.8h., #50 with no refill.  The patients husband    was given my beeper number and advised to call if she looked like she was    getting oversedated. 5. Continue with physical therapy. 6. Follow up with me in one week to reinject her piriformis muscle region. DD:  08/04/00 TD:  08/05/00 Job:  [id number]

## 2010-10-15 NOTE — Assessment & Plan Note (Signed)
[facility]                           GASTROENTEROLOGY OFFICE NOTE   NAME:[name], [name] ZECHMAN                    MRN:          [id number]  DATE:02/01/2006                            DOB:          10-12-1970    CHIEF COMPLAINT:  Elevated ammonia level.   [name] was seen by Dr. Sandria Manly recently with some difficulty with speech and  some headache which is a chronic issue for her.  She had been confused.  She  was able to give an excellent history of her recent problems at that time.  Cranial nerve examination revealed absence of orbicularis oris muscle and  absence of frontalis muscle on the right.  She was having difficulty with  memory, she says mixing up her words, stuttering speech, and perservation.  She was having difficulty in making a cake.  She has not been exercising and  she had been depressed, but felt that things had improved.  She had an  ammonia level of 99 related to this and may have had one or two other ones  that were elevated.  She has no known history of liver disease.  Another  ammonia level was 98; these were in July.  She has had persistent problems  with this confusion-like picture at times.  I do not think she had the EEG.  She did have a CT of the brain without contrast that was negative.  Her B12  level was 428.  Her TSH was normal.  Previous EEGs have been normal.   She still has some vague left lower quadrant pain at times.  She is due for  hydrodistention of the bladder next week I think.   MEDICATIONS:  Her medications are listed and reviewed in the chart.   PHYSICAL EXAMINATION:  GENERAL:  She is in no acute distress.  VITAL SIGNS:  Weight 133 pounds.  HEENT:  Eyes anicteric.  ABDOMEN:  Mildly tender on the left side of the abdomen without organomegaly  or mass.  Benign abdominal exam.  NEUROLOGIC:  Mental status:  She told me the year.  She could not tell me  the month, she said it was August.  Did not know the date.   She has no  asterixis.  Serial sevens were carried out to 86 and then she froze up on  that one.   ASSESSMENT:  1. Elevated ammonia level, unclear significance.  I seriously doubt this      lady has liver disease causing hepatic encephalopathy.  It may be due      to all her medications.  I suppose it is possible that she has some      sort of late presentation of a urea cycle abnormality.  I checked into      that on up to date, but that would be highly unusual though possible      that she could have that.  2. She brought a letter that she received from Dr. Virginia Rochester stating it was time      for a followup colonoscopy due to colon polyps.  I have no record of  the colonoscopy.  We will need to look into this.   PLAN:  1. Recheck ammonia level, check LFTs.  2. Check into the colon polyp issue and contact the patient regarding      both.  3. Further plans pending the above.                                   Iva Boop, MD,FACG   CEG/MedQ  DD:  02/01/2006  DT:  02/02/2006  Job #:  [id number]   cc:   Jamison Neighbor, M.D.  Genene Churn. Love, M.D.  Anna Genre Little, M.D.

## 2010-10-15 NOTE — Op Note (Signed)
[name], [name] NO.:  [id number]   MEDICAL RECORD NO.:  [id number]                   PATIENT TYPE:   LOCATION:                                       FACILITY:  [facility]   PHYSICIAN:  Jamison Neighbor, M.D.               DATE OF BIRTH:  24-Jan-1971   DATE OF PROCEDURE:  08/15/2003  DATE OF DISCHARGE:                                 OPERATIVE REPORT   PREOPERATIVE DIAGNOSIS:  Urinary incontinence.   POSTOPERATIVE DIAGNOSIS:  Urinary incontinence.   OPERATION/PROCEDURE:  First stage InterStim implantation.   SURGEON:  Jamison Neighbor, M.D.   ASSISTANT:  Thyra Breed, M.D.   ANESTHESIA:  General.   COMPLICATIONS:  None.   DRAINS:  Foley catheter removed in the PACU.   BRIEF HISTORY:  This 40 year old female has severe urgency incontinence.  We  reviewed her voiding diary and noted that her total number of voids per day  is fairly modest at between 10 and 12, but she depletes herself of fluids in  order to prevent uncontrolled frequency and urine loss, and her voiding  volumes are only averaging approximately 40-50 mL.  The patient does have  uncontrolled urgency incontinence and, in addition, has some problems with  difficulty emptying.  The patient has failed to respond to all forms of  therapy, both instillation therapy and oral therapy, and for that reason was  referred to me for evaluation for InterStim implantation.  The patient was  recently hospitalized with some other problems including severe constipation  and upper abdominal pain and that has settled down very nicely, but she is  still having her problems with uncontrolled incontinence.  The patient  understands the risks and benefits of the procedure.  She is aware of the  fact that this is a first stage of a planned two-stage approach and she did  not have a good response to therapy.  We will now proceed with the  implantation of the permanent pulse generator.  She also knows of the  risk  of infection associated with any implantable device.  She gave full informed  consent.  She has been on preoperative antibiotics.  She has done a  Hibiclens shower last night and this morning in preparation for the  procedure.   DESCRIPTION OF PROCEDURE:  After adequate IV sedation was obtained, the  patient was placed in the prone position.  The buttocks were pulled apart  with tape in order to expose the anal sphincter.  The patient received a  full 10-minute prep with both Betadine scrub and Betadine paint.  The  patient was then draped in the usual fashion including a Vi-Drape.  Fluoroscopy was used to identify the level of third sacral nerve.  A foramen  needle was passed down through the foramen on the patient's right-hand side.  Testing on that side showed that this was actually in the fourth sacral  foramen so it was moved one level higher.  On the right-hand side, there was  an excellent bellows effect as well as dorsiflexion of the great toe.  Another needle was placed on the left-hand side and again, good dorsiflexion  of the great toe was obtained and a bellows effect was seen.  It appeared  that stimulation was even better on the left than on the right so a decision  was made to use that device.  An AP film showed a very appropriate medial to  lateral gentle curve to the needle and it appeared that this was optimally  placed, both on AP and lateral films.   A small incision was made and the guidewire was passed down through the  foramen needle which was removed.  The dilating trocar was passed over the  guidewire under fluoroscopic guidance.  The quadripolar lead was then passed  down through the trocar but the tines were not deployed.  The lead was then  positioned so that the bottom portion of the sacrum was between leads 1 and  2.  In that position, leads 1, 2, and 3 all gave excellent bellows effect  and leads 1 and 2 had dorsiflexion of the toe.  Final films were  obtained  which confirmed good placement.  The tunneling tool was used to make a  tunnel from the paramedian incision to a new lateral incision in the top of  the left buttocks.  A small pocket was made in preparation for the eventual  placement of the IPG.  The connection was then made from the new quadripolar  lead to an external lead extender which was brought out through a small  puncture wound on the top of the buttocks.  The protective bootie was  applied and the bootie was tied down with four stitches of silk.  The  connection was then placed down into the pocket.  This was irrigated and  then closed with 2-0 Vicryl and two 3-0 nylon vertical mattress sutures.  A  vertical mattress was placed in this paramedian incision as well.  The  patient had standard dressings applied.  She tolerated the procedure well  and was taken to the recovery room in good condition.   She will be instructed on how to use the InterStim and will be asked to keep  a three-day wound diary before she returns in followup in one weeks.  She  was given a prescription for Tylox for pain as well as Keflex which she will  take three times daily.                                               Jamison Neighbor, M.D.    RJE/MEDQ  D:  08/15/2003  T:  08/15/2003  Job:  [id number]   cc:   Gregary Signs A. Everardo All, M.D. [facility]   [employer]. Vonita Moss, M.D.  [address]  [place]  [place] [address]  Fax: [phone]
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
## 2010-10-15 NOTE — Assessment & Plan Note (Signed)
[facility]                           GASTROENTEROLOGY OFFICE NOTE   NAME:[name], [name]                    MRN:          [id number]  DATE:02/01/2006                            DOB:          September 23, 1970    ADDENDUM:  Her labs have come back with normal LFTs and an ammonia level of  24 which is normal.  I am not sure why she had the elevated ammonia level  before.  I am hard pressed to pursue a workup at this time given this.  I  think we should monitor things.  If there are changes in her behavior again  that suggest a need to draw an ammonia level, we could do so.  I seriously  doubt she has any urea cycle abnormality, that almost always presents at a  young age.   She had some ulceration on a previous colonoscopy and that may be why Dr.  Virginia Rochester had sent her a letter regarding a colon followup.  She received his  standard polyp letter.  I have reviewed the report.  There were really no  polyps seen and in fact the colonoscopy itself did not show mucosal  abnormalities but random biopsies showed some fibrosis and hemosiderin  deposition consistent with ulceration, but again clinically there was no  ulceration.  Thus, I am not inclined to proceed with a colonoscopy at any  time soon as it is not clear to me that it is truly indicated.  I will  discuss this with her and have her come see me next month for followup.                                   Iva Boop, MD,FACG   CEG/MedQ  DD:  02/06/2006  DT:  02/07/2006  Job #:  [id number]   cc:   Jamison Neighbor, M.D.  Genene Churn. Love, M.D.  Anna Genre Little, M.D.
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
## 2010-10-15 NOTE — Op Note (Signed)
NAMERAYMOND, AZURE NO.:  [id number]   MEDICAL RECORD NO.:  [id number]                   PATIENT TYPE:  EMS   LOCATION:  ED                                   FACILITY[facility]  [facility]   PHYSICIAN:  [place] Service, M.D.             DATE OF BIRTH:  11-27-1970   DATE OF PROCEDURE:  DATE OF DISCHARGE:                                 OPERATIVE REPORT   LMD:  Sean A. Everardo All, M.D. [facility]  Dellis Anes. Idell Pickles, M.D.  Kathrin Penner. Vear Clock, M.D.  Genene Churn. Love, M.D.   UROLOGIST:  Maretta Bees. Vonita Moss, M.D.  [employer] Service, M.D.   PREOPERATIVE DIAGNOSIS:  Nonobstructive right renal calculi.  The patient  has obstructive stones within the left kidney and left ureter.  Complicated  urologic history, see H & P from 07/06/02.   POSTOPERATIVE DIAGNOSIS:  Nonobstructive right renal calculi.  The patient  has obstructive stones within the left kidney and left ureter.  Complicated  urologic history, see H & P from 07/06/02.   PROCEDURE:  Cystoscopy, right and left retrograde, left double-J stent  placement, 24 cm x 6 [place].   ANESTHESIA:  General.   DESCRIPTION OF PROCEDURE:  The patient was prepped and draped in the dorsal  lithotomy position after institution of an adequate level of general  anesthesia.  A well-lubricated 22-French panendoscope was gently inserted at  the urethral meatus. Normal urethra and sphincter.  Normal trigone and  orifices.  The bladder showed no obvious tumor, stone, or bleeding site.  Right retrograde showed normal course and caliber of the ureter and pelvis,  nonobstructive stone in the lower pole calyx, prompt drainage of 3-5  minutes.  Left retrograde showed obstruction at the left midureter at about  the L2-L3 level.  With gentle injection of contrast, there was displacement  of what appeared to be about a 5 or 6 cm calcific density into the left  renal pelvis. There were several other nonobstructive calculi within the  left kidney.  A  floppy-tip guide wire was gently advanced into the upper  pole calyces.  A 6-French end-hole catheter was passed over the guide wire  and then withdrawn.  A 6-French 24 cm, double-J stent was then  passed over the guide wire with excellent pigtail formation of guide wire  removal.  There was prompt efflux of concentrated urine through the  fenestrations of the double-J stent.  The bladder was drained.  The  cystoscope was removed.  The patient was given a B&O suppository and  returned to recovery in satisfactory condition.                                               [place] Service, M.D.    RH/MEDQ  D:  07/06/2002  T:  07/06/2002  Job:  [id number]   cc:   Maretta Bees. Vonita Moss, M.D.  [address]  [place]  [place] [address]  Fax: [phone]

## 2010-10-15 NOTE — H&P (Signed)
NAMESANDEEP, DELAGARZA             ACCOUNT NO.:  [id number]   MEDICAL RECORD NO.:  [id number]          PATIENT TYPE:  OBV   LOCATION:  1321                         FACILITY:  [facility]   PHYSICIAN:  Iva Boop, MD,FACGDATE OF BIRTH:  July 24, 1970   DATE OF ADMISSION:  09/13/2006  DATE OF DISCHARGE:                              HISTORY & PHYSICAL   CHIEF COMPLAINT:  Severe epigastric pain.   HISTORY:  [name] is a 40 year old white female known to Dr. Leone [name]  and Dr. Logan Bores chronic pain syndrome, currently maintained on methadone.  She has interstitial cystitis, has had multiple urologic procedures,  chronic constipation, pelvic floor dysfunction.  She has an implanted  pelvic floor stimulator.  She also has history of fibromyalgia,  migraines, bipolar disorder and a seizure disorder.  She has a history  of a gastric ulcer in April 2007, possibly related to G-tube.   The patient is chronically on b.i.d. Nexium.  She now presents with  acute epigastric pain since 3 p.m. yesterday.  She describes it as  constant, sharp, nonradiating, located in the epigastrium.  She is  nauseated but has not vomited.  Has no fever or chills.  No bowel  movement over the past 1-1/2 weeks.  She usually takes an enema about  once every 1-1/2 weeks to produce a bowel movement.  She had recently  been evaluated at [facility] in the functional bowel clinic and had  sitz markers of placed last week after her appointment.  At this time  she is seen and evaluated in the emergency room.  Our labs were  unremarkable.  Abdominal ultrasound is likewise negative for GI  etiologies of her pain.  She does have bilateral renal calculi.  She has  had persistent pain is now admitted for pain control and further  diagnostic workup.   CURRENT MEDICATIONS:  Multiple.   1. Topamax 100 mg q.h.s.  2. Dyazide 37.5 mg q.h.s.  3. Motilium 10 mg two p.o. t.i.d.  4. Albuterol inhaler two puffs q.i.d.  5. Verapamil ER 120 mg  daily.  6. Skelaxin 800 mg daily as needed.  7. Seroquel mg 200 q.h.s.  8. Nitrofurantoin 100 mg daily.  9. Nasacort AQ twice daily.  10.Methadone 10 mg q.i.d.  11.Lamictal 150 mg p.o. daily.  12.Imitrex subcu p.r.n. migraine.  13.Hydroxyzine HCl 100 mg q.h.s.  14.Equetro 400 mg b.i.d.  15.Diazepam 5 mg t.i.d.  16.She instills a bladder cocktail with sodium bicarbonate-lidocaine-      heparin-sodium combination once daily.   ALLERGIES:  Intolerant to seafood, shellfish, fentanyl, question  Demerol, Lyrica, Ambien and MiraLax.   PAST HISTORY:  1. Interstitial cystitis.  2. Multiple prior urologic procedures.  3. History of pelvic floor stimulation device implanted.  4. Migraines.  5. Fibromyalgia.  6. Narcotic dependence.  7. History of gastric ulcer in 2007.  8. Bipolar disorder.  9. Chronic constipation.  10.Pelvic floor dysfunction.   FAMILY HISTORY:  Negative for GI.   SOCIAL HISTORY:  The patient is married.  She is a nonsmoker,  nondrinker.   REVIEW OF SYSTEMS:  HEENT:  No complaint  of current headache, sore  throat, sinus symptoms, etc.  CARDIOVASCULAR:  Negative for chest pain  or anginal symptoms.  PULMONARY:  Negative for cough, shortness of  breath, sputum production.  GI:  As outlined above.  GU:  Chronic  bladder pain with issues as outlined above.   LABORATORY STUDIES:  WBC of 7.1, hemoglobin 14.2, hematocrit of 41.1,  platelets 358.  Potassium 3.1.  Liver function studies normal.  Amylase  82, lipase 17.  Beta HCG is negative.  KUB:  No evidence for  obstruction.  She does have a left renal calculus noted.  Increased  stool in the colon with sitz marker scattered.  Abdominal ultrasound  shows bilateral nonobstructive renal calculi, otherwise negative.   IMPRESSION:  31. A 40 year old white female with acute severe abdominal pain,      etiology not clear.  Rule out gastritis/peptic ulcer disease, pain      secondary to obstipation.  There is no current  evidence for      pancreatitis or cholecystitis.  2. Interstitial cystitis with E-Stim device.  3. Fibromyalgia.  4. Chronic pain syndrome with methadone dependence.  5. Migraines.  6. History of seizure disorder.  7. Bipolar disorder.  8. Pelvic floor dysfunction.  9. Chronic severe constipation.   PLAN:  The patient is admitted for overnight observation for pain  control, IV PPI b.i.d., n.p.o., tap water enemas this evening, and  scheduled for EGD on April 17 with Dr. Leone [name].      Amy Esterwood, PA-C      Iva Boop, MD,FACG  Electronically Signed    AE/MEDQ  D:  09/13/2006  T:  09/14/2006  Job:  [id number]   cc:   Jamison Neighbor, M.D.  Fax: [phone]

## 2010-10-15 NOTE — Op Note (Signed)
NAMESHARONNE, [name]             ACCOUNT NO.:  [id number]   MEDICAL RECORD NO.:  [id number]          PATIENT TYPE:  AMB   LOCATION:  NESC                         FACILITY:  [facility]   PHYSICIAN:  Jamison Neighbor, M.D.  DATE OF BIRTH:  Jul 16, 1970   DATE OF PROCEDURE:  08/14/2006  DATE OF DISCHARGE:                               OPERATIVE REPORT   PREOPERATIVE DIAGNOSIS:  Interstitial cystitis.   POSTOPERATIVE DIAGNOSIS:  Interstitial cystitis.   PROCEDURE:  Cystoscopy, urethral calibration, hydrodistention of the  bladder, Marcaine and Pyridium installation, Marcaine and Kenalog  injection.   SURGEON:  Jamison Neighbor, M.D.   ANESTHESIA:  General.   COMPLICATIONS:  None.   DRAINS:  None.   BRIEF HISTORY:  This 40 year old female has interstitial cystitis.  In  addition she has a number of other complicating problems including  bipolar disorder, pseudoseizures, migraines, irritable bowel syndrome,  pelvic floor dysfunction flap and fibromyalgia.  The patient has had  worsening problems with bladder pain, unresponsive to very aggressive  medical therapy and also aggressive instillation therapy.  The patient  does tend respond to hydrodistention but is aware of the fact that there  is certainly no guarantee that she will have any improvement if she has  a hydrodistention.  She has requested that one be done.  She is a full  understanding of the risks and benefits including the possibility that  this could actually worsen her bladder over time given the fact that she  is narcotic dependent because of her severe pain.  It certainly seems  reasonable to try this since it does tend to give her temporary relief  and other forms of therapy had not been particularly helpful. Full  informed consent was obtained.   PROCEDURE:  After successful induction of general anesthesia the patient  was placed in the dorsal lithotomy position, prepped with Betadine and  draped in the usual sterile  fashion.  Careful bimanual examination  revealed a normal urethra with no signs of a diverticulum.  There was no  cystocele, rectocele or enterocele. There were no masses on bimanual  exam.  The urethra was calibrated 32-French with female urethral sounds.  The cystoscope was inserted.  The bladder was carefully inspected.  No  tumors or stones could be seen, the ureteral orifices were unremarkable  in their appearance, clear urine seen the patient efflux from each.  Hydrodistention of the bladder was performed.  The bladder was distended  at a pressure 100 cm of water for 5 minutes.  When the bladder was  drained, glomerulations could be seen throughout the bladder consistent  with IC.  The bladder capacity was 700 mL.  There  was a terminal blood tinge.  No true ulcers were seen but this certainly  was consistent with an IC situation.  The bladder was drained.  A  mixture of Marcaine and Pyridium was left in the bladder.  Marcaine and  Kenalog were injected periurethrally.  The patient tolerated the  procedure well, was taken to recovery room in good condition.  ______________________________  Jamison Neighbor, M.D.  Electronically Signed     RJE/MEDQ  D:  08/14/2006  T:  08/14/2006  Job:  [id number]   cc:   Gregary Signs A. Everardo All, MD  [address]  [place]  [place] [address]   Iva Boop, MD,FACG  [facility]  [address] [place], [place] [address]

## 2010-10-15 NOTE — Op Note (Signed)
[name], [name]                         ACCOUNT NO.:  [id number]   MEDICAL RECORD NO.:  [id number]                   PATIENT TYPE:  AMB   LOCATION:  NESC                                 FACILITY:  [facility]   PHYSICIAN:  Jamison Neighbor, M.D.               DATE OF BIRTH:  1971-03-31   DATE OF PROCEDURE:  08/29/2003  DATE OF DISCHARGE:                                 OPERATIVE REPORT   PREOPERATIVE DIAGNOSIS:  Urinary incontinence.   POSTOPERATIVE DIAGNOSIS:  Urinary incontinence.   OPERATION/PROCEDURE:  Second stage InterStim implantation.   SURGEON:  Jamison Neighbor, M.D.   ANESTHESIA:  Local with IV sedation.   COMPLICATIONS:  None.   DRAINS:  None.   BRIEF HISTORY:  This 40 year old female is a longstanding patient of Dr.  Larey Dresser.  The patient had not responded to any of the standard forms  of therapy for interstitial cystitis which is the root cause of her problems  with uncontrolled urgency, frequency and urgency incontinence.  The patient  was referred to me for further evaluation.  In the midst of our evaluation,  we discussed the possibility of placing an InterStim implant.  The patient  was intrigued by this idea and was scheduled.   While waiting for surgery, the patient developed intractable upper abdominal  pain.  She was seen in my absence by Dr. Vonita Moss who gave her narcotics.  The patient returned to see me several days later and it was clear that she  was dehydrated in upper abdominal pain.  The patient requested at that time  that the InterStim be moved up.  However, it was clear to me that the pain  was not necessarily bladder in origin and that the InterStim certainly would  not be the treatment of choice.   I admitted the patient to the hospital for rehydration and pain management.  During that hospital stay, she underwent upper GI endoscopy which confirmed  the presence of an ulcer which seemed to be the source of this upper  abdominal  pain.  She also underwent cystoscopy and hydrodistention during  that hospitalization.  The patient stabilized and felt somewhat better but  still had ongoing issues with uncontrolled urgency and frequency.  We  elected to proceed with our first-stage InterStim.  Because the patient  __________ dehydrates herself on a regular basis, her overall number of  voids a day is not as high as it is seen with many IC patients but her  voiding volumes are generally only 50 mL or so.  This is despite the fact  that she has reasonable capacity in the bladder, albeit somewhat diminished  by her IC.  After the first stage was implanted, her voiding volumes  increased up to 175 mL or so and the patient felt that the urgency improved  as well.  She noted today that she feels it is not as perhaps not  as good a  stimulation as it had in the beginning, but she clearly feels it is making a  difference for her.  I have told her that we need to make a decision today  to proceed with the second-stage or elective to remove it.  She clearly  feels it has been helpful for her and would like to proceed.  She does know  that this will require intermittent reprogramming in order to try to  maintain maximal effect and it is clearly not a monotherapy for either her  frequency or her other problems.   The patient is now going to undergo second-stage InterStim implantation.  She understands the risks and benefits of this procedure and gave full  informed consent.   DESCRIPTION OF PROCEDURE:  After successful induction of adequate IV  sedation, the patient was placed in the prone position, prepped with  Betadine and draped in the usual sterile fashion including a Vi-Drape.  The  patient's previous sutures were removed.  Local anesthesia was used to  infiltrate the area where the pocket will be made.  Incision was made  lateral to the previous puncture hole.  A pocket was developed.  The  connection from the previously  placed quadripolar lead to the external lead  was identified and the external lead was cut.  The protected bootie was cut  away and the original quadripolar lead was disconnected.  This was then  connected to the lead extender and the protected bootie was tied down in the  usual fashion with silk sutures.  The lead extender was attached to the  quadripolar lead in the usual fashion.  The pocket was irrigated.  Hemostasis was obtained with electrocautery.  The device was placed down in  the pocket.  Impedance testing showed that all four connections were  appropriate.  The incision was then closed with 2-0 Vicryl and a running  double-crossing suture of 3-0 nylon.  The patient tolerated the procedure  well and was taken to the recovery room in good condition.                                               Jamison Neighbor, M.D.    RJE/MEDQ  D:  08/29/2003  T:  08/29/2003  Job:  [id number]

## 2010-10-15 NOTE — Op Note (Signed)
NAMEKAWANDA, DRUMHELLER             ACCOUNT NO.:  [id number]   MEDICAL RECORD NO.:  [id number]          PATIENT TYPE:  AMB   LOCATION:  NESC                         FACILITY:  [facility]   PHYSICIAN:  Jamison Neighbor, M.D.  DATE OF BIRTH:  02-04-1971   DATE OF PROCEDURE:  08/30/2005  DATE OF DISCHARGE:                                 OPERATIVE REPORT   PREOPERATIVE DIAGNOSIS:  Interstitial cystitis.   POSTOPERATIVE DIAGNOSIS:  Interstitial cystitis.   OPERATION PERFORMED:  Cystoscopy, urethral calibration, hydrodistention of  the bladder, Marcaine Pyridium instillation.  Marcaine and Kenalog  injection.   SURGEON:  Jamison Neighbor, M.D.   ANESTHESIA:  General.   COMPLICATIONS:  None.   DRAINS:  None.   INDICATIONS FOR PROCEDURE:  This 40 year old female has a severe case of  interstitial cystitis.  The patient has requested a repeat hydrodistention  be performed.  She understands the risks and benefits of the procedure and  gave full and informed consent.   DESCRIPTION OF PROCEDURE:  After successful induction of general anesthesia,  the patient was placed in dorsal lithotomy position, prepped with Betadine  and draped in the usual sterile fashion.  Careful bimanual examination  revealed no abnormalities of the urethra or bladder base.  There was no  significant cystocele, rectocele or enterocele.  The urethra was calibrated  to 5 [place] with female urethral sounds with no evidence of stenosis or  stricture.  The cystoscope was inserted.  The bladder was carefully  inspected.  It was free of any tumor or stones.  Both ureteral orifices were  normal in configuration and location.  Hydrodistention of the bladder was  performed at a pressure of 170 cmH2O for five minutes.  When the bladder was  drained, glomerulations could be seen throughout the bladder consistent with  interstitial cystitis.  There was a terminal blood tinge to the drain out  cycle and the patient had a bladder  capacity of 450 mL which is certainly  decidedly abnormal, particularly when compared to a bladder capacity of  1150, which is normal and 575 which is average for interstitial cystitis.  The patient had a mixture of Marcaine and Pyridium left within the bladder.  Marcaine and Kenalog were injected periurethrally.  The  patient tolerated the procedure well and was taken to the recovery room in  good condition. She has adequate pain medication with Tylox.  She is one a  day antibiotics with Macrodantin.  No new prescription were filled, but new  prescriptions were written for Macrodantin, Hydroxy and Valium, a 90 day  supply for her new drug plan.           ______________________________  Jamison Neighbor, M.D.  Electronically Signed     RJE/MEDQ  D:  08/30/2005  T:  08/31/2005  Job:  [id number]   cc:   Gregary Signs A. Everardo All, M.D. [facility]  [address]  [place]  [place] [address]   Iva Boop, M.D. [facility]  [address] [place], [place] [address]

## 2010-10-15 NOTE — Consult Note (Signed)
[place]  Patient:    [name], [name] Visit Number: [id number] MRN: [id number]          Service Type: EMS Location: ED Attending Physician:  Shelba Flake Dictated by:   Dellis Anes. Idell Pickles, M.D. Proc. Date: 05/31/01 Admit Date:  05/31/2001 Discharge Date: 05/31/2001                            Consultation Report  ADDENDUM:  I went back.  Apparently Dr. Margretta Ditty had not done a rectal exam on the patient.  That was performed by this examiner.  It was nontender on palpation of the rectal wall.  There was no stool present in the rectal vault. We are going to proceed with discharge from the emergency room and follow up as an outpatient both in our office and will try to get her back involved with Dr. Sharrell Ku for gastroenterology evaluation. Dictated by:   Dellis Anes Idell Pickles, M.D. Attending Physician:  Shelba Flake DD:  05/31/01 TD:  06/01/01 Job: [id number]
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
## 2010-10-15 NOTE — Op Note (Signed)
[name], [name]                       ACCOUNT NO.:  [id number]   MEDICAL RECORD NO.:  [id number]                   PATIENT TYPE:  AMB   LOCATION:  ENDO                                 FACILITY:  [facility] ([place])   PHYSICIAN:  Georgiana Spinner, M.D.                 DATE OF BIRTH:  Jul 16, 1970   DATE OF PROCEDURE:  DATE OF DISCHARGE:                                 OPERATIVE REPORT   PROCEDURE:  Upper endoscopy.   INDICATIONS:  Previous gastric ulcers seen.  H pylori negative in the past.  Also patient with known gastroparesis and vomiting.   ANESTHESIA:  Demerol 100 mg, Versed 12 mg.   DESCRIPTION OF PROCEDURE:  With the patient minimally sedated despite the  significant amount of medication, in the left lateral decubitus position,  the Olympus videoscopic endoscope was inserted in the mouth and passed under  direct vision into the esophagus which appeared normal.  There was no  evidence of Barrett's.  We entered into the stomach.  The  fundus, body,  antrum, duodenal bulb, and second portion of the duodenum were well  visualized.  From this point, the endoscope was slowly withdrawn taking  circumferential views of the duodenal mucosa until the endoscope was pulled  back into the stomach, placed in retroflexion and viewed the stomach from  below.  The endoscope was then straightened, withdrawn, taking  circumferential views of the remaining gastric and esophageal mucosa.  The  patient's vital signs and pulse oximetry remained stable.  The patient  tolerated the procedure well without apparent complication.   FINDINGS:  Healing of previously noted gastric ulceration with no  abnormalities noted at this time.   PLAN:  Continue present plan of therapy.  Again, the patient's  symptomatology is mostly related to the underlying requirement for narcotic  medication and will have the patient follow up with me as an outpatient.                                               Georgiana Spinner,  M.D.    GMO/MEDQ  D:  10/03/2003  T:  10/03/2003  Job:  [id number]   cc:   Jamison Neighbor, M.D.  [address]  [place]  [place] [address]  Fax: [phone]

## 2010-10-15 NOTE — Discharge Summary (Signed)
NAMELONA, SIX                         ACCOUNT NO.:  [id number]   MEDICAL RECORD NO.:  [id number]                   PATIENT TYPE:  INP   LOCATION:  0367                                 FACILITY:  [facility]   PHYSICIAN:  Maretta Bees. Vonita Moss, M.D.             DATE OF BIRTH:  Oct 15, 1970   DATE OF ADMISSION:  07/08/2002  DATE OF DISCHARGE:  07/12/2002                                 DISCHARGE SUMMARY   FINAL DIAGNOSES:  1. Left flank pain due to inflammation and double J catheter.  2. Left renal calculus.  3. Interstitial cystitis.  4. Depression.  5. History of migraine headaches.  6. Degenerative joint disease secondary to trauma.   PROCEDURE:  ESL on July 11, 2002.   HISTORY:  This 40 year old white female with a history of recurrent stone  disease and UTIs  in addition to interstitial cystitis. He had left flank  pain and required a Cysto and double J catheter placement by Dr. Boston Service on July 06, 2002. He saw in the office on July 08, 2002 and  although the 5 mm stone was up in the kidney from its previous position in  the upper left ureter, she was in a lot of pain and discomfort and running  fevers despite Macrobid. She was admitted after doing an x-ray and seeing  the double J catheter in good place with stones in the kidneys. Physical  exam revealed a young white female in acute distress with guarding in the  left flank to palpation.   HOSPITAL COURSE:  After admission, she ran fevers for two days and she was  put on IV Tequin and IV Rocephin. She was later switched to Septra DS when  her urine culture grew out staph coag negative and the sensitivities were  reviewed. Her white count on admission was 16,600 but came down to 9400.  After two days, she became afebrile and was able to tolerate ESL on July 11, 2002. On July 22, 2002, she was afebrile and feeling much better  with less tenderness in the flank and ready to be discharged on Septra  DS  b.i.d. Her other medications will include Ortho Tri-Cyclen birth control,  Effexor 150 mg daily, trazodone 100 mg at bedtime, Seroquel 100 mg at  bedtime and Topamax 100 mg at bedtime. She should go home on diet and  activity as tolerated. She is sent home in improved condition. She will  return to the office on Monday July 15, 2002 for double J catheter  removal.                                               Maretta Bees. Vonita Moss, M.D.    LJP/MEDQ  D:  07/12/2002  T:  07/12/2002  Job:  [id number]

## 2010-10-15 NOTE — Op Note (Signed)
NAMESKYLLAR, NOTARIANNI             ACCOUNT NO.:  [id number]   MEDICAL RECORD NO.:  [id number]          PATIENT TYPE:  AMB   LOCATION:  ENDO                         FACILITY:  [facility]   PHYSICIAN:  Georgiana Spinner, M.D.    DATE OF BIRTH:  1970/09/21   DATE OF PROCEDURE:  08/11/2004  DATE OF DISCHARGE:                                 OPERATIVE REPORT   PROCEDURE:  Upper endoscopy.   INDICATIONS:  Followup of previous gastric ulcer.   ANESTHESIA:  1.  Demerol 60 mg.  2.  Versed 6 mg.   DESCRIPTION OF PROCEDURE:  With the patient mildly sedated in the left  lateral decubitus position, the Olympus videoscopic endoscope was inserted  in the mouth and passed under direct vision through the esophagus, which  appeared normal, into the stomach.  The fundus and body appeared normal.  There was no evidence of recurrence of previous ulcer.  The antrum showed  some mild erythematous fold and this was photographed and biopsied.  The  duodenal bulb and second portion of the duodenum appeared.  From this point,  the endoscope was slowly withdrawn, taking circumferential views of the  duodenal mucosa until the endoscope had been pulled back into the stomach.  Placed in retroflexion to view the stomach from below.  The endoscope was  straightened and withdrawn, taking circumferential views of the remaining  gastric and esophageal mucosa.  The patient's vital signs and pulse oximetry  remained stable.  The patient tolerated the procedure well without apparent  complications.   FINDINGS:  Mild erythema of the antrum, biopsied.  Otherwise an unremarkable  endoscopic examination.   PLAN:  Await biopsy report.  The patient will call me for results and follow  up with me as needed as an outpatient, but this is healing of previously  noted ulcer.  Will continue present therapy of Protonix and avoidance of  NSAIDs.      GMO/MEDQ  D:  08/11/2004  T:  08/11/2004  Job:  [id number]   cc:   Jamison Neighbor,  M.D.  [address]  [place]  [place] [address]  Fax: [phone]

## 2010-10-15 NOTE — H&P (Signed)
[facility]  Patient:    [name], [name]                    MRN: [id number] Adm. Date:  [id number] Attending:  Thyra Breed CC:         [name], M.D.   History and Physical  This is a followup evaluation.  HISTORY OF PRESENT ILLNESS:  [name] comes in for followup evaluation and consideration for another injection today.  Since her last evaluation, beginning about four days ago, the patient began to have problems with falling.  She notes that whenever she would weightbear on her right leg at times for no reason that she could figure out other than the weightbearing, she would have her right leg give out from underneath her.  She has developed pain over the anterior aspect of the thigh and in the right groin region.  She noted after her last injection back on 3/18, for two weeks she did remarkably well with almost total resolution in her pain.  She felt great overall.  Since that time, I have gotten a note from [facility] which points out that the patient has had difficulties with what sounds like cognitive problems.  She is having difficulties with concentration, comprehension, and inappropriate conversation style.  At her last visit back on 3/18, I had taken her off of all short-acting opiates, and since then she has only been taking MS Contin 30 mg b.i.d., which the patient currently does not feel like is significantly helping her.  In addition, she is on Celexa and clonazepam which were started by her psychiatrist.  The patient feels as though she has returned back to work at too high a rate from previously.  CURRENT MEDICATIONS: 1. Mircette. 2. Celexa. 3. Clonazepam 0.5 mg one q.a.m. and two q.p.m.  PHYSICAL EXAMINATION:  VITAL SIGNS:  Afebrile with heart rate 72, respiratory rate 16, O2 saturations 99%, blood pressure 131/77.  GENERAL:  The patient looked relaxed and was appropriate.  HEENT:  Normocephalic,  atraumatic.  Extraocular movements intact. Conjunctivae and sclerae clear.  Nose patent nares.  Oropharynx is free of lesions.  She had some mild weakness of the right side of her face.  NECK:  Carotids were 2+ and symmetric without bruits.  Neck demonstrated good range of motion with no lymphadenopathy.  LUNGS:  Clear.  HEART:  Regular rate and rhythm.  BREASTS:  Not performed.  ABDOMEN:  Not performed.  RECTAL:  Not performed.  GENITALIA:  Not performed.  BACK:  Negative straight leg raise signs with intact gait.  She was able to stand on her tiptoes, but could not rear back on her right heel without losing her balance.  ______  sign was negative.  Finger-to-nose, heel-to-shin were negative.  EXTREMITIES:  No clubbing, cyanosis, or edema, radial pulses and dorsalis pedis pulses 2+ and symmetric.  She had symmetric bulk and tone.  NEUROLOGIC:  The patient was oriented x 4.  Cranial nerves II-XII grossly intact.  Deep tendon reflexes were symmetric in the upper and lower extremities with downgoing toes.  Motor was 5/5 except for inability to stand on her heels alone on the right side.  Sensory exam was intact to pinprick and vibratory sense.  I reviewed her MRI from January 2002, which was interpreted as normal.  IMPRESSION: 1. Low back pain with radiation to the right groin region now which is    distinctly different from previously where there was more radiation  out to    the right hip and buttock region. 2. Falling episodes which may be related to #1. 3. History of seizure disorder, which sounds like it was related meperidine    toxicity. 4. Depression. 5. Status post multiple surgeries to the TMJ on the right side. 6. History of headache disorder, for which she was followed by Dr. Meryl Crutch. 7. Calcium oxalate renal calculi.  DISPOSITION: 1. I advised the patient that I wished to hold off on any further injections    today until we get her falling episodes a little  bit better defined.  She    plans to see Dr. Sandria Manly later today. 2. Reduce MS Contin to 15 mg one p.o. b.i.d. x 7 days and one p.o. q.p.m. x 7    days #21 with no refill.  She is to stop at the end of that time.  I    advised her that I did not want to continue her on opiates since she got    such a prominent response in the injection at her last visit, and wished to    see how she did with physical therapy.  She is quite amendable to this. 3. Follow up with me in 4 weeks. 4. The patient plans to see Dr. Lodema Hong in the very near future. 5. I advised the patient she needs to discuss the possibility of going ahead    and getting nerve conduction studies done by Dr. Alena Bills group.  I am very    concerned about the fact that she now is having problems with falling which    she had not had before.  I went ahead and had her given 40 mg of Medrol IM,    since she stated this helped her overall just to see whether this globally    will make a difference with regard to her current presentation. DD:  09/01/00 TD:  09/01/00 Job: [id number]

## 2010-10-15 NOTE — Op Note (Signed)
   [name], [name]                         ACCOUNT NO.:  [id number]   MEDICAL RECORD NO.:  [id number]                   PATIENT TYPE:  AMB   LOCATION:  NESC                                 FACILITY:  [facility]   PHYSICIAN:  Maretta Bees. Vonita Moss, M.D.             DATE OF BIRTH:  1971-02-08   DATE OF PROCEDURE:  03/10/2003  DATE OF DISCHARGE:                                 OPERATIVE REPORT   PREOPERATIVE DIAGNOSES:  Interstitial cystitis and urethral syndrome.   POSTOPERATIVE DIAGNOSES:  Interstitial cystitis and urethral syndrome.   PROCEDURE:  Cystoscopy, urethral dilation, hydraulic distention of bladder,  and instillation of Clorpactin.   SURGEON:  Maretta Bees. Vonita Moss, M.D.   ANESTHESIA:  General.   INDICATIONS:  This is a 40 year old white female who has a long history of  interstitial cystitis with exacerbation of her symptoms of late and she is  brought to the OR today for further therapy.   DESCRIPTION OF PROCEDURE:  The patient was brought to the operating room and  placed in the lithotomy position.  The urethra was dilated to 30 [place], the  cystoscope was inserted and the bladder was unremarkable.  She held 650 cc  of water with filling of the bladder at a height of 36 inches.  She then  started leaking around the cystoscope.  I emptied the bladder and looked  back in, she had small petechiae in each quadrant of the bladder.  She was  then treated with 10 minutes of in and out irrigation of 0.4% Clorpactin  solution.  The bladder was then emptied, irrigated with water and Marcaine,  20 cc of 0.25% Marcaine was left in the bladder.  She tolerated the  procedure well and a B&O suppository was inserted and she was taken to the  recovery room in good condition.                                               Maretta Bees. Vonita Moss, M.D.    LJP/MEDQ  D:  03/10/2003  T:  03/10/2003  Job:  [id number]

## 2010-10-15 NOTE — Op Note (Signed)
[name], [name]             ACCOUNT NO.:  [id number]   MEDICAL RECORD NO.:  [id number]          PATIENT TYPE:  INP   LOCATION:  0376                         FACILITY:  [facility]   PHYSICIAN:  Jamison Neighbor, M.D.  DATE OF BIRTH:  1970-06-19   DATE OF PROCEDURE:  06/18/2004  DATE OF DISCHARGE:                                 OPERATIVE REPORT   SERVICE:  Urology.   PREOPERATIVE DIAGNOSES:  Chronic pelvic pain, history of interstitial  cystitis.   POSTOPERATIVE DIAGNOSES:  Chronic pelvic pain, history of interstitial  cystitis.   PROCEDURE:  Cystoscopy, urethral calibration, hydrodistention of the  bladder.   SURGEON:  Jamison Neighbor, M.D.   ANESTHESIA:  General.   COMPLICATIONS:  None.   DRAINS:  16 French Foley catheter.   HISTORY:  This 40 year old female was admitted to the hospital for the  management of chronic pelvic pain and the development of pseudoseizures felt  by the neurologist to be due to her poorly controlled pain.  We noted the  patient is known to have interstitial cystitis and does have her frequency  reasonably well controlled because of the placement of an InterStim implant.  However, her pain is poorly control and this seems to make the patient have  this pesudoseizure disorder. The patient stabilized somewhat during this  hospital stay through the aggressive use of medications and her seizures  have decreased. She is sleeping better with more aggressive therapy. She has  been seen by GI because of some GI pain. She has had her constipation  treated. She has requested that a cystoscopic examination and  hydrodistention be performed to see if that will help her symptoms. She  understands the risks and benefits of the procedure and gave full and  informed consent.   DESCRIPTION OF PROCEDURE:  After successful induction of general anesthesia,  the patient was placed in the dorsal lithotomy position, prepped with  Betadine and draped in the usual  sterile fashion.  Careful examination of  the urethra showed no abnormalities, the bladder was well supported with no  abnormalities detected.  The cystoscope was inserted, the bladder was  carefully inspected and was free of any tumor or stones. Both ureteral  orifices were normal in configuration and location.  Hydrodistention of the  bladder was then performed. The bladder was distended at a pressure of 100  cmH2O for five minutes and when the bladder was drained the bladder capacity  was 600 mL which is very close to the average for IC of 575.  The patient,  however, did not have much in the way of  glomerulations or ulcer formations which would indicate to me that the  medications that she is taking i.e. her Elmiron has really made a major  difference in terms of her bladder.  The patient had a Foley catheter  reinserted, she tolerated the procedure well and was taken to the recovery  room in good condition.      RJE/MEDQ  D:  06/18/2004  T:  06/18/2004  Job:  [id number]

## 2010-10-15 NOTE — Op Note (Signed)
NAMETERIANA, DANKER                         ACCOUNT NO.:  [id number]   MEDICAL RECORD NO.:  [id number]                   PATIENT TYPE:  AMB   LOCATION:  NESC                                 FACILITY:  [facility]   PHYSICIAN:  Maretta Bees. Vonita Moss, M.D.             DATE OF BIRTH:  08-Jun-1970   DATE OF PROCEDURE:  03/20/2002  DATE OF DISCHARGE:                                 OPERATIVE REPORT   PREOPERATIVE DIAGNOSIS:  Left renal calculi and left double-J catheter.   POSTOPERATIVE DIAGNOSIS:  Left renal calculi and left double-J catheter.   PROCEDURE:  1. Cystoscopy.  2. Left retrograde pyelogram.  3. Removal of left double-J catheter.   SURGEON:  Maretta Bees. Vonita Moss, M.D.   ANESTHESIA:  General.   INDICATIONS:  This 40 year old white female, who has had a long urologic  history secondary to problems with interstitial cystitis and urinary tract  calculi was found to be symptomatic from a 3 x 5 mm stone in the left upper  ureter.  Because of severe, persistent pain, she was taken to the operating  room on March 05, 2002, and a cystoscopy, left retrograde pyelogram, and  ureteroscopy were performed along with insertion of a left double-J  catheter.  The upper ureteral stone at that time was pushed back up in the  kidney when the guidewire went up, and she had a narrow, strictured area  just above the upper aspect of the sacroiliac junction where the ureter  straightens out and goes upward.  This area was dilated and in doing so,  developed a ureteral perforation at that point.  Therefore, the double-J  catheter was left in position, and subsequently she underwent lithotripsy of  these left renal calculi last week, and now she has a question of small  stone fragments in the lower pole of the left kidney, and it was felt at  this time it is appropriate to have her double-J catheter removed, but I  want to do a retrograde pyelogram to make sure the ureter was well-healed  and functioning  at this point.   DESCRIPTION OF PROCEDURE:  The patient brought to the operating room and  placed in lithotomy position.  She was cystoscoped and the double-J catheter  brought out just beyond the urethral meatus and a guidewire placed up the  double-J catheter.  The guidewire was then backloaded through a cystoscope,  and an open-ended 5 [place] ureteral catheter was placed up in the left renal  collecting system, and injection of contrast revealed a mildly dilated  pyelocaliceal system.  I then injected contrast as I pulled out the ureteral  catheter, and contrast rapidly traveled down the ureter with no sign of  obstruction or extravasation.  Therefore, I opted to leave out the double-J  catheter at this point, and she was taken to the recovery room in good  condition.  Maretta Bees. Vonita Moss, M.D.    LJP/MEDQ  D:  03/20/2002  T:  03/20/2002  Job:  [id number]

## 2010-10-15 NOTE — H&P (Signed)
[facility]  Patient:    [name], [name]                    MRN: [id number] Adm. Date:  [id number] Attending:  Benita Stabile                         History and Physical  HISTORY OF PRESENT ILLNESS:  I spoke with Dr. Jamie Brookes yesterday about [name].  He has her in the hospital and the workup to date has included a myelogram which apparently did not show any significant pathology to account for her sciatica.  He was asking whether I felt she might benefit from being seen at an inpatient pain management facility and I advised him that to date I had done facette joint injections with no lasting benefits and piriformis injections which resulted in temporary improvement.  The patient has had difficulties following up in physical therapy and has not seen our psychologist here.  She has been on and off of her Celexa and clonazepam.  I expressed concerns about her compliance in following up with ancillary support people.  I advised him that it would probably be beneficial for her to be seen and gave him the names of Dr. Enedina Finner, Dr. Eli Phillips, Dr. Landis Martins at [facility], or Dr. Shelly Bombard at [facility] as potential physicians at academic centers to have her seen by.  She apparently was going to be seen by Dr. Jeral Fruit later in the day.  DD:  10/04/00 TD:  10/04/00 Job: [id number]

## 2010-10-15 NOTE — Op Note (Signed)
[name], [name]                         ACCOUNT NO.:  [id number]   MEDICAL RECORD NO.:  [id number]                   PATIENT TYPE:  INP   LOCATION:  0342                                 FACILITY:  [facility]   PHYSICIAN:  Jamison Neighbor, M.D.               DATE OF BIRTH:  1970/08/20   DATE OF PROCEDURE:  07/31/2003  DATE OF DISCHARGE:                                 OPERATIVE REPORT   SERVICE:  Urology.   PREOPERATIVE DIAGNOSES:  Chronic  pelvic pain, interstitial cystitis.   POSTOPERATIVE DIAGNOSES:  Chronic  pelvic pain, interstitial cystitis.   PROCEDURE:  Cystoscopy, urethral calibration, hydrodistention of the  bladder, Marcaine and Pyridium installation, Marcaine and Kenalog injection.   SURGEON:  Jamison Neighbor, M.D.   ANESTHESIA:  General.   COMPLICATIONS:  None.   DRAINS:  None.   BRIEF HISTORY:  This 40 year old female has been a patient of Dr. Enos Fling  for many years with presumed interstitial cystitis.  The patient has not  responded to most forms of standard therapy and he requested that she be  evaluated for implantation of an Interstim third sacral nerve  neurostimulator.  The patient is felt to be an acceptable candidate for this  because she has uncontrolled urgency and frequency unresponsive to medical  therapy. The patient was scheduled to undergo this implant on August 15, 2003.   Last week the patient developed intractable pain and she came to our office  via ambulance and stated that she had uncontrolled pain along with severe  nausea and vomiting.  She was admitted to the hospital for further  evaluation including GI workup.  The patient's pain has been moderately well  controlled through a combination of morphine pump and around the pump  Toradol but it has been very difficult to stop her nausea.   GI evaluation consisting of gastric emptying studies as well as  esophagogastroduodenoscopy revealed that she has diminished gastric motility  felt to be secondary to her excessive use of narcotic pain medication.  In  addition, the patient is on high doses of both Depakote and Seroquel both of  which will retard her gastric motility. The patient also was found to have  an ulcer that has been biopsied, those results are not yet available.   The GI consultant, Dr. Sabino Gasser, recommended that she be treated with  Reglan, Protonix and Zelnorm for her irritable bowel syndrome. The patient  has also undergone evacuation of her GI tract with combination enemas and  laxatives. The patient is now to undergo cystoscopy and hydrodistention  under anesthesia in order to assess her bladder and also hopefully to  decrease her pelvic pain. The patient understands the risks and benefits of  the procedure and gave full and informed consent.   DESCRIPTION OF PROCEDURE:  After successful induction of general anesthesia,  the patient was placed in the dorsal lithotomy position, prepped with  Betadine and draped in the usual sterile fashion.  Careful bimanual  examination revealed no abnormalities of the urethra. Specifically there was  no evidence of a diverticulum or any urethral discharge. The patient had no  masses on bimanual exam and there was no significant cystocele, rectocele or  enterocele and her uterus was palpable normal. The urethra was calibrated at  84 [place] with female urethral sounds with no evidence of stenosis or  stricture. The cystoscope was inserted, the bladder was carefully inspected  and was free of any tumor or stones. Both ureteral orifices were normal in  configuration and location. Clear urine was seen to efflux from each. The  patient then underwent bladder distention at a pressure of 100 cm of water.  When the bladder was drained, there was no bleeding noted, no glomerulations  were seen, no ulcers could be seen but the bladder was capacity was somewhat  diminished at 750 mL. This would suggest that the patient has  had a  satisfactory response to some of her IC therapy and that previously  described ulcers are no longer present.  We will __________due to the  therapy that she has received in the past with Elmiron as well as her  current heparin installation.  The patient underwent installation of  Marcaine and Pyridium for pain management. Marcaine and Kenalog were  injected periurethrally. She received a B&O suppository and was taken to the  recovery room in good condition.                                               Jamison Neighbor, M.D.    RJE/MEDQ  D:  08/07/2003  T:  08/07/2003  Job:  [id number]

## 2010-11-03 ENCOUNTER — Other Ambulatory Visit: Payer: Self-pay | Admitting: Neurology

## 2010-11-03 DIAGNOSIS — H532 Diplopia: Secondary | ICD-10-CM

## 2010-11-18 ENCOUNTER — Other Ambulatory Visit: Payer: Self-pay | Admitting: Neurology

## 2010-11-18 DIAGNOSIS — H532 Diplopia: Secondary | ICD-10-CM

## 2010-11-19 ENCOUNTER — Emergency Department (HOSPITAL_BASED_OUTPATIENT_CLINIC_OR_DEPARTMENT_OTHER)
Admission: EM | Admit: 2010-11-19 | Discharge: 2010-11-19 | Disposition: A | Discharge status: Home / Self Care | Payer: 59 | Attending: Emergency Medicine | Admitting: Emergency Medicine

## 2010-11-19 ENCOUNTER — Emergency Department (INDEPENDENT_AMBULATORY_CARE_PROVIDER_SITE_OTHER): Payer: 59

## 2010-11-19 DIAGNOSIS — R5383 Other fatigue: Secondary | ICD-10-CM | POA: Insufficient documentation

## 2010-11-19 DIAGNOSIS — R51 Headache: Secondary | ICD-10-CM | POA: Insufficient documentation

## 2010-11-19 DIAGNOSIS — A779 Spotted fever, unspecified: Secondary | ICD-10-CM | POA: Insufficient documentation

## 2010-11-19 DIAGNOSIS — R509 Fever, unspecified: Secondary | ICD-10-CM

## 2010-11-19 DIAGNOSIS — R21 Rash and other nonspecific skin eruption: Secondary | ICD-10-CM | POA: Insufficient documentation

## 2010-11-19 DIAGNOSIS — R5381 Other malaise: Secondary | ICD-10-CM | POA: Insufficient documentation

## 2010-11-19 LAB — GRAM STAIN

## 2010-11-19 LAB — CBC
HCT: 39.7 % (ref 36.0–46.0)
Hemoglobin: 13.3 g/dL (ref 12.0–15.0)
MCH: 26.8 pg (ref 26.0–34.0)
MCHC: 33.5 g/dL (ref 30.0–36.0)
MCV: 79.9 fL (ref 78.0–100.0)
RBC: 4.97 MIL/uL (ref 3.87–5.11)

## 2010-11-19 LAB — CSF CELL COUNT WITH DIFFERENTIAL: Tube #: 4

## 2010-11-19 LAB — COMPREHENSIVE METABOLIC PANEL
ALT: 16 U/L (ref 0–35)
BUN: 10 mg/dL (ref 6–23)
CO2: 25 mEq/L (ref 19–32)
Calcium: 9 mg/dL (ref 8.4–10.5)
Creatinine, Ser: 0.6 mg/dL (ref 0.50–1.10)
GFR calc Af Amer: >60 mL/min (ref >60)
GFR calc non Af Amer: >60 mL/min (ref >60)
Glucose, Bld: 114 mg/dL — ABNORMAL HIGH (ref 70–99)
Sodium: 133 mEq/L — ABNORMAL LOW (ref 135–145)
Total Protein: 6.8 g/dL (ref 6.0–8.3)

## 2010-11-19 LAB — URINALYSIS, ROUTINE W REFLEX MICROSCOPIC
Glucose, UA: NEGATIVE mg/dL
Ketones, ur: NEGATIVE mg/dL
Leukocytes, UA: NEGATIVE
Protein, ur: NEGATIVE mg/dL
Urobilinogen, UA: 1 mg/dL (ref 0.0–1.0)

## 2010-11-19 LAB — DIFFERENTIAL
Basophils Relative: 0 % (ref 0–1)
Lymphocytes Relative: 15 % (ref 12–46)
Lymphs Abs: 0.9 10*3/uL (ref 0.7–4.0)
Monocytes Absolute: 0.3 10*3/uL (ref 0.1–1.0)
Monocytes Relative: 5 % (ref 3–12)
Neutro Abs: 4.5 10*3/uL (ref 1.7–7.7)
Neutrophils Relative %: 76 % (ref 43–77)

## 2010-11-19 LAB — MONONUCLEOSIS SCREEN: Mono Screen: NEGATIVE

## 2010-11-22 LAB — ROCKY MTN SPOTTED FVR AB, IGG-BLOOD: RMSF IgG: 0.04 IV

## 2010-11-23 LAB — CSF CULTURE W GRAM STAIN

## 2010-11-25 LAB — CULTURE, BLOOD (ROUTINE X 2)
Culture  Setup Time: 201206222005
Culture: NO GROWTH
Culture: NO GROWTH

## 2010-12-17 ENCOUNTER — Other Ambulatory Visit (HOSPITAL_COMMUNITY): Payer: Self-pay | Admitting: Neurology

## 2010-12-17 ENCOUNTER — Other Ambulatory Visit: Payer: 59

## 2010-12-17 DIAGNOSIS — H532 Diplopia: Secondary | ICD-10-CM

## 2010-12-21 ENCOUNTER — Other Ambulatory Visit (HOSPITAL_COMMUNITY): Payer: 59

## 2010-12-21 ENCOUNTER — Ambulatory Visit (HOSPITAL_COMMUNITY)
Admission: RE | Admit: 2010-12-21 | Discharge: 2010-12-21 | Disposition: A | Discharge status: Home / Self Care | Payer: 59 | Source: Ambulatory Visit | Attending: Neurology | Admitting: Neurology

## 2010-12-21 DIAGNOSIS — H532 Diplopia: Secondary | ICD-10-CM

## 2011-01-01 ENCOUNTER — Emergency Department (HOSPITAL_BASED_OUTPATIENT_CLINIC_OR_DEPARTMENT_OTHER)
Admission: EM | Admit: 2011-01-01 | Discharge: 2011-01-01 | Disposition: A | Discharge status: Home / Self Care | Payer: 59 | Attending: Emergency Medicine | Admitting: Emergency Medicine

## 2011-01-01 ENCOUNTER — Encounter: Payer: Self-pay | Admitting: *Deleted

## 2011-01-01 DIAGNOSIS — N390 Urinary tract infection, site not specified: Secondary | ICD-10-CM | POA: Insufficient documentation

## 2011-01-01 DIAGNOSIS — M549 Dorsalgia, unspecified: Secondary | ICD-10-CM

## 2011-01-01 DIAGNOSIS — R110 Nausea: Secondary | ICD-10-CM | POA: Insufficient documentation

## 2011-01-01 HISTORY — DX: Interstitial cystitis (chronic) without hematuria: N30.10

## 2011-01-01 LAB — URINALYSIS, ROUTINE W REFLEX MICROSCOPIC
Bilirubin Urine: NEGATIVE
Glucose, UA: NEGATIVE mg/dL
Ketones, ur: NEGATIVE mg/dL
Nitrite: NEGATIVE
Specific Gravity, Urine: 1.024 (ref 1.005–1.030)
pH: 7 (ref 5.0–8.0)

## 2011-01-01 MED ORDER — MORPHINE SULFATE 4 MG/ML IJ SOLN
4.0000 mg | Freq: Once | INTRAMUSCULAR | Status: AC
  Administered 2011-01-01: 4 mg via INTRAMUSCULAR
  Filled 2011-01-01: qty 1

## 2011-01-01 MED ORDER — ONDANSETRON 4 MG PO TBDP
4.0000 mg | ORAL_TABLET | Freq: Once | ORAL | Status: AC
  Administered 2011-01-01: 4 mg via ORAL
  Filled 2011-01-01: qty 1

## 2011-01-01 MED ORDER — KETOROLAC TROMETHAMINE 60 MG/2ML IM SOLN
60.0000 mg | Freq: Once | INTRAMUSCULAR | Status: AC
Start: 2011-01-01 — End: 2011-01-01
  Administered 2011-01-01: 60 mg via INTRAMUSCULAR
  Filled 2011-01-01: qty 2

## 2011-01-01 MED ORDER — METOCLOPRAMIDE HCL 5 MG/ML IJ SOLN
10.0000 mg | Freq: Once | INTRAMUSCULAR | Status: AC
Start: 2011-01-01 — End: 2011-01-01
  Administered 2011-01-01: 10 mg via INTRAMUSCULAR
  Filled 2011-01-01: qty 2

## 2011-01-01 NOTE — ED Provider Notes (Signed)
History     CSN: [id number] Arrival date & time: 01/01/2011  6:45 PM  No chief complaint on file.  HPI Comments: Pt state that the pain was initially on the right side then it went to the left side:pt state that she always has frequency:pt states that with her cystitis she always has some symptoms, so it is hard to distinguish a uti  Patient is a 40 y.o. female presenting with back pain. The history is provided by the patient. No language interpreter was used.  Back Pain  This is a recurrent problem. The current episode started 2 days ago. The problem has been gradually worsening. The pain is present in the lumbar spine. The quality of the pain is described as aching. The pain does not radiate. The pain is the same all the time. Pertinent negatives include no abdominal pain, no bladder incontinence, no paresthesias and no tingling. The treatment provided no relief.    No past medical history on file.  No past surgical history on file.  No family history on file.  History  Substance Use Topics  . Smoking status: Not on file  . Smokeless tobacco: Not on file  . Alcohol Use: Not on file    OB History    No data available      Review of Systems  Gastrointestinal: Positive for nausea. Negative for abdominal pain.  Genitourinary: Negative for bladder incontinence, vaginal bleeding and vaginal discharge.  Musculoskeletal: Positive for back pain.  Neurological: Negative for tingling and paresthesias.  All other systems reviewed and are negative.    Physical Exam  There were no vitals taken for this visit.  Physical Exam  Nursing note and vitals reviewed. Constitutional: She is oriented to person, place, and time. She appears well-developed and well-nourished.  HENT:  Head: Normocephalic and atraumatic.  Cardiovascular: Normal rate and regular rhythm.   Pulmonary/Chest: Effort normal.  Abdominal: Soft. Bowel sounds are normal. There is no tenderness.  Musculoskeletal: Normal  range of motion.  Neurological: She is alert and oriented to person, place, and time.  Skin: Skin is warm and dry.  Psychiatric: She has a normal mood and affect.    ED Course  Procedures  MDM No infection noted in urine:pt states pain is a lot better after the meds:could be related to ic      Teressa Lower, NP 01/01/11 2023

## 2011-01-01 NOTE — ED Provider Notes (Signed)
Medical screening examination/treatment/procedure(s) were performed by non-physician practitioner and as supervising physician I was immediately available for consultation/collaboration.   Alma Mohiuddin W Tisha Cline, MD 01/01/11 2330 

## 2011-02-23 LAB — LIPASE, BLOOD: Lipase: 17

## 2011-02-23 LAB — CBC
RBC: 4.81
WBC: 9.4

## 2011-02-23 LAB — POCT I-STAT, CHEM 8
Hemoglobin: 15
Potassium: 3.2 — ABNORMAL LOW
Sodium: 138
TCO2: 23

## 2011-02-23 LAB — HEPATIC FUNCTION PANEL
ALT: 23
AST: 26
Albumin: 4.2
Alkaline Phosphatase: 55
Total Bilirubin: 0.7

## 2011-02-23 LAB — URINE MICROSCOPIC-ADD ON

## 2011-02-23 LAB — DIFFERENTIAL
Lymphocytes Relative: 28
Lymphs Abs: 2.6
Monocytes Relative: 5
Neutro Abs: 5.8
Neutrophils Relative %: 62

## 2011-02-23 LAB — URINALYSIS, ROUTINE W REFLEX MICROSCOPIC
Glucose, UA: NEGATIVE
Hgb urine dipstick: NEGATIVE
Protein, ur: 30 — AB
Specific Gravity, Urine: 1.027

## 2011-02-23 LAB — URINE CULTURE: Colony Count: NO GROWTH

## 2011-02-23 LAB — POCT PREGNANCY, URINE
Operator id: 29011
Preg Test, Ur: NEGATIVE

## 2011-02-24 LAB — BASIC METABOLIC PANEL
CO2: 25
Calcium: 8.8
Chloride: 106
GFR calc Af Amer: >60
Glucose, Bld: 118 — ABNORMAL HIGH
Potassium: 2.8 — ABNORMAL LOW
Sodium: 139

## 2011-02-24 LAB — CBC
HCT: 33 — ABNORMAL LOW
HCT: 37.8
Hemoglobin: 11.3 — ABNORMAL LOW
Hemoglobin: 12.8
MCHC: 33.9
MCHC: 34.1
MCV: 86.3
Platelets: 262
RBC: 3.82 — ABNORMAL LOW
RDW: 12.4
RDW: 12.4

## 2011-02-24 LAB — COMPREHENSIVE METABOLIC PANEL
Albumin: 4
BUN: 9
Calcium: 9.5
Creatinine, Ser: 1.04
Glucose, Bld: 89
Potassium: 2.9 — ABNORMAL LOW
Total Protein: 6.5

## 2011-02-25 LAB — POCT I-STAT 4, (NA,K, GLUC, HGB,HCT)
Glucose, Bld: 79
HCT: 37
Hemoglobin: 12.6

## 2011-02-25 LAB — POCT PREGNANCY, URINE: Preg Test, Ur: NEGATIVE

## 2011-02-28 LAB — URINALYSIS, ROUTINE W REFLEX MICROSCOPIC
Glucose, UA: NEGATIVE
Ketones, ur: NEGATIVE
Protein, ur: NEGATIVE
Urobilinogen, UA: 1

## 2011-02-28 LAB — CBC
HCT: 37
MCHC: 33.1
MCV: 88.1
RBC: 4.2
WBC: 8.6

## 2011-02-28 LAB — URINE MICROSCOPIC-ADD ON

## 2011-02-28 LAB — DIFFERENTIAL
Eosinophils Absolute: 0.1
Eosinophils Relative: 1
Lymphs Abs: 3.5
Monocytes Absolute: 0.5
Monocytes Relative: 5

## 2011-02-28 LAB — BASIC METABOLIC PANEL
CO2: 28
Chloride: 102
GFR calc Af Amer: >60
Potassium: 3.2 — ABNORMAL LOW

## 2011-03-01 LAB — URINE CULTURE: Culture: NO GROWTH

## 2011-03-01 LAB — URINALYSIS, ROUTINE W REFLEX MICROSCOPIC
Glucose, UA: NEGATIVE
Hgb urine dipstick: NEGATIVE
pH: 6

## 2011-03-01 LAB — POCT I-STAT, CHEM 8
Calcium, Ion: 1.24
Chloride: 103
HCT: 39
Potassium: 3 — ABNORMAL LOW
Sodium: 139

## 2011-03-01 LAB — URINE MICROSCOPIC-ADD ON

## 2011-03-07 ENCOUNTER — Encounter: Payer: Self-pay | Admitting: *Deleted

## 2011-03-07 DIAGNOSIS — F32A Depression, unspecified: Secondary | ICD-10-CM | POA: Insufficient documentation

## 2011-03-07 DIAGNOSIS — F319 Bipolar disorder, unspecified: Secondary | ICD-10-CM | POA: Insufficient documentation

## 2011-03-07 DIAGNOSIS — F329 Major depressive disorder, single episode, unspecified: Secondary | ICD-10-CM | POA: Insufficient documentation

## 2011-03-07 DIAGNOSIS — I509 Heart failure, unspecified: Secondary | ICD-10-CM | POA: Insufficient documentation

## 2011-03-07 DIAGNOSIS — Z87442 Personal history of urinary calculi: Secondary | ICD-10-CM | POA: Insufficient documentation

## 2011-03-07 DIAGNOSIS — G40909 Epilepsy, unspecified, not intractable, without status epilepticus: Secondary | ICD-10-CM | POA: Insufficient documentation

## 2011-03-07 DIAGNOSIS — G43909 Migraine, unspecified, not intractable, without status migrainosus: Secondary | ICD-10-CM | POA: Insufficient documentation

## 2011-03-07 DIAGNOSIS — K259 Gastric ulcer, unspecified as acute or chronic, without hemorrhage or perforation: Secondary | ICD-10-CM | POA: Insufficient documentation

## 2011-03-07 DIAGNOSIS — M797 Fibromyalgia: Secondary | ICD-10-CM | POA: Insufficient documentation

## 2011-03-08 ENCOUNTER — Encounter: Payer: Self-pay | Admitting: Cardiovascular Disease

## 2011-03-08 ENCOUNTER — Ambulatory Visit (INDEPENDENT_AMBULATORY_CARE_PROVIDER_SITE_OTHER): Payer: 59 | Admitting: Cardiovascular Disease

## 2011-03-08 VITALS — BP 147/93 | HR 90 | Ht 63.0 in | Wt 175.8 lb

## 2011-03-08 DIAGNOSIS — I10 Essential (primary) hypertension: Secondary | ICD-10-CM

## 2011-03-08 LAB — POCT I-STAT 4, (NA,K, GLUC, HGB,HCT)
Glucose, Bld: 86
HCT: 40
Operator id: 268271

## 2011-03-08 MED ORDER — LISINOPRIL 10 MG PO TABS: 10.0000 mg | ORAL_TABLET | Freq: Every day | ORAL | Status: DC
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
## 2011-03-08 NOTE — Assessment & Plan Note (Addendum)
Her BP and weight are elevated.  She knows that she is eating too much.  She and her husband have been quite busy with her 29 month-old twins in the p.m. She admits that she's been eating a lot more french fries that she used to.   She will decrease her intake in general and will also try to decrease her intake of salty foods. We also talked about substituting diet drinks for  regular sodas.  We'll add lisinopril 10 mg a day. We'll call her in a 30 day prescription. I'll see her again in 3 months for an office visit as well as basic metabolic profile.

## 2011-03-08 NOTE — Patient Instructions (Addendum)
Your physician wants you to follow-up in: 3 months You will receive a reminder letter in the mail two months in advance. If you don't receive a letter, please call our office to schedule the follow-up appointment.   Your physician has recommended you make the following change in your medication:   1) start lisinopril 10 mg daily for high blood pressure     Your physician recommends that you return for lab work in: 3 months with office visit

## 2011-03-11 LAB — DIFFERENTIAL
Eosinophils Relative: 2
Lymphocytes Relative: 37
Monocytes Absolute: 0.4
Monocytes Relative: 5
Neutro Abs: 4.1

## 2011-03-11 LAB — COMPREHENSIVE METABOLIC PANEL
AST: 25
Albumin: 3.8
Alkaline Phosphatase: 59
BUN: 17
Chloride: 105
Creatinine, Ser: 0.95
GFR calc Af Amer: >60
Potassium: 3.3 — ABNORMAL LOW
Total Protein: 6.8

## 2011-03-11 LAB — CBC
HCT: 34.6 — ABNORMAL LOW
Platelets: 338
RDW: 12.8
WBC: 7.3

## 2011-03-11 LAB — AMYLASE: Amylase: 82

## 2011-03-15 LAB — CBC
HCT: 34 — ABNORMAL LOW
MCHC: 34.4
MCV: 91.9
Platelets: 353
RDW: 13.1
WBC: 9.1

## 2011-03-15 LAB — URINE MICROSCOPIC-ADD ON

## 2011-03-15 LAB — BASIC METABOLIC PANEL
BUN: 14
CO2: 24
Chloride: 105
Glucose, Bld: 91
Potassium: 2.9 — ABNORMAL LOW

## 2011-03-15 LAB — D-DIMER, QUANTITATIVE: D-Dimer, Quant: <0.22

## 2011-03-15 LAB — DIFFERENTIAL
Eosinophils Absolute: 0.1
Eosinophils Relative: 1
Lymphs Abs: 4.5 — ABNORMAL HIGH

## 2011-03-15 LAB — URINALYSIS, ROUTINE W REFLEX MICROSCOPIC
Glucose, UA: NEGATIVE
Hgb urine dipstick: NEGATIVE
Specific Gravity, Urine: 1.021

## 2011-03-15 LAB — URINE CULTURE

## 2011-04-04 ENCOUNTER — Telehealth: Payer: Self-pay | Admitting: Cardiovascular Disease

## 2011-04-04 NOTE — Telephone Encounter (Signed)
New message-pt has cold she wants to know what she can take over the counter

## 2011-04-04 NOTE — Telephone Encounter (Signed)
No phenylepherine, pt informed

## 2011-04-14 ENCOUNTER — Telehealth: Payer: Self-pay | Admitting: Cardiovascular Disease

## 2011-04-14 DIAGNOSIS — I10 Essential (primary) hypertension: Secondary | ICD-10-CM

## 2011-04-14 MED ORDER — DILTIAZEM HCL ER COATED BEADS 240 MG PO CP24: 240.0000 mg | ORAL_CAPSULE | Freq: Every day | ORAL | Status: DC

## 2011-04-14 NOTE — Telephone Encounter (Signed)
cartia 240mg  needs refill, uses [facility]

## 2011-04-14 NOTE — Telephone Encounter (Signed)
Fax Received. Refill Completed. Kayloni Rocco Chowoe (M.A)  

## 2011-04-20 DIAGNOSIS — M797 Fibromyalgia: Secondary | ICD-10-CM | POA: Insufficient documentation

## 2011-04-20 DIAGNOSIS — F319 Bipolar disorder, unspecified: Secondary | ICD-10-CM | POA: Insufficient documentation

## 2011-04-20 DIAGNOSIS — N393 Stress incontinence (female) (male): Secondary | ICD-10-CM | POA: Insufficient documentation

## 2011-04-20 DIAGNOSIS — R300 Dysuria: Secondary | ICD-10-CM | POA: Insufficient documentation

## 2011-04-20 DIAGNOSIS — R3911 Hesitancy of micturition: Secondary | ICD-10-CM | POA: Insufficient documentation

## 2011-04-20 DIAGNOSIS — R350 Frequency of micturition: Secondary | ICD-10-CM | POA: Insufficient documentation

## 2011-04-29 ENCOUNTER — Emergency Department (INDEPENDENT_AMBULATORY_CARE_PROVIDER_SITE_OTHER): Payer: 59

## 2011-04-29 ENCOUNTER — Encounter (HOSPITAL_BASED_OUTPATIENT_CLINIC_OR_DEPARTMENT_OTHER): Payer: Self-pay | Admitting: *Deleted

## 2011-04-29 ENCOUNTER — Emergency Department (HOSPITAL_BASED_OUTPATIENT_CLINIC_OR_DEPARTMENT_OTHER)
Admission: EM | Admit: 2011-04-29 | Discharge: 2011-04-29 | Disposition: A | Discharge status: Home / Self Care | Payer: 59 | Attending: Emergency Medicine | Admitting: Emergency Medicine

## 2011-04-29 DIAGNOSIS — F319 Bipolar disorder, unspecified: Secondary | ICD-10-CM | POA: Insufficient documentation

## 2011-04-29 DIAGNOSIS — Z79899 Other long term (current) drug therapy: Secondary | ICD-10-CM | POA: Insufficient documentation

## 2011-04-29 DIAGNOSIS — I10 Essential (primary) hypertension: Secondary | ICD-10-CM | POA: Insufficient documentation

## 2011-04-29 DIAGNOSIS — N200 Calculus of kidney: Secondary | ICD-10-CM

## 2011-04-29 DIAGNOSIS — R109 Unspecified abdominal pain: Secondary | ICD-10-CM | POA: Insufficient documentation

## 2011-04-29 DIAGNOSIS — I509 Heart failure, unspecified: Secondary | ICD-10-CM | POA: Insufficient documentation

## 2011-04-29 LAB — URINALYSIS, ROUTINE W REFLEX MICROSCOPIC
Bilirubin Urine: NEGATIVE
Hgb urine dipstick: NEGATIVE
Nitrite: NEGATIVE
Protein, ur: NEGATIVE mg/dL
Specific Gravity, Urine: 1.026 (ref 1.005–1.030)
Urobilinogen, UA: 1 mg/dL (ref 0.0–1.0)

## 2011-04-29 MED ORDER — HYDROMORPHONE HCL PF 1 MG/ML IJ SOLN
1.0000 mg | Freq: Once | INTRAMUSCULAR | Status: AC
  Administered 2011-04-29: 1 mg via INTRAVENOUS
  Filled 2011-04-29: qty 1

## 2011-04-29 MED ORDER — ONDANSETRON HCL 4 MG/2ML IJ SOLN
4.0000 mg | Freq: Once | INTRAMUSCULAR | Status: AC
  Administered 2011-04-29: 4 mg via INTRAVENOUS
  Filled 2011-04-29: qty 2

## 2011-04-29 MED ORDER — SODIUM CHLORIDE 0.9 % IV SOLN
Freq: Once | INTRAVENOUS | Status: AC
Start: 2011-04-29 — End: 2011-04-29
  Administered 2011-04-29: 30 mL/h via INTRAVENOUS

## 2011-04-29 NOTE — ED Notes (Signed)
Right flank pain onset 0300 this am had to self cath this am nausea no vomiting
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
## 2011-04-29 NOTE — ED Notes (Signed)
Pt requested pain med-EDPA Sofia notified

## 2011-04-30 NOTE — ED Provider Notes (Signed)
Medical screening examination/treatment/procedure(s) were performed by non-physician practitioner and as supervising physician I was immediately available for consultation/collaboration.   Reuben Knoblock A. Patrica Duel, MD 04/30/11 1122

## 2011-05-05 ENCOUNTER — Other Ambulatory Visit: Payer: Self-pay | Admitting: Orthopedic Surgery

## 2011-05-05 DIAGNOSIS — M545 Low back pain, unspecified: Secondary | ICD-10-CM

## 2011-05-12 ENCOUNTER — Ambulatory Visit
Admission: RE | Admit: 2011-05-12 | Discharge: 2011-05-12 | Disposition: A | Discharge status: Home / Self Care | Payer: 59 | Source: Ambulatory Visit | Attending: Orthopedic Surgery | Admitting: Orthopedic Surgery

## 2011-05-12 ENCOUNTER — Telehealth: Payer: Self-pay | Admitting: Radiology

## 2011-05-12 VITALS — BP 114/66 | HR 83

## 2011-05-12 DIAGNOSIS — M545 Low back pain, unspecified: Secondary | ICD-10-CM

## 2011-05-12 MED ORDER — ONDANSETRON HCL 4 MG/2ML IJ SOLN
4.0000 mg | Freq: Once | INTRAMUSCULAR | Status: AC
  Administered 2011-05-12: 4 mg via INTRAMUSCULAR

## 2011-05-12 MED ORDER — DIAZEPAM 5 MG PO TABS
10.0000 mg | ORAL_TABLET | Freq: Once | ORAL | Status: AC
  Administered 2011-05-12: 10 mg via ORAL

## 2011-05-12 MED ORDER — HYDROMORPHONE HCL PF 1 MG/ML IJ SOLN
1.0000 mg | Freq: Once | INTRAMUSCULAR | Status: AC
  Administered 2011-05-12: 1 mg via INTRAMUSCULAR

## 2011-05-12 MED ORDER — HYDROMORPHONE HCL PF 2 MG/ML IJ SOLN
2.0000 mg | Freq: Once | INTRAMUSCULAR | Status: AC
  Administered 2011-05-12: 2 mg via INTRAMUSCULAR

## 2011-05-12 MED ORDER — IOHEXOL 180 MG/ML  SOLN
10.0000 mL | Freq: Once | INTRAMUSCULAR | Status: AC | PRN
  Administered 2011-05-12: 10 mL via INTRATHECAL

## 2011-05-12 NOTE — Progress Notes (Signed)
Patient states she has been off Cymbalta and Trazadone for the past two days.  jkl

## 2011-05-12 NOTE — Patient Instructions (Signed)
Myelogram  Discharge Instructions  1. Go home and rest quietly for the next 24 hours.  It is important to lie flat for the next 24 hours.  Get up only to go to the restroom.  You may lie in the bed or on a couch on your back, your stomach, your left side or your right side.  You may have one pillow under your head.  You may have pillows between your knees while you are on your side or under your knees while you are on your back.  2. DO NOT drive today.  Recline the seat as far back as it will go, while still wearing your seat belt, on the way home.  3. You may get up to go to the bathroom as needed.  You may sit up for 10 minutes to eat.  You may resume your normal diet and medications unless otherwise indicated.  4. The incidence of headache, nausea, or vomiting is about 5% (one in 20 patients).  If you develop a headache, lie flat and drink plenty of fluids until the headache goes away.  Caffeinated beverages may be helpful.  If you develop severe nausea and vomiting or a headache that does not go away with flat bed rest, call [phone].  5. You may resume normal activities after your 24 hours of bed rest is over; however, do not exert yourself strongly or do any heavy lifting tomorrow.  6. Call your physician for a follow-up appointment.  The results of your myelogram will be sent directly to your physician by the following day.  7. If you have any questions or if complications develop after you arrive home, please call [phone].  Discharge instructions have been explained to the patient.  The patient, or the person responsible for the patient, fully understands these instructions.    Resume cymbalta and trazadone on 05/13/11, after 9:30 am.

## 2011-05-12 NOTE — Telephone Encounter (Signed)
Told pt itching could be from contrast or pain meds given, instructed her to take 50 mg of benadryl and repeat q 6 hours as needed. If she developes swelling or SOB to go immediately to the nearest ER. Dr. Benard Rink made aware.

## 2011-05-13 ENCOUNTER — Other Ambulatory Visit: Payer: 59

## 2011-06-10 DIAGNOSIS — M76899 Other specified enthesopathies of unspecified lower limb, excluding foot: Secondary | ICD-10-CM | POA: Diagnosis not present

## 2011-06-10 DIAGNOSIS — M545 Low back pain, unspecified: Secondary | ICD-10-CM | POA: Diagnosis not present

## 2011-06-10 DIAGNOSIS — M47817 Spondylosis without myelopathy or radiculopathy, lumbosacral region: Secondary | ICD-10-CM | POA: Diagnosis not present

## 2011-06-16 DIAGNOSIS — J019 Acute sinusitis, unspecified: Secondary | ICD-10-CM | POA: Diagnosis not present

## 2011-06-21 DIAGNOSIS — M47817 Spondylosis without myelopathy or radiculopathy, lumbosacral region: Secondary | ICD-10-CM | POA: Diagnosis not present

## 2011-06-21 DIAGNOSIS — M76899 Other specified enthesopathies of unspecified lower limb, excluding foot: Secondary | ICD-10-CM | POA: Diagnosis not present

## 2011-06-27 ENCOUNTER — Other Ambulatory Visit: Payer: Self-pay | Admitting: *Deleted

## 2011-06-27 DIAGNOSIS — I10 Essential (primary) hypertension: Secondary | ICD-10-CM

## 2011-06-27 MED ORDER — LISINOPRIL 10 MG PO TABS: 10.0000 mg | ORAL_TABLET | Freq: Every day | ORAL | Status: DC

## 2011-06-27 NOTE — Telephone Encounter (Signed)
Fax Received. Refill Completed. [name] (R.M.A)   

## 2011-07-07 DIAGNOSIS — M47817 Spondylosis without myelopathy or radiculopathy, lumbosacral region: Secondary | ICD-10-CM | POA: Diagnosis not present

## 2011-07-15 DIAGNOSIS — T887XXA Unspecified adverse effect of drug or medicament, initial encounter: Secondary | ICD-10-CM | POA: Diagnosis not present

## 2011-07-15 DIAGNOSIS — G894 Chronic pain syndrome: Secondary | ICD-10-CM | POA: Diagnosis not present

## 2011-07-15 DIAGNOSIS — M76899 Other specified enthesopathies of unspecified lower limb, excluding foot: Secondary | ICD-10-CM | POA: Diagnosis not present

## 2011-08-24 DIAGNOSIS — N898 Other specified noninflammatory disorders of vagina: Secondary | ICD-10-CM | POA: Diagnosis not present

## 2011-08-24 DIAGNOSIS — M545 Low back pain, unspecified: Secondary | ICD-10-CM | POA: Diagnosis not present

## 2011-09-02 DIAGNOSIS — R3915 Urgency of urination: Secondary | ICD-10-CM | POA: Diagnosis not present

## 2011-09-06 DIAGNOSIS — M76899 Other specified enthesopathies of unspecified lower limb, excluding foot: Secondary | ICD-10-CM | POA: Diagnosis not present

## 2011-09-06 DIAGNOSIS — G894 Chronic pain syndrome: Secondary | ICD-10-CM | POA: Diagnosis not present

## 2011-09-06 DIAGNOSIS — T887XXA Unspecified adverse effect of drug or medicament, initial encounter: Secondary | ICD-10-CM | POA: Diagnosis not present

## 2011-09-09 ENCOUNTER — Ambulatory Visit (INDEPENDENT_AMBULATORY_CARE_PROVIDER_SITE_OTHER): Payer: 59 | Admitting: Cardiovascular Disease

## 2011-09-09 ENCOUNTER — Encounter: Payer: Self-pay | Admitting: Cardiovascular Disease

## 2011-09-09 VITALS — BP 110/74 | HR 84 | Ht 63.0 in | Wt 179.0 lb

## 2011-09-09 DIAGNOSIS — I10 Essential (primary) hypertension: Secondary | ICD-10-CM | POA: Diagnosis not present

## 2011-09-09 LAB — BASIC METABOLIC PANEL
CO2: 24 mEq/L (ref 19–32)
Chloride: 106 mEq/L (ref 96–112)
Creatinine, Ser: 0.6 mg/dL (ref 0.4–1.2)
Potassium: 3.9 mEq/L (ref 3.5–5.1)

## 2011-09-09 LAB — HEPATIC FUNCTION PANEL
AST: 23 U/L (ref 0–37)
Albumin: 4.2 g/dL (ref 3.5–5.2)
Total Bilirubin: 0.4 mg/dL (ref 0.3–1.2)

## 2011-09-09 LAB — LIPID PANEL
HDL: 56.4 mg/dL (ref >39.00)
Total CHOL/HDL Ratio: 4
Triglycerides: 44 mg/dL (ref 0.0–149.0)
VLDL: 8.8 mg/dL (ref 0.0–40.0)

## 2011-09-09 LAB — LDL CHOLESTEROL, DIRECT: Direct LDL: 139.2 mg/dL

## 2011-09-09 MED ORDER — LISINOPRIL 10 MG PO TABS: 10.0000 mg | ORAL_TABLET | Freq: Every day | ORAL | Status: DC

## 2011-09-09 MED ORDER — DILTIAZEM HCL ER COATED BEADS 240 MG PO CP24: 240.0000 mg | ORAL_CAPSULE | Freq: Every day | ORAL | Status: DC

## 2011-09-09 NOTE — Assessment & Plan Note (Addendum)
[name] is doing fairly well. We will refill her medications. We had long discussion about diet exercise and weight loss. I've encouraged her to work out and to lose weight. I think that she could come off a lot of her blood pressure medications if she were to lose weight.  Check fasting labs today. I'll see her again in one year for followup office visit. I've encouraged her to lose weight which will do to help all of her problems.  She also has problems with sleeping at night. This is primarily due to her interstitial cystitis. She'll try to work on getting better control of her interstitial cystitis.

## 2011-09-09 NOTE — Patient Instructions (Signed)
Your physician wants you to follow-up in: 1 year with Dr. Elease Hashimoto.  You will receive a reminder letter in the mail two months in advance. If you don't receive a letter, please call our office to schedule the follow-up appointment.  Labs today:  BMET, Lipids, Liver.
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
## 2011-09-16 DIAGNOSIS — M76899 Other specified enthesopathies of unspecified lower limb, excluding foot: Secondary | ICD-10-CM | POA: Diagnosis not present

## 2011-10-14 DIAGNOSIS — G894 Chronic pain syndrome: Secondary | ICD-10-CM | POA: Diagnosis not present

## 2011-10-14 DIAGNOSIS — M47817 Spondylosis without myelopathy or radiculopathy, lumbosacral region: Secondary | ICD-10-CM | POA: Diagnosis not present

## 2011-10-14 DIAGNOSIS — M76899 Other specified enthesopathies of unspecified lower limb, excluding foot: Secondary | ICD-10-CM | POA: Diagnosis not present

## 2011-10-30 ENCOUNTER — Encounter (HOSPITAL_BASED_OUTPATIENT_CLINIC_OR_DEPARTMENT_OTHER): Payer: Self-pay | Admitting: *Deleted

## 2011-10-30 ENCOUNTER — Emergency Department (HOSPITAL_BASED_OUTPATIENT_CLINIC_OR_DEPARTMENT_OTHER)
Admission: EM | Admit: 2011-10-30 | Discharge: 2011-10-30 | Disposition: A | Discharge status: Home / Self Care | Payer: 59 | Attending: Emergency Medicine | Admitting: Emergency Medicine

## 2011-10-30 DIAGNOSIS — L089 Local infection of the skin and subcutaneous tissue, unspecified: Secondary | ICD-10-CM | POA: Diagnosis not present

## 2011-10-30 DIAGNOSIS — G40909 Epilepsy, unspecified, not intractable, without status epilepticus: Secondary | ICD-10-CM | POA: Insufficient documentation

## 2011-10-30 DIAGNOSIS — I10 Essential (primary) hypertension: Secondary | ICD-10-CM | POA: Insufficient documentation

## 2011-10-30 DIAGNOSIS — F319 Bipolar disorder, unspecified: Secondary | ICD-10-CM | POA: Insufficient documentation

## 2011-10-30 DIAGNOSIS — Z79899 Other long term (current) drug therapy: Secondary | ICD-10-CM | POA: Insufficient documentation

## 2011-10-30 MED ORDER — DIPHENHYDRAMINE HCL 50 MG/ML IJ SOLN
25.0000 mg | Freq: Once | INTRAMUSCULAR | Status: AC
  Administered 2011-10-30: 50 mg via INTRAMUSCULAR
  Filled 2011-10-30: qty 1

## 2011-10-30 MED ORDER — DOXYCYCLINE HYCLATE 100 MG PO CAPS: 100.0000 mg | ORAL_CAPSULE | Freq: Two times a day (BID) | ORAL | Status: AC
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
## 2011-10-30 NOTE — Discharge Instructions (Signed)
Skin Infections A skin infection usually develops as a result of disruption of the skin barrier.  CAUSES  A skin infection might occur following:  Trauma or an injury to the skin such as a cut or insect sting.   Inflammation (as in eczema).   Breaks in the skin between the toes (as in athlete's foot).   Swelling (edema).  SYMPTOMS  The legs are the most common site affected. Usually there is:  Redness.   Swelling.   Pain.   There may be red streaks in the area of the infection.  TREATMENT   Minor skin infections may be treated with topical antibiotics, but if the skin infection is severe, hospital care and intravenous (IV) antibiotic treatment may be needed.   Most often skin infections can be treated with oral antibiotic medicine as well as proper rest and elevation of the affected area until the infection improves.   If you are prescribed oral antibiotics, it is important to take them as directed and to take all the pills even if you feel better before you have finished all of the medicine.   You may apply warm compresses to the area for 20-30 minutes 4 times daily.  You might need a tetanus shot now if:  You have no idea when you had the last one.   You have never had a tetanus shot before.   Your wound had dirt in it.  If you need a tetanus shot and you decide not to get one, there is a rare chance of getting tetanus. Sickness from tetanus can be serious. If you get a tetanus shot, your arm may swell and become red and warm at the shot site. This is common and not a problem. SEEK MEDICAL CARE IF:  The pain and swelling from your infection do not improve within 2 days.  SEEK IMMEDIATE MEDICAL CARE IF:  You develop a fever, chills, or other serious problems.  Document Released: 06/23/2004 Document Revised: 05/05/2011 Document Reviewed: 05/05/2008 Exit[employer] Patient Information 2012 [employer]. 

## 2011-10-30 NOTE — ED Notes (Signed)
Pt states that she felt bad last week and the day after she noticed a small red area to right calf pt states that the areas has gotten bigger and itches thinks she may have been bitten by a spider

## 2011-11-02 NOTE — ED Provider Notes (Signed)
Medical screening examination/treatment/procedure(s) were performed by non-physician practitioner and as supervising physician I was immediately available for consultation/collaboration.   Gwyneth Sprout, MD 11/02/11 1756

## 2011-11-07 DIAGNOSIS — R21 Rash and other nonspecific skin eruption: Secondary | ICD-10-CM | POA: Diagnosis not present

## 2011-11-18 DIAGNOSIS — M47817 Spondylosis without myelopathy or radiculopathy, lumbosacral region: Secondary | ICD-10-CM | POA: Diagnosis not present

## 2011-11-18 DIAGNOSIS — G894 Chronic pain syndrome: Secondary | ICD-10-CM | POA: Diagnosis not present

## 2011-11-18 DIAGNOSIS — IMO0002 Reserved for concepts with insufficient information to code with codable children: Secondary | ICD-10-CM | POA: Diagnosis not present

## 2011-12-09 DIAGNOSIS — G894 Chronic pain syndrome: Secondary | ICD-10-CM | POA: Diagnosis not present

## 2011-12-09 DIAGNOSIS — M76899 Other specified enthesopathies of unspecified lower limb, excluding foot: Secondary | ICD-10-CM | POA: Diagnosis not present

## 2011-12-09 DIAGNOSIS — M47817 Spondylosis without myelopathy or radiculopathy, lumbosacral region: Secondary | ICD-10-CM | POA: Diagnosis not present

## 2011-12-22 ENCOUNTER — Telehealth: Payer: Self-pay | Admitting: *Deleted

## 2011-12-22 ENCOUNTER — Emergency Department (HOSPITAL_BASED_OUTPATIENT_CLINIC_OR_DEPARTMENT_OTHER)
Admission: EM | Admit: 2011-12-22 | Discharge: 2011-12-22 | Disposition: A | Discharge status: Home / Self Care | Payer: 59 | Attending: Emergency Medicine | Admitting: Emergency Medicine

## 2011-12-22 ENCOUNTER — Emergency Department (HOSPITAL_BASED_OUTPATIENT_CLINIC_OR_DEPARTMENT_OTHER): Payer: 59

## 2011-12-22 ENCOUNTER — Encounter (HOSPITAL_BASED_OUTPATIENT_CLINIC_OR_DEPARTMENT_OTHER): Payer: Self-pay | Admitting: Emergency Medicine

## 2011-12-22 DIAGNOSIS — IMO0001 Reserved for inherently not codable concepts without codable children: Secondary | ICD-10-CM | POA: Insufficient documentation

## 2011-12-22 DIAGNOSIS — N949 Unspecified condition associated with female genital organs and menstrual cycle: Secondary | ICD-10-CM | POA: Diagnosis not present

## 2011-12-22 DIAGNOSIS — Z87891 Personal history of nicotine dependence: Secondary | ICD-10-CM | POA: Insufficient documentation

## 2011-12-22 DIAGNOSIS — F319 Bipolar disorder, unspecified: Secondary | ICD-10-CM | POA: Insufficient documentation

## 2011-12-22 DIAGNOSIS — K5900 Constipation, unspecified: Secondary | ICD-10-CM

## 2011-12-22 DIAGNOSIS — I10 Essential (primary) hypertension: Secondary | ICD-10-CM | POA: Insufficient documentation

## 2011-12-22 DIAGNOSIS — N9489 Other specified conditions associated with female genital organs and menstrual cycle: Secondary | ICD-10-CM | POA: Insufficient documentation

## 2011-12-22 DIAGNOSIS — Z79899 Other long term (current) drug therapy: Secondary | ICD-10-CM | POA: Insufficient documentation

## 2011-12-22 LAB — URINALYSIS, ROUTINE W REFLEX MICROSCOPIC
Leukocytes, UA: NEGATIVE
Nitrite: NEGATIVE
Specific Gravity, Urine: 1.018 (ref 1.005–1.030)
Urobilinogen, UA: 1 mg/dL (ref 0.0–1.0)
pH: 6.5 (ref 5.0–8.0)

## 2011-12-22 LAB — PREGNANCY, URINE: Preg Test, Ur: NEGATIVE

## 2011-12-22 MED ORDER — ONDANSETRON 8 MG PO TBDP
8.0000 mg | ORAL_TABLET | ORAL | Status: AC
  Administered 2011-12-22: 8 mg via ORAL
  Filled 2011-12-22: qty 1

## 2011-12-22 MED ORDER — OXYCODONE-ACETAMINOPHEN 5-325 MG PO TABS
1.0000 | ORAL_TABLET | Freq: Once | ORAL | Status: AC
  Administered 2011-12-22: 1 via ORAL
  Filled 2011-12-22: qty 1

## 2011-12-22 MED ORDER — POLYETHYLENE GLYCOL 3350 17 GM/SCOOP PO POWD: 17.0000 g | Freq: Every day | ORAL | Status: AC

## 2011-12-22 NOTE — ED Notes (Signed)
Pt reports constipation, vaginal and rectal pressure/pain and groin pain.

## 2011-12-22 NOTE — Telephone Encounter (Signed)
Spoke with patient and she states she has had constipation problems for several years and feels it has gotten worse. States she is feeling rectal pressure today and would like to be seen. She cannot tell me when her last BM was. She reports taking laxatives and enemas for several lyears. Suggested she call her primary MD or go to ED if she feels she is impacted. Scheduled patient with Dr. Leone Payor on 88/2/13 at 2:15 PM for OV.

## 2011-12-22 NOTE — ED Notes (Signed)
Pt reports that she has constant constipation issues from taking narcotics for interstitial cystitis and chronic back pain. States that she had not been able to have a bowel movement for a week until today, which she states is normal for her. States that she came to the ER today because rectal pain was worse than normal and she thinks rectum may have prolapsed.
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
## 2011-12-22 NOTE — Telephone Encounter (Signed)
Patient complains of severe constipation. She has tried laxatives, enemas etc. With no bowel movement. States "I am afraid I might end up back in the hospital if I dont see someone soon."

## 2011-12-23 ENCOUNTER — Telehealth: Payer: Self-pay | Admitting: Internal Medicine

## 2011-12-23 NOTE — Telephone Encounter (Signed)
Spoke with patient and offered for her to see extender on Monday. She declined this appointment and wants to keep appointment with Dr. Leone Payor.

## 2011-12-29 ENCOUNTER — Encounter: Payer: Self-pay | Admitting: *Deleted

## 2011-12-30 ENCOUNTER — Encounter: Payer: Self-pay | Admitting: Internal Medicine

## 2011-12-30 ENCOUNTER — Ambulatory Visit (INDEPENDENT_AMBULATORY_CARE_PROVIDER_SITE_OTHER): Payer: 59 | Admitting: Internal Medicine

## 2011-12-30 VITALS — BP 110/70 | HR 84 | Ht 63.0 in | Wt 186.6 lb

## 2011-12-30 DIAGNOSIS — K589 Irritable bowel syndrome without diarrhea: Secondary | ICD-10-CM

## 2011-12-30 DIAGNOSIS — K5900 Constipation, unspecified: Secondary | ICD-10-CM

## 2011-12-30 DIAGNOSIS — N8184 Pelvic muscle wasting: Secondary | ICD-10-CM | POA: Diagnosis not present

## 2011-12-30 DIAGNOSIS — K5909 Other constipation: Secondary | ICD-10-CM

## 2011-12-30 DIAGNOSIS — K648 Other hemorrhoids: Secondary | ICD-10-CM

## 2011-12-30 DIAGNOSIS — M6289 Other specified disorders of muscle: Secondary | ICD-10-CM

## 2011-12-30 MED ORDER — LINACLOTIDE 145 MCG PO CAPS: 145.0000 ug | ORAL_CAPSULE | Freq: Every day | ORAL | Status: DC

## 2011-12-30 MED ORDER — HYDROCORTISONE ACE-PRAMOXINE 1-1 % RE CREA: TOPICAL_CREAM | Freq: Two times a day (BID) | RECTAL | Status: AC | PRN

## 2011-12-30 NOTE — Patient Instructions (Addendum)
Samples of Linzess 290 mcg was given today. Please take one by mouth every morning on an empty stomach. Please call Monday or Tuesday with a report if Linzess helped.  We have sent the following medications to your pharmacy for you to pick up at your convenience: Proctocream [facility]   Thank you for choosing me and [facility].  Iva Boop, M.D., [facility]

## 2011-12-30 NOTE — Progress Notes (Signed)
  Subjective:    Patient ID: [name], female    DOB: Jul 28, 1970, 41 y.o.   MRN: [id number]  HPI Patient returns with recurrent constipation. She has had severe problems over the years, previously related to narcotic use as part of treatment for interstitial cystitis. She was able to come off narcotics several years ago and for a while. However within the last year her interstitial cystitis and pain problems including back pain has recurred and she is now back on narcotics. She has been having increasing constipation problems again. She does not get much of an urge and she has extreme difficulty producing a stool. She has known pelvic floor dysfunction. She was in the emergency department within the last week or so with severe distention and pain x-ray findings showing constipation. She is struggling to get relief even though she uses large enema she cannot produce a good bowel movement. Feels like something is bulging or protruding, she's not sure this from the rectum her vagina though she believe she's had something bulging protruding down from the rectum. She's had bright red blood on the tissue paper and on the stool after straining quite a bit. These are similar problems she has had in the past and she's been extensively worked up.   Review of Systems Positive for those things mentioned in the history of present illness.    Objective:   Physical Exam Developed well-nourished overweight white woman in no acute distress Eyes are anicteric Abd soft and nontender Rectal - female staff present - hyperpigmented anoderm. + anal wink, normal tone, tampon palpable  Reduced squeeze and abd contract No descent with weak abd CTR       Assessment & Plan:   1. Chronic constipation mostly related to narcotics though there is probably some dysmotility problem as well   2. IBS (irritable bowel syndrome)   3. Internal hemorrhoid, bleeding   4.  Pelvic floor dysfunction  1. Trial of  linaclotide 290 mg daily 2. She is to call back within several days with results of this therapeutic trial 3. Her pelvic floor dysfunction remains a problem. She has had physical therapy and biofeedback training for this in the past and admits that it has failed.  I appreciate the opportunity to care for this patient.  CC: Mickie Hillier, MD and Marcelyn Bruins, MD

## 2011-12-31 ENCOUNTER — Encounter: Payer: Self-pay | Admitting: Internal Medicine

## 2011-12-31 DIAGNOSIS — M6289 Other specified disorders of muscle: Secondary | ICD-10-CM | POA: Insufficient documentation

## 2012-01-02 ENCOUNTER — Telehealth: Payer: Self-pay | Admitting: Internal Medicine

## 2012-01-02 MED ORDER — NA SULFATE-K SULFATE-MG SULF 17.5-3.13-1.6 GM/177ML PO SOLN: ORAL | Status: DC

## 2012-01-02 NOTE — Telephone Encounter (Signed)
Spoke with patient and she states the Linzess did not work for her. States she did not have a bowel movement when taking it.

## 2012-01-02 NOTE — Telephone Encounter (Signed)
Ok - just skip the dulcolax but  If she can take senokot could use 4 of those - that is not as important as trying the colon purge - so just have her do that at least (i.e. No laxative pills if problematic)

## 2012-01-02 NOTE — Telephone Encounter (Signed)
Patient given recommendations. Suprep up front for pick up. She states it may be Wednesday before she gets by to pick up sample.

## 2012-01-02 NOTE — Telephone Encounter (Signed)
Needs a colon prep  4 dulcolax, wait an hour, then take colon prep  She can have a sample of Prep o-Pik or SuPrep, or we can Rx Colyte  Take whole prep  Call me next day, Linzess may start to work after that, or I will start [place]

## 2012-01-02 NOTE — Telephone Encounter (Signed)
Spoke with patient and she cannot take Dulcolax. She is allergic to it-she gets blisters from it. Please, advise.

## 2012-01-03 ENCOUNTER — Encounter: Payer: Self-pay | Admitting: Internal Medicine

## 2012-01-04 DIAGNOSIS — N3941 Urge incontinence: Secondary | ICD-10-CM | POA: Diagnosis not present

## 2012-01-04 DIAGNOSIS — F329 Major depressive disorder, single episode, unspecified: Secondary | ICD-10-CM | POA: Diagnosis not present

## 2012-01-04 DIAGNOSIS — N393 Stress incontinence (female) (male): Secondary | ICD-10-CM | POA: Diagnosis not present

## 2012-01-04 DIAGNOSIS — R300 Dysuria: Secondary | ICD-10-CM | POA: Diagnosis not present

## 2012-01-04 DIAGNOSIS — IMO0001 Reserved for inherently not codable concepts without codable children: Secondary | ICD-10-CM | POA: Diagnosis not present

## 2012-01-04 DIAGNOSIS — F3289 Other specified depressive episodes: Secondary | ICD-10-CM | POA: Diagnosis not present

## 2012-01-04 DIAGNOSIS — R3915 Urgency of urination: Secondary | ICD-10-CM | POA: Diagnosis not present

## 2012-01-04 DIAGNOSIS — R3911 Hesitancy of micturition: Secondary | ICD-10-CM | POA: Diagnosis not present

## 2012-01-04 DIAGNOSIS — R350 Frequency of micturition: Secondary | ICD-10-CM | POA: Diagnosis not present

## 2012-01-06 DIAGNOSIS — M47817 Spondylosis without myelopathy or radiculopathy, lumbosacral region: Secondary | ICD-10-CM | POA: Diagnosis not present

## 2012-01-16 ENCOUNTER — Other Ambulatory Visit: Payer: Self-pay | Admitting: Cardiovascular Disease

## 2012-01-16 DIAGNOSIS — I10 Essential (primary) hypertension: Secondary | ICD-10-CM

## 2012-01-16 MED ORDER — LISINOPRIL 10 MG PO TABS: 10.0000 mg | ORAL_TABLET | Freq: Every day | ORAL | Status: DC

## 2012-01-19 ENCOUNTER — Telehealth: Payer: Self-pay | Admitting: Internal Medicine

## 2012-01-19 NOTE — Telephone Encounter (Signed)
LM for pt to call back so I can confirm the mcg's on her Linzess that she is requesting be sent in.

## 2012-01-20 ENCOUNTER — Telehealth: Payer: Self-pay

## 2012-01-20 MED ORDER — LINACLOTIDE 290 MCG PO CAPS: 1.0000 | ORAL_CAPSULE | Freq: Every day | ORAL | Status: DC

## 2012-01-20 NOTE — Telephone Encounter (Signed)
Rx sent to pharmacy per her request for the Linzess .  Working for her.  Pt informed that this had been done.  Confirmed mcg with pt.

## 2012-01-26 DIAGNOSIS — F0630 Mood disorder due to known physiological condition, unspecified: Secondary | ICD-10-CM | POA: Diagnosis not present

## 2012-01-26 DIAGNOSIS — F429 Obsessive-compulsive disorder, unspecified: Secondary | ICD-10-CM | POA: Diagnosis not present

## 2012-01-27 DIAGNOSIS — M47817 Spondylosis without myelopathy or radiculopathy, lumbosacral region: Secondary | ICD-10-CM | POA: Diagnosis not present

## 2012-01-31 ENCOUNTER — Ambulatory Visit: Payer: 59 | Admitting: Internal Medicine

## 2012-02-08 DIAGNOSIS — R3915 Urgency of urination: Secondary | ICD-10-CM | POA: Diagnosis not present

## 2012-02-08 DIAGNOSIS — N3941 Urge incontinence: Secondary | ICD-10-CM | POA: Diagnosis not present

## 2012-02-08 DIAGNOSIS — F329 Major depressive disorder, single episode, unspecified: Secondary | ICD-10-CM | POA: Diagnosis not present

## 2012-02-08 DIAGNOSIS — IMO0001 Reserved for inherently not codable concepts without codable children: Secondary | ICD-10-CM | POA: Diagnosis not present

## 2012-02-08 DIAGNOSIS — N3010 Interstitial cystitis (chronic) without hematuria: Secondary | ICD-10-CM | POA: Diagnosis not present

## 2012-02-08 DIAGNOSIS — R350 Frequency of micturition: Secondary | ICD-10-CM | POA: Diagnosis not present

## 2012-02-08 DIAGNOSIS — R3911 Hesitancy of micturition: Secondary | ICD-10-CM | POA: Diagnosis not present

## 2012-02-08 DIAGNOSIS — F3289 Other specified depressive episodes: Secondary | ICD-10-CM | POA: Diagnosis not present

## 2012-02-08 DIAGNOSIS — R300 Dysuria: Secondary | ICD-10-CM | POA: Diagnosis not present

## 2012-02-29 DIAGNOSIS — N3010 Interstitial cystitis (chronic) without hematuria: Secondary | ICD-10-CM | POA: Diagnosis not present

## 2012-02-29 DIAGNOSIS — R102 Pelvic and perineal pain: Secondary | ICD-10-CM | POA: Insufficient documentation

## 2012-02-29 DIAGNOSIS — R350 Frequency of micturition: Secondary | ICD-10-CM | POA: Diagnosis not present

## 2012-02-29 DIAGNOSIS — R3911 Hesitancy of micturition: Secondary | ICD-10-CM | POA: Diagnosis not present

## 2012-02-29 DIAGNOSIS — R109 Unspecified abdominal pain: Secondary | ICD-10-CM | POA: Diagnosis not present

## 2012-02-29 DIAGNOSIS — R3915 Urgency of urination: Secondary | ICD-10-CM | POA: Diagnosis not present

## 2012-02-29 DIAGNOSIS — R300 Dysuria: Secondary | ICD-10-CM | POA: Diagnosis not present

## 2012-02-29 DIAGNOSIS — M6289 Other specified disorders of muscle: Secondary | ICD-10-CM | POA: Insufficient documentation

## 2012-02-29 DIAGNOSIS — N3941 Urge incontinence: Secondary | ICD-10-CM | POA: Diagnosis not present

## 2012-02-29 DIAGNOSIS — IMO0001 Reserved for inherently not codable concepts without codable children: Secondary | ICD-10-CM | POA: Diagnosis not present

## 2012-03-01 ENCOUNTER — Emergency Department (HOSPITAL_COMMUNITY)
Admission: EM | Admit: 2012-03-01 | Discharge: 2012-03-02 | Disposition: A | Discharge status: Home / Self Care | Payer: 59 | Attending: Emergency Medicine | Admitting: Emergency Medicine

## 2012-03-01 ENCOUNTER — Encounter (HOSPITAL_COMMUNITY): Payer: Self-pay

## 2012-03-01 ENCOUNTER — Emergency Department (HOSPITAL_COMMUNITY): Payer: 59

## 2012-03-01 DIAGNOSIS — I10 Essential (primary) hypertension: Secondary | ICD-10-CM | POA: Diagnosis not present

## 2012-03-01 DIAGNOSIS — N200 Calculus of kidney: Secondary | ICD-10-CM | POA: Diagnosis not present

## 2012-03-01 DIAGNOSIS — R109 Unspecified abdominal pain: Secondary | ICD-10-CM

## 2012-03-01 DIAGNOSIS — D72829 Elevated white blood cell count, unspecified: Secondary | ICD-10-CM | POA: Diagnosis not present

## 2012-03-01 LAB — CBC WITH DIFFERENTIAL/PLATELET
Basophils Absolute: 0 10*3/uL (ref 0.0–0.1)
Eosinophils Absolute: 0 10*3/uL (ref 0.0–0.7)
Lymphocytes Relative: 19 % (ref 12–46)
Lymphs Abs: 2.7 10*3/uL (ref 0.7–4.0)
Neutrophils Relative %: 78 % — ABNORMAL HIGH (ref 43–77)
Platelets: 318 10*3/uL (ref 150–400)
RBC: 5.03 MIL/uL (ref 3.87–5.11)
RDW: 13.2 % (ref 11.5–15.5)
WBC: 14.4 10*3/uL — ABNORMAL HIGH (ref 4.0–10.5)

## 2012-03-01 LAB — URINALYSIS, ROUTINE W REFLEX MICROSCOPIC
Glucose, UA: NEGATIVE mg/dL
Hgb urine dipstick: NEGATIVE
Leukocytes, UA: NEGATIVE
Protein, ur: NEGATIVE mg/dL
pH: 5.5 (ref 5.0–8.0)

## 2012-03-01 LAB — COMPREHENSIVE METABOLIC PANEL
ALT: 28 U/L (ref 0–35)
AST: 21 U/L (ref 0–37)
Alkaline Phosphatase: 118 U/L — ABNORMAL HIGH (ref 39–117)
CO2: 26 mEq/L (ref 19–32)
Calcium: 10 mg/dL (ref 8.4–10.5)
GFR calc Af Amer: >90 mL/min (ref >90)
Glucose, Bld: 97 mg/dL (ref 70–99)
Potassium: 3.8 mEq/L (ref 3.5–5.1)
Sodium: 135 mEq/L (ref 135–145)
Total Protein: 7.9 g/dL (ref 6.0–8.3)

## 2012-03-01 MED ORDER — IOHEXOL 300 MG/ML  SOLN
100.0000 mL | Freq: Once | INTRAMUSCULAR | Status: AC | PRN
  Administered 2012-03-01: 100 mL via INTRAVENOUS

## 2012-03-01 MED ORDER — ONDANSETRON HCL 4 MG/2ML IJ SOLN
4.0000 mg | Freq: Once | INTRAMUSCULAR | Status: AC
  Administered 2012-03-01: 4 mg via INTRAVENOUS
  Filled 2012-03-01: qty 2

## 2012-03-01 MED ORDER — SODIUM CHLORIDE 0.9 % IV SOLN
INTRAVENOUS | Status: DC
  Administered 2012-03-01: 23:00:00 via INTRAVENOUS

## 2012-03-01 MED ORDER — MORPHINE SULFATE 4 MG/ML IJ SOLN
4.0000 mg | Freq: Once | INTRAMUSCULAR | Status: AC
  Administered 2012-03-01: 4 mg via INTRAVENOUS
  Filled 2012-03-01: qty 1

## 2012-03-01 NOTE — ED Notes (Signed)
Per Pt started having LUQ abdominal pain starting today and worsening around 5pm.  Pt states no n/v.  No diarrhea.   No difficulty urinating or SOB.
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
## 2012-03-02 ENCOUNTER — Telehealth: Payer: Self-pay | Admitting: Internal Medicine

## 2012-03-02 MED ORDER — OXYCODONE-ACETAMINOPHEN 5-325 MG PO TABS: 1.0000 | ORAL_TABLET | Freq: Four times a day (QID) | ORAL | Status: DC | PRN

## 2012-03-02 MED ORDER — HYDROMORPHONE HCL PF 1 MG/ML IJ SOLN
1.0000 mg | Freq: Once | INTRAMUSCULAR | Status: AC
  Administered 2012-03-02: 1 mg via INTRAVENOUS
  Filled 2012-03-02: qty 1

## 2012-03-02 NOTE — ED Notes (Signed)
Patient given discharge instructions, information, prescriptions, and diet order. Patient states that they adequately understand discharge information given and to return to ED if symptoms return or worsen.     

## 2012-03-02 NOTE — Telephone Encounter (Signed)
Patient reports ER visit yesterday.  Normal CT, but elevated white count, LUQ pain.  She reports that she has a history of peptic ulcer and she feels this is the same pain.  She will come in and see Willette Cluster RNP on 03/05/12

## 2012-03-05 ENCOUNTER — Encounter: Payer: Self-pay | Admitting: Nurse Practitioner

## 2012-03-05 ENCOUNTER — Ambulatory Visit (INDEPENDENT_AMBULATORY_CARE_PROVIDER_SITE_OTHER): Payer: 59 | Admitting: Nurse Practitioner

## 2012-03-05 VITALS — BP 104/72 | HR 88 | Ht 62.5 in | Wt 181.5 lb

## 2012-03-05 DIAGNOSIS — R932 Abnormal findings on diagnostic imaging of liver and biliary tract: Secondary | ICD-10-CM

## 2012-03-05 DIAGNOSIS — R109 Unspecified abdominal pain: Secondary | ICD-10-CM

## 2012-03-05 DIAGNOSIS — K589 Irritable bowel syndrome without diarrhea: Secondary | ICD-10-CM | POA: Diagnosis not present

## 2012-03-05 DIAGNOSIS — R1010 Upper abdominal pain, unspecified: Secondary | ICD-10-CM

## 2012-03-05 MED ORDER — ESOMEPRAZOLE MAGNESIUM 40 MG PO CPDR: 40.0000 mg | DELAYED_RELEASE_CAPSULE | Freq: Every day | ORAL | Status: DC

## 2012-03-05 NOTE — Progress Notes (Signed)
03/05/2012 [name] [id number] 1971/01/21   History of Present Illness:  Patient is a 41 year old female known to Dr. Leone [name] for erosive / hemorrhagic gastritis, a history of peptic ulcer disease (2007) and constipation predominant IBS.  Patient gives a history of a bleeding ulcer in 2007. Follow up EGD in 2008 was normal except for an intact endoclip from her 2007 ulcer treatment. Weight stable now.  As far as IBS, patient was doing okay on Linzess at time of her last appointment here in August of this year.    [name] is here for a 3 week history of burning upper abdominal pain. Pain is nonradiating, it is worse with spicy foods. Pain reminiscent of gastric ulcer pain in 2007. No nausea or vomiting. No fevers. Patient hasn't taken nexium or any other PPI in 3-4 years. She takes NSAIDs about once a week. Patient was taken off PPIs when pregnant with twins. No GERD symptoms. She does belch a lot. Patient was evaluated in the emergency department for this pain on October 3. White count was elevated at 14.4. Hemoglobin was normal. LFTs unremarkable . Lipase normal. Pregnancy test negative. CT scan of the abdomen and pelvis revealed mild intrahepatic biliary ductal prominence, mild prominence of the pancreatic duct without stranding or lesion,bilateral nonobstructing renal stones, and some nonspecific left ischioanal air. See report below.    Current Medications, Allergies, Past Medical History, Past Surgical History, Family History and Social History were reviewed in [name] record.   Physical Exam: General: Pleasant, obese white female in no acute distress Head: Normocephalic and atraumatic Eyes:  sclerae anicteric, conjunctiva pink  Ears: Normal auditory acuity Lungs: Clear throughout to auscultation Heart: Regular rate and rhythm Abdomen: Soft, non tender and non distended. No masses, no hepatomegaly. Normal bowel sounds Musculoskeletal: Symmetrical with no  gross deformities  Extremities: No edema  Neurological: Alert oriented x 4, grossly nonfocal Psychological:  Alert and cooperative. Normal mood and affect   CT ABDOMEN AND PELVIS WITH CONTRAST  Technique: Multidetector CT imaging of the abdomen and pelvis was performed following the standard protocol during bolus  administration of intravenous contrast.  Contrast: OMNIPAQUE IOHEXOL 300 MG/ML SOLN  Comparison: 04/29/2011  Findings: The lung bases are clear. Heart size within normal  limits. Mild intrahepatic biliary ductal prominence. No extrahepatic biliary ductal dilatation. Normal appearance to the gallbladder. No radiodense gallstones. Unremarkable spleen and adrenal glands. The pancreas is mildly prominent as is the main pancreaticduct, measuring up to 3 mm. This is similar to prior. No solid or infiltrative pancreatic lesion identified.  There are numerous bilateral renal stones. Too small further  characterize hypodensity within the left kidney anteriorly on image 32. No hydronephrosis or hydroureter. No ureteral calculi  identified. No bowel obstruction. No CT evidence for colitis. Normal appendix. No free intraperitoneal air or fluid. No  lymphadenopathy. Normal caliber vasculature.  Partially decompressed bladder. Nonspecific appearance to the uterus/cervix. No adnexal mass. Nonspecific air within the left ischioanal fossa. No acute osseous finding. Left posterior subcutaneous battery pack with sacral neural stimulator roots entering on the left.  IMPRESSION:  Mild intrahepatic biliary ductal prominence without obstructing  lesion identified. Correlate with LFTs and ERCP if warranted.  Mild prominence of the pancreas without peripancreatic fat  stranding or lesion identified is similar to priors. Correlate  with lipase. Bilateral nonobstructing renal stones. No hydronephrosis or hydroureter. Nonspecific left ischioanal air. Can be seen with a fistulous communication or trauma to  the rectum or vagina. Correlate  with clinical history.  Original Report Authenticated By: Waneta Martins, M.D.   Assessment and Recommendations:  1. abdominal pain, burning and worse with spicy foods. Patient does have a history of gastric ulcers in 2007. Rule out gastritis, PUD, functional pain. She does take NSAIDs on a weekly basis. Patient has not been on a PPI for at least 3 years. Will begin daily PPI before breakfast. She will be scheduled for an upper endoscopy with propofol to be done by Dr. Leone [name]. The benefits, risks, and potential complications of EGD with possible biopsies were discussed with the patient and she agrees to proceed. If symptoms improve with PPI therapy then patient will call to discuss postponement of upper endoscopy. If no improvement and upper endoscopy is negative, may need workup for gallbladder disease (i.e. biliary dyskinesia).  2. IBS, constipation predominant. Doing relatively well on Linette appetite.  3. Abnormal CTscan with mild intrahepatic ductal dilation. No evidence for cholestasis by labs with total bilirubin on 0.4 and alk phos of 118. Doubt findings related to #1 though may need further imaging of bile ducts.

## 2012-03-05 NOTE — Patient Instructions (Addendum)
We sent a prescription for Nexium 40 mg to [facility] [address] . Take 1 capsule 30 min before breakfast.  We scheduled the Endoscopy with Dr. Stan Head on 03-09-2012. Directions provided.  Upper GI Endoscopy Upper GI endoscopy means using a flexible scope to look at the esophagus, stomach, and upper small bowel. This is done to make a diagnosis in people with heartburn, abdominal pain, or abnormal bleeding. Sometimes an endoscope is needed to remove foreign bodies or food that become stuck in the esophagus; it can also be used to take biopsy samples. For the best results, do not eat or drink for 8 hours before having your upper endoscopy.  To perform the endoscopy, you will probably be sedated and your throat will be numbed with a special spray. The endoscope is then slowly passed down your throat (this will not interfere with your breathing). An endoscopy exam takes 15 to 30 minutes to complete and there is no real pain. Patients rarely remember much about the procedure. The results of the test may take several days if a biopsy or other test is taken.  You may have a sore throat after an endoscopy exam. Serious complications are very rare. Stick to liquids and soft foods until your pain is better. Do not drive a car or operate any dangerous equipment for at least 24 hours after being sedated. SEEK IMMEDIATE MEDICAL CARE IF:   You have severe throat pain.   You have shortness of breath.   You have bleeding problems.   You have a fever.   You have difficulty recovering from your sedation.  Document Released: 06/23/2004 Document Revised: 05/05/2011 Document Reviewed: 05/18/2008 [facility] Patient Information 2012 [place], [place].

## 2012-03-06 DIAGNOSIS — F0630 Mood disorder due to known physiological condition, unspecified: Secondary | ICD-10-CM | POA: Diagnosis not present

## 2012-03-06 DIAGNOSIS — R1010 Upper abdominal pain, unspecified: Secondary | ICD-10-CM | POA: Insufficient documentation

## 2012-03-06 DIAGNOSIS — F429 Obsessive-compulsive disorder, unspecified: Secondary | ICD-10-CM | POA: Diagnosis not present

## 2012-03-06 NOTE — Progress Notes (Signed)
Agree with Ms. Guenther's assessment and plan. Carl E. Gessner, MD, FACG   

## 2012-03-09 ENCOUNTER — Encounter: Payer: Self-pay | Admitting: Internal Medicine

## 2012-03-09 ENCOUNTER — Ambulatory Visit (AMBULATORY_SURGERY_CENTER): Payer: 59 | Admitting: Internal Medicine

## 2012-03-09 VITALS — BP 131/81 | HR 69 | Temp 99.1°F | Resp 27 | Ht 62.5 in | Wt 181.0 lb

## 2012-03-09 DIAGNOSIS — K2990 Gastroduodenitis, unspecified, without bleeding: Secondary | ICD-10-CM | POA: Diagnosis not present

## 2012-03-09 DIAGNOSIS — K2970 Gastritis, unspecified, without bleeding: Secondary | ICD-10-CM | POA: Diagnosis not present

## 2012-03-09 DIAGNOSIS — R109 Unspecified abdominal pain: Secondary | ICD-10-CM | POA: Diagnosis not present

## 2012-03-09 MED ORDER — SODIUM CHLORIDE 0.9 % IV SOLN: 500.0000 mL | INTRAVENOUS | Status: DC

## 2012-03-09 NOTE — Patient Instructions (Addendum)
Mild gastritis was seen - stomach inflammation. This is common and not serious. No ulcers. Please stay on the Nexium and lets give it 2 months to see if that helps. If it does - you will most likely need to stay on it for a few months and then see if you can stop it.  Thank you for choosing me and [facility].  Iva Boop, MD, FACG  YOU HAD AN ENDOSCOPIC PROCEDURE TODAY AT THE [facility]: Refer to the procedure report that was given to you for any specific questions about what was found during the examination.  If the procedure report does not answer your questions, please call your gastroenterologist to clarify.  If you requested that your care partner not be given the details of your procedure findings, then the procedure report has been included in a sealed envelope for you to review at your convenience later.  YOU SHOULD EXPECT: Some feelings of bloating in the abdomen. Passage of more gas than usual.  Walking can help get rid of the air that was put into your GI tract during the procedure and reduce the bloating. If you had a lower endoscopy (such as a colonoscopy or flexible sigmoidoscopy) you may notice spotting of blood in your stool or on the toilet paper. If you underwent a bowel prep for your procedure, then you may not have a normal bowel movement for a few days.  DIET: Your first meal following the procedure should be a light meal and then it is ok to progress to your normal diet.  A half-sandwich or bowl of soup is an example of a good first meal.  Heavy or fried foods are harder to digest and may make you feel nauseous or bloated.  Likewise meals heavy in dairy and vegetables can cause extra gas to form and this can also increase the bloating.  Drink plenty of fluids but you should avoid alcoholic beverages for 24 hours.  ACTIVITY: Your care partner should take you home directly after the procedure.  You should plan to take it easy, moving slowly for the  rest of the day.  You can resume normal activity the day after the procedure however you should NOT DRIVE or use heavy machinery for 24 hours (because of the sedation medicines used during the test).    SYMPTOMS TO REPORT IMMEDIATELY: A gastroenterologist can be reached at any hour.  During normal business hours, 8:30 AM to 5:00 PM Monday through Friday, call [phone].  After hours and on weekends, please call the GI answering service at [phone] who will take a message and have the physician on call contact you.   Following upper endoscopy (EGD)  Vomiting of blood or coffee ground material  New chest pain or pain under the shoulder blades  Painful or persistently difficult swallowing  New shortness of breath  Fever of 100F or higher  Black, tarry-looking stools  FOLLOW UP: If any biopsies were taken you will be contacted by phone or by letter within the next 1-3 weeks.  Call your gastroenterologist if you have not heard about the biopsies in 3 weeks.  Our staff will call the home number listed on your records the next business day following your procedure to check on you and address any questions or concerns that you may have at that time regarding the information given to you following your procedure. This is a courtesy call and so if there is no answer at the home number  and we have not heard from you through the emergency physician on call, we will assume that you have returned to your regular daily activities without incident.  SIGNATURES/CONFIDENTIALITY: You and/or your care partner have signed paperwork which will be entered into your electronic medical record.  These signatures attest to the fact that that the information above on your After Visit Summary has been reviewed and is understood.  Full responsibility of the confidentiality of this discharge information lies with you and/or your care-partner.

## 2012-03-09 NOTE — Progress Notes (Signed)
Patient did not experience any of the following events: a burn prior to discharge; a fall within the facility; wrong site/side/patient/procedure/implant event; or a hospital transfer or hospital admission upon discharge from the facility. (G8907) Patient did not have preoperative order for IV antibiotic SSI prophylaxis. (G8918)  

## 2012-03-09 NOTE — Progress Notes (Signed)
The pt tolerated the egd very well. Maw   

## 2012-03-09 NOTE — Op Note (Signed)
[facility] [address] [place], [address]   ENDOSCOPY PROCEDURE REPORT  PATIENT: [name], [name]  MR#: [id number] BIRTHDATE: 1970/12/30 , 40  yrs. old GENDER: Female ENDOSCOPIST: Iva Boop, MD, [facility]  PROCEDURE DATE:  03/09/2012 PROCEDURE:  EGD, diagnostic ASA CLASS:     Class II INDICATIONS:  epigastric pain. MEDICATIONS: Propofol (Diprivan) 180 mg IV, MAC sedation, administered by CRNA, and These medications were titrated to patient response per physician's verbal order TOPICAL ANESTHETIC: Cetacaine Spray  DESCRIPTION OF PROCEDURE: After the risks benefits and alternatives of the procedure were thoroughly explained, informed consent was obtained.  The LB GIF-H180 [id number] endoscope was introduced through the mouth and advanced to the second portion of the duodenum. Without limitations.  The instrument was slowly withdrawn as the mucosa was fully examined.      STOMACH: Mild non-erosive gastritis (inflammation) was found in the gastric antrum.  ESOPHAGUS: The mucosa of the esophagus appeared normal.  DUODENUM: The duodenal mucosa showed no abnormalities in the bulb and second portion of the duodenum.  Retroflexed views revealed no abnormalities.     The scope was then withdrawn from the patient and the procedure completed.  COMPLICATIONS: There were no complications. ENDOSCOPIC IMPRESSION: 1.   Non-erosive gastritis (inflammation) was found in the gastric antrum 2.   The mucosa of the esophagus appeared normal 3.   The duodenal mucosa showed no abnormalities in the bulb and second portion of the duodenum  RECOMMENDATIONS: continue PPI - Nexium   eSigned:  Iva Boop, MD, [facility] 03/09/2012 10:42 AM   MW:NUUVO Little, MD and The Patient

## 2012-03-12 ENCOUNTER — Telehealth: Payer: Self-pay | Admitting: *Deleted

## 2012-03-12 NOTE — Telephone Encounter (Signed)
  Follow up Call-  Call back number 03/09/2012  Post procedure Call Back phone  # [phone]  Permission to leave phone message Yes     Patient questions:  Message left to call if necessary.

## 2012-04-02 ENCOUNTER — Telehealth: Payer: Self-pay | Admitting: Nurse Practitioner

## 2012-04-02 ENCOUNTER — Other Ambulatory Visit: Payer: Self-pay | Admitting: *Deleted

## 2012-04-02 MED ORDER — ESOMEPRAZOLE MAGNESIUM 40 MG PO CPDR: 40.0000 mg | DELAYED_RELEASE_CAPSULE | Freq: Every day | ORAL | Status: DC

## 2012-04-02 NOTE — Telephone Encounter (Signed)
Faxing Nexium RX to [address]

## 2012-04-04 DIAGNOSIS — R300 Dysuria: Secondary | ICD-10-CM | POA: Diagnosis not present

## 2012-04-04 DIAGNOSIS — N3010 Interstitial cystitis (chronic) without hematuria: Secondary | ICD-10-CM | POA: Diagnosis not present

## 2012-04-04 DIAGNOSIS — F329 Major depressive disorder, single episode, unspecified: Secondary | ICD-10-CM | POA: Diagnosis not present

## 2012-04-04 DIAGNOSIS — G47419 Narcolepsy without cataplexy: Secondary | ICD-10-CM | POA: Diagnosis not present

## 2012-04-04 DIAGNOSIS — N9489 Other specified conditions associated with female genital organs and menstrual cycle: Secondary | ICD-10-CM | POA: Diagnosis not present

## 2012-04-04 DIAGNOSIS — N949 Unspecified condition associated with female genital organs and menstrual cycle: Secondary | ICD-10-CM | POA: Diagnosis not present

## 2012-04-04 DIAGNOSIS — R109 Unspecified abdominal pain: Secondary | ICD-10-CM | POA: Diagnosis not present

## 2012-04-04 DIAGNOSIS — F3289 Other specified depressive episodes: Secondary | ICD-10-CM | POA: Diagnosis not present

## 2012-04-04 DIAGNOSIS — IMO0001 Reserved for inherently not codable concepts without codable children: Secondary | ICD-10-CM | POA: Diagnosis not present

## 2012-04-18 ENCOUNTER — Telehealth: Payer: Self-pay | Admitting: *Deleted

## 2012-04-18 NOTE — Telephone Encounter (Signed)
Called pt and left a message that I tried to do a prior auth with [facility] and they did not approve it.  She does not have enough wrong for them to approve it.  I did advise this on the message and let her know I put samples at the front desk for her.  I also let her know Dr Leone Payor noted after the Endoscopy on 03-09-2012 that she could stay on it for about 2 months and then see if she can stop it .  I asked her to call me back to let me know she got my message about the samples.

## 2012-05-20 DIAGNOSIS — E860 Dehydration: Secondary | ICD-10-CM | POA: Diagnosis not present

## 2012-05-20 DIAGNOSIS — R112 Nausea with vomiting, unspecified: Secondary | ICD-10-CM | POA: Diagnosis not present

## 2012-05-20 DIAGNOSIS — J111 Influenza due to unidentified influenza virus with other respiratory manifestations: Secondary | ICD-10-CM | POA: Diagnosis not present

## 2012-05-25 DIAGNOSIS — R05 Cough: Secondary | ICD-10-CM | POA: Diagnosis not present

## 2012-05-25 DIAGNOSIS — R059 Cough, unspecified: Secondary | ICD-10-CM | POA: Diagnosis not present

## 2012-06-05 DIAGNOSIS — R1084 Generalized abdominal pain: Secondary | ICD-10-CM | POA: Diagnosis not present

## 2012-06-05 DIAGNOSIS — N949 Unspecified condition associated with female genital organs and menstrual cycle: Secondary | ICD-10-CM | POA: Diagnosis not present

## 2012-06-06 DIAGNOSIS — N3941 Urge incontinence: Secondary | ICD-10-CM | POA: Diagnosis not present

## 2012-06-06 DIAGNOSIS — R3911 Hesitancy of micturition: Secondary | ICD-10-CM | POA: Diagnosis not present

## 2012-06-06 DIAGNOSIS — F329 Major depressive disorder, single episode, unspecified: Secondary | ICD-10-CM | POA: Diagnosis not present

## 2012-06-06 DIAGNOSIS — N3010 Interstitial cystitis (chronic) without hematuria: Secondary | ICD-10-CM | POA: Diagnosis not present

## 2012-06-06 DIAGNOSIS — R3915 Urgency of urination: Secondary | ICD-10-CM | POA: Diagnosis not present

## 2012-06-06 DIAGNOSIS — R300 Dysuria: Secondary | ICD-10-CM | POA: Diagnosis not present

## 2012-06-06 DIAGNOSIS — IMO0001 Reserved for inherently not codable concepts without codable children: Secondary | ICD-10-CM | POA: Diagnosis not present

## 2012-06-06 DIAGNOSIS — R350 Frequency of micturition: Secondary | ICD-10-CM | POA: Diagnosis not present

## 2012-06-06 DIAGNOSIS — F3289 Other specified depressive episodes: Secondary | ICD-10-CM | POA: Diagnosis not present

## 2012-06-12 DIAGNOSIS — F429 Obsessive-compulsive disorder, unspecified: Secondary | ICD-10-CM | POA: Diagnosis not present

## 2012-06-12 DIAGNOSIS — F0630 Mood disorder due to known physiological condition, unspecified: Secondary | ICD-10-CM | POA: Diagnosis not present

## 2012-06-15 ENCOUNTER — Telehealth: Payer: Self-pay | Admitting: Internal Medicine

## 2012-06-15 NOTE — Telephone Encounter (Signed)
[name] for patient to call back so I may inform her what Dr. Leone Payor said about her and her husband Kenneth's medicines.

## 2012-06-15 NOTE — Telephone Encounter (Signed)
Try omeprazole 40 mg daily before breakfast #30 11 refills or 90 day also ok  Please give samples of Linzess to her  See if we can find out who insurance co is and get a hold of the formulary  Are there Linzess discount cards??

## 2012-06-15 NOTE — Telephone Encounter (Signed)
Or do we have any samples of Linzess

## 2012-06-15 NOTE — Telephone Encounter (Signed)
Spoke to patient and she said the Linzess would be $300.00 for 90 days worth.  Insurance company would not tell her what they would cover, for her to ask her Dr. For alternative .  She also needs alternative for Nexium, they told her no name brand products will be covered.  Please advise Sir.  Thank you.

## 2012-06-18 ENCOUNTER — Telehealth: Payer: Self-pay | Admitting: Cardiovascular Disease

## 2012-06-18 ENCOUNTER — Telehealth: Payer: Self-pay

## 2012-06-18 DIAGNOSIS — I10 Essential (primary) hypertension: Secondary | ICD-10-CM

## 2012-06-18 MED ORDER — LINACLOTIDE 290 MCG PO CAPS: 290.0000 ug | ORAL_CAPSULE | Freq: Every day | ORAL | Status: DC

## 2012-06-18 MED ORDER — OMEPRAZOLE 40 MG PO CPDR: DELAYED_RELEASE_CAPSULE | ORAL | Status: DC

## 2012-06-18 NOTE — Telephone Encounter (Signed)
Pt needs 3 month supply of lisinopril and cartia refilled at [facility] [address]--new pharmacy pls change

## 2012-06-18 NOTE — Telephone Encounter (Signed)
Spoke to patient and confirmed pharmacy and amount insurance request at a time, 90 days worth per patient.  She is going to contact her insurance company requesting a formulary so we may find replacement for the Linzess.  In the meantime samples put up front of Linzess and omeprazole sent in to [facility].

## 2012-06-19 MED ORDER — LISINOPRIL 10 MG PO TABS: 10.0000 mg | ORAL_TABLET | Freq: Every day | ORAL | Status: DC

## 2012-06-19 MED ORDER — DILTIAZEM HCL ER COATED BEADS 240 MG PO CP24: 240.0000 mg | ORAL_CAPSULE | Freq: Every day | ORAL | Status: DC

## 2012-06-19 NOTE — Telephone Encounter (Signed)
No refills until appointment. Fax Received. Refill Completed. [name] (R.M.A)

## 2012-06-20 ENCOUNTER — Other Ambulatory Visit: Payer: Self-pay | Admitting: *Deleted

## 2012-06-20 DIAGNOSIS — IMO0001 Reserved for inherently not codable concepts without codable children: Secondary | ICD-10-CM | POA: Diagnosis not present

## 2012-06-20 DIAGNOSIS — I10 Essential (primary) hypertension: Secondary | ICD-10-CM

## 2012-06-20 MED ORDER — DILTIAZEM HCL ER COATED BEADS 240 MG PO CP24: 240.0000 mg | ORAL_CAPSULE | Freq: Every day | ORAL | Status: DC

## 2012-06-20 MED ORDER — LISINOPRIL 10 MG PO TABS: 10.0000 mg | ORAL_TABLET | Freq: Every day | ORAL | Status: DC

## 2012-06-20 NOTE — Telephone Encounter (Signed)
Fax Received. Refill Completed. Lila Lufkin Chowoe (R.M.A)   

## 2012-08-21 ENCOUNTER — Encounter: Payer: Self-pay | Admitting: Cardiovascular Disease

## 2012-08-24 ENCOUNTER — Ambulatory Visit (INDEPENDENT_AMBULATORY_CARE_PROVIDER_SITE_OTHER): Payer: 59 | Admitting: Cardiovascular Disease

## 2012-08-24 ENCOUNTER — Encounter: Payer: Self-pay | Admitting: Cardiovascular Disease

## 2012-08-24 VITALS — BP 106/64 | HR 81 | Ht 62.5 in | Wt 179.0 lb

## 2012-08-24 DIAGNOSIS — I10 Essential (primary) hypertension: Secondary | ICD-10-CM | POA: Diagnosis not present

## 2012-08-24 MED ORDER — LISINOPRIL 10 MG PO TABS: 10.0000 mg | ORAL_TABLET | Freq: Every day | ORAL | Status: DC

## 2012-08-24 MED ORDER — DILTIAZEM HCL ER COATED BEADS 240 MG PO CP24: 240.0000 mg | ORAL_CAPSULE | Freq: Every day | ORAL | Status: DC

## 2012-08-24 NOTE — Patient Instructions (Addendum)
  Your physician wants you to follow-up in: 1 YEAR  You will receive a reminder letter in the mail two months in advance. If you don't receive a letter, please call our office to schedule the follow-up appointment.  Your physician recommends that you return for lab work in: TODAY BMET  Your physician recommends that you return for a FASTING lipid profile: 1 YEAR     [facility] Low Glycemic Diet (Source: [facility], 2006) Low Glycemic Foods (20-49) (Decrease risk of developing heart disease) Breakfast Cereals: All-Bran All-Bran Fruit 'n Oats Fiber One Oatmeal (not instant) Oat bran Fruits and fruit juices: (Limit to 1-2 servings per day) Apples Apricots (fresh & dried) Blackberries Blueberries Cherries Cranberries Peaches Pears Plums Prunes Grapefruit Raspberries Strawberries Tangerine Apple juice Grapefruit juice Tomato juice Beans and legumes (fresh-cooked): Black-eyed peas Butter beans Chick peas Lentils  Green beans Lima beans Kidney beans Navy beans Pinto beans Snow peas Non-starchy vegetables: Asparagus, avocado, broccoli, cabbage, cauliflower, celery, cucumber, greens, lettuce, mushrooms, peppers, tomatoes, okra, onions, spinach, summer squash Grains: Barley Bulgur Rye Wild rice Nuts and oils : Almonds Peanuts Sunflower seeds Hazelnuts Pecans Walnuts Oils that are liquid at room temperature Dairy, fish, meat, soy, and eggs: Milk, skim Lowfat cheese Yogurt, lowfat, fruit sugar sweetened Lean red meat Fish  Skinless chicken & [place] Shellfish Egg whites (up to 3 daily) Soy products  Egg yolks (up to 7 or _____ per week) Moderate Glycemic Foods (50-69) Breakfast Cereals: Bran Buds Bran Chex Just Right Mini-Wheats  Special K Swiss muesli Fruits: Banana (under-ripe) Dates Figs Grapes Kiwi Mango Oranges Raisins Fruit Juices: Cranberry juice Orange juice Beans and legumes: Boston-type baked beans Canned pinto, kidney, or navy  beans Green peas Vegetables: Beets Carrots  Sweet potato Yam Corn on the cob Breads: Pita (pocket) bread Oat bran bread Pumpernickel bread Rye bread Wheat bread, high fiber  Grains: Cornmeal Rice, brown Rice, white Couscous Pasta: Macaroni Pizza, cheese Ravioli, meat filled Spaghetti, white  Nuts: Cashews Macadamia Snacks: Chocolate Ice cream, lowfat Muffin Popcorn High Glycemic Foods (70-100)  Breakfast Cereals: Cheerios Corn Chex Corn Flakes Cream of Wheat Grape Nuts Grape Nut Flakes Grits Nutri-Grain Puffed Rice Puffed Wheat Rice Chex Rice Krispies Shredded Wheat Team Total Fruits: Pineapple Watermelon Banana (over-ripe) Beverages: Sodas, sweet tea, pineapple juice Vegetables: Potato, baked, boiled, fried, mashed [place] fries Canned or frozen corn Parsnips Winter squash Breads: Most breads (white and whole grain) Bagels Bread sticks Bread stuffing Kaiser roll Dinner rolls Grains: Rice, instant Tapioca, with milk Candy and most cookies Snacks: Donuts Corn chips Jelly beans Pretzels Pastries

## 2012-08-24 NOTE — Assessment & Plan Note (Signed)
[name] seems to be doing well. Her blood pressure is well-controlled. We'll continue with her same medications.  She occasionally complains of some leg cramps. She's had low potassium levels in the future. We'll check a basic metabolic profile today. We may need intolerance potassium tablets if her potassium remains low.
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
## 2012-08-29 DIAGNOSIS — K5900 Constipation, unspecified: Secondary | ICD-10-CM | POA: Insufficient documentation

## 2012-08-29 DIAGNOSIS — K581 Irritable bowel syndrome with constipation: Secondary | ICD-10-CM | POA: Insufficient documentation

## 2012-09-12 DIAGNOSIS — F3289 Other specified depressive episodes: Secondary | ICD-10-CM | POA: Diagnosis not present

## 2012-09-12 DIAGNOSIS — N3010 Interstitial cystitis (chronic) without hematuria: Secondary | ICD-10-CM | POA: Diagnosis not present

## 2012-09-12 DIAGNOSIS — R3911 Hesitancy of micturition: Secondary | ICD-10-CM | POA: Diagnosis not present

## 2012-09-12 DIAGNOSIS — R350 Frequency of micturition: Secondary | ICD-10-CM | POA: Diagnosis not present

## 2012-09-12 DIAGNOSIS — F329 Major depressive disorder, single episode, unspecified: Secondary | ICD-10-CM | POA: Diagnosis not present

## 2012-09-12 DIAGNOSIS — R3915 Urgency of urination: Secondary | ICD-10-CM | POA: Diagnosis not present

## 2012-09-12 DIAGNOSIS — IMO0001 Reserved for inherently not codable concepts without codable children: Secondary | ICD-10-CM | POA: Diagnosis not present

## 2012-09-12 DIAGNOSIS — R300 Dysuria: Secondary | ICD-10-CM | POA: Diagnosis not present

## 2012-09-12 DIAGNOSIS — N3941 Urge incontinence: Secondary | ICD-10-CM | POA: Diagnosis not present

## 2012-09-24 DIAGNOSIS — Z79899 Other long term (current) drug therapy: Secondary | ICD-10-CM | POA: Diagnosis not present

## 2012-10-24 DIAGNOSIS — IMO0001 Reserved for inherently not codable concepts without codable children: Secondary | ICD-10-CM | POA: Diagnosis not present

## 2012-11-08 DIAGNOSIS — Z01419 Encounter for gynecological examination (general) (routine) without abnormal findings: Secondary | ICD-10-CM | POA: Diagnosis not present

## 2012-11-08 DIAGNOSIS — N643 Galactorrhea not associated with childbirth: Secondary | ICD-10-CM | POA: Diagnosis not present

## 2012-11-08 DIAGNOSIS — Z30430 Encounter for insertion of intrauterine contraceptive device: Secondary | ICD-10-CM | POA: Diagnosis not present

## 2012-11-24 ENCOUNTER — Encounter (HOSPITAL_COMMUNITY): Payer: Self-pay

## 2012-11-24 ENCOUNTER — Inpatient Hospital Stay (HOSPITAL_COMMUNITY)
Admission: AD | Admit: 2012-11-24 | Discharge: 2012-11-25 | Disposition: A | Discharge status: Home / Self Care | Payer: 59 | Source: Ambulatory Visit | Attending: Obstetrics and Gynecology | Admitting: Obstetrics and Gynecology

## 2012-11-24 DIAGNOSIS — Y849 Medical procedure, unspecified as the cause of abnormal reaction of the patient, or of later complication, without mention of misadventure at the time of the procedure: Secondary | ICD-10-CM | POA: Insufficient documentation

## 2012-11-24 DIAGNOSIS — M549 Dorsalgia, unspecified: Secondary | ICD-10-CM | POA: Diagnosis not present

## 2012-11-24 DIAGNOSIS — R109 Unspecified abdominal pain: Secondary | ICD-10-CM | POA: Insufficient documentation

## 2012-11-24 DIAGNOSIS — T8339XA Other mechanical complication of intrauterine contraceptive device, initial encounter: Secondary | ICD-10-CM | POA: Insufficient documentation

## 2012-11-24 HISTORY — DX: Adverse effect of unspecified anesthetic, initial encounter: T41.45XA

## 2012-11-24 HISTORY — DX: Unspecified convulsions: R56.9

## 2012-11-24 LAB — URINALYSIS, ROUTINE W REFLEX MICROSCOPIC
Glucose, UA: NEGATIVE mg/dL
Hgb urine dipstick: NEGATIVE
Leukocytes, UA: NEGATIVE
Protein, ur: NEGATIVE mg/dL
Specific Gravity, Urine: >1.03 — ABNORMAL HIGH (ref 1.005–1.030)
pH: 6 (ref 5.0–8.0)

## 2012-11-24 NOTE — MAU Note (Signed)
Mirena placed 11/08/12 by Dr. Billy Coast. Abdominal pain since then but abdominal cramping and back pain worse tonight. Denies vaginal bleeding. Denies vaginal discharge. Denies fever at home.

## 2012-11-25 ENCOUNTER — Inpatient Hospital Stay (HOSPITAL_COMMUNITY): Payer: 59

## 2012-11-25 DIAGNOSIS — N83209 Unspecified ovarian cyst, unspecified side: Secondary | ICD-10-CM | POA: Diagnosis not present

## 2012-11-25 DIAGNOSIS — R109 Unspecified abdominal pain: Secondary | ICD-10-CM | POA: Diagnosis not present

## 2012-11-25 DIAGNOSIS — Z30432 Encounter for removal of intrauterine contraceptive device: Secondary | ICD-10-CM | POA: Diagnosis not present

## 2012-11-25 LAB — CBC WITH DIFFERENTIAL/PLATELET
Basophils Absolute: 0 10*3/uL (ref 0.0–0.1)
Eosinophils Relative: 0 % (ref 0–5)
HCT: 37 % (ref 36.0–46.0)
Hemoglobin: 12.3 g/dL (ref 12.0–15.0)
Lymphocytes Relative: 26 % (ref 12–46)
Lymphs Abs: 3.3 10*3/uL (ref 0.7–4.0)
MCV: 86.2 fL (ref 78.0–100.0)
Monocytes Absolute: 1.1 10*3/uL — ABNORMAL HIGH (ref 0.1–1.0)
Monocytes Relative: 8 % (ref 3–12)
Neutro Abs: 8.3 10*3/uL — ABNORMAL HIGH (ref 1.7–7.7)
RBC: 4.29 MIL/uL (ref 3.87–5.11)
RDW: 13.4 % (ref 11.5–15.5)
WBC: 12.7 10*3/uL — ABNORMAL HIGH (ref 4.0–10.5)

## 2012-11-25 MED ORDER — METRONIDAZOLE 500 MG PO TABS: 500.0000 mg | ORAL_TABLET | Freq: Two times a day (BID) | ORAL | Status: DC

## 2012-11-25 MED ORDER — AZITHROMYCIN 250 MG PO TABS: 250.0000 mg | ORAL_TABLET | Freq: Once | ORAL | Status: DC
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
## 2012-12-19 DIAGNOSIS — N3010 Interstitial cystitis (chronic) without hematuria: Secondary | ICD-10-CM | POA: Diagnosis not present

## 2012-12-19 DIAGNOSIS — N9489 Other specified conditions associated with female genital organs and menstrual cycle: Secondary | ICD-10-CM | POA: Diagnosis not present

## 2012-12-19 DIAGNOSIS — IMO0001 Reserved for inherently not codable concepts without codable children: Secondary | ICD-10-CM | POA: Diagnosis not present

## 2012-12-19 DIAGNOSIS — N949 Unspecified condition associated with female genital organs and menstrual cycle: Secondary | ICD-10-CM | POA: Diagnosis not present

## 2012-12-24 DIAGNOSIS — L909 Atrophic disorder of skin, unspecified: Secondary | ICD-10-CM | POA: Diagnosis not present

## 2012-12-24 DIAGNOSIS — D2330 Other benign neoplasm of skin of unspecified part of face: Secondary | ICD-10-CM | POA: Diagnosis not present

## 2012-12-24 DIAGNOSIS — L919 Hypertrophic disorder of the skin, unspecified: Secondary | ICD-10-CM | POA: Diagnosis not present

## 2012-12-24 DIAGNOSIS — D235 Other benign neoplasm of skin of trunk: Secondary | ICD-10-CM | POA: Diagnosis not present

## 2013-01-16 ENCOUNTER — Telehealth: Payer: Self-pay | Admitting: Internal Medicine

## 2013-01-16 DIAGNOSIS — K589 Irritable bowel syndrome without diarrhea: Secondary | ICD-10-CM | POA: Diagnosis not present

## 2013-01-16 DIAGNOSIS — N3010 Interstitial cystitis (chronic) without hematuria: Secondary | ICD-10-CM | POA: Diagnosis not present

## 2013-01-16 DIAGNOSIS — N9489 Other specified conditions associated with female genital organs and menstrual cycle: Secondary | ICD-10-CM | POA: Diagnosis not present

## 2013-01-16 DIAGNOSIS — N949 Unspecified condition associated with female genital organs and menstrual cycle: Secondary | ICD-10-CM | POA: Diagnosis not present

## 2013-01-16 DIAGNOSIS — IMO0001 Reserved for inherently not codable concepts without codable children: Secondary | ICD-10-CM | POA: Diagnosis not present

## 2013-01-16 DIAGNOSIS — N3941 Urge incontinence: Secondary | ICD-10-CM | POA: Diagnosis not present

## 2013-01-16 NOTE — Telephone Encounter (Signed)
Patient has hemorrhoids and wants to know if they can be treated in the office.  She is advised that if she is a candidate for hemorrhoid ligation it can be taken care of at the office visit.  He will need to assess her to determine.  She verbalizes understanding.

## 2013-01-18 ENCOUNTER — Encounter: Payer: Self-pay | Admitting: Internal Medicine

## 2013-01-18 ENCOUNTER — Ambulatory Visit (INDEPENDENT_AMBULATORY_CARE_PROVIDER_SITE_OTHER): Payer: 59 | Admitting: Internal Medicine

## 2013-01-18 VITALS — BP 100/68 | HR 84 | Ht 62.5 in | Wt 175.6 lb

## 2013-01-18 DIAGNOSIS — K589 Irritable bowel syndrome without diarrhea: Secondary | ICD-10-CM

## 2013-01-18 DIAGNOSIS — N8184 Pelvic muscle wasting: Secondary | ICD-10-CM | POA: Diagnosis not present

## 2013-01-18 DIAGNOSIS — K648 Other hemorrhoids: Secondary | ICD-10-CM | POA: Diagnosis not present

## 2013-01-18 DIAGNOSIS — K5900 Constipation, unspecified: Secondary | ICD-10-CM | POA: Diagnosis not present

## 2013-01-18 DIAGNOSIS — K5909 Other constipation: Secondary | ICD-10-CM

## 2013-01-18 DIAGNOSIS — M6289 Other specified disorders of muscle: Secondary | ICD-10-CM

## 2013-01-18 HISTORY — DX: Other hemorrhoids: K64.8

## 2013-01-18 MED ORDER — LINACLOTIDE 145 MCG PO CAPS: 290.0000 ug | ORAL_CAPSULE | Freq: Every day | ORAL | Status: DC

## 2013-01-18 MED ORDER — LINACLOTIDE 290 MCG PO CAPS: 290.0000 ug | ORAL_CAPSULE | Freq: Every day | ORAL | Status: DC

## 2013-01-18 NOTE — Assessment & Plan Note (Signed)
RP column ligated today. To return in Sept for reassessment and repeat ligation - likely LL column
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
## 2013-01-18 NOTE — Patient Instructions (Addendum)
Today you have been given samples of Linzess . , take 2 each morning.  Also we are giving you a written rx for the Linzess .  Along with a coupon.  HEMORRHOID BANDING PROCEDURE    FOLLOW-UP CARE   1. The procedure you have had should have been relatively painless since the banding of the area involved does not have nerve endings and there is no pain sensation.  The rubber band cuts off the blood supply to the hemorrhoid and the band may fall off as soon as 48 hours after the banding (the band may occasionally be seen in the toilet bowl following a bowel movement). You may notice a temporary feeling of fullness in the rectum which should respond adequately to plain Tylenol or Motrin.  2. Following the banding, avoid strenuous exercise that evening and resume full activity the next day.  A sitz bath (soaking in a warm tub) or bidet is soothing, and can be useful for cleansing the area after bowel movements.     3. To avoid constipation, take two tablespoons of natural wheat bran, natural oat bran, flax, Benefiber or any over the counter fiber supplement and increase your water intake to 7-8 glasses daily.    4. Unless you have been prescribed anorectal medication, do not put anything inside your rectum for two weeks: No suppositories, enemas, fingers, etc.  5. Occasionally, you may have more bleeding than usual after the banding procedure.  This is often from the untreated hemorrhoids rather than the treated one.  Don't be concerned if there is a tablespoon or so of blood.  If there is more blood than this, lie flat with your bottom higher than your head and apply an ice pack to the area. If the bleeding does not stop within a half an hour or if you feel faint, call our office at [phone] or go to the emergency room.  6. Problems are not common; however, if there is a substantial amount of bleeding, severe pain, chills, fever or difficulty passing urine (very rare) or other  problems, you should call us at [phone] or report to the nearest emergency room.  7. Do not stay seated continuously for more than 2-3 hours for a day or two after the procedure.  Tighten your buttock muscles 10-15 times every two hours and take 10-15 deep breaths every 1-2 hours.  Do not spend more than a few minutes on the toilet if you cannot empty your bowel; instead re-visit the toilet at a later time.    I appreciate the opportunity to care for you.   We can do your second banding mid-September after your beach trip.  Appointment made for 02/22/13 at 9am.

## 2013-01-18 NOTE — Assessment & Plan Note (Signed)
Will resume Linzess 20ug daily - samples and a discount card should help.

## 2013-01-18 NOTE — Assessment & Plan Note (Signed)
Resume Linzess

## 2013-02-12 DIAGNOSIS — M533 Sacrococcygeal disorders, not elsewhere classified: Secondary | ICD-10-CM | POA: Diagnosis not present

## 2013-02-12 DIAGNOSIS — M545 Low back pain, unspecified: Secondary | ICD-10-CM | POA: Diagnosis not present

## 2013-02-12 DIAGNOSIS — IMO0001 Reserved for inherently not codable concepts without codable children: Secondary | ICD-10-CM | POA: Diagnosis not present

## 2013-02-12 DIAGNOSIS — M47817 Spondylosis without myelopathy or radiculopathy, lumbosacral region: Secondary | ICD-10-CM | POA: Diagnosis not present

## 2013-02-12 DIAGNOSIS — G894 Chronic pain syndrome: Secondary | ICD-10-CM | POA: Diagnosis not present

## 2013-02-22 ENCOUNTER — Encounter: Payer: 59 | Admitting: Internal Medicine

## 2013-02-22 DIAGNOSIS — Z466 Encounter for fitting and adjustment of urinary device: Secondary | ICD-10-CM | POA: Diagnosis not present

## 2013-02-22 DIAGNOSIS — N3941 Urge incontinence: Secondary | ICD-10-CM | POA: Diagnosis not present

## 2013-02-22 DIAGNOSIS — N3010 Interstitial cystitis (chronic) without hematuria: Secondary | ICD-10-CM | POA: Diagnosis not present

## 2013-02-22 DIAGNOSIS — R3915 Urgency of urination: Secondary | ICD-10-CM | POA: Diagnosis not present

## 2013-02-22 DIAGNOSIS — R350 Frequency of micturition: Secondary | ICD-10-CM | POA: Diagnosis not present

## 2013-04-18 DIAGNOSIS — M47817 Spondylosis without myelopathy or radiculopathy, lumbosacral region: Secondary | ICD-10-CM | POA: Diagnosis not present

## 2013-06-06 DIAGNOSIS — R1031 Right lower quadrant pain: Secondary | ICD-10-CM | POA: Diagnosis not present

## 2013-06-11 DIAGNOSIS — M47817 Spondylosis without myelopathy or radiculopathy, lumbosacral region: Secondary | ICD-10-CM | POA: Diagnosis not present

## 2013-06-17 ENCOUNTER — Telehealth: Payer: Self-pay | Admitting: Internal Medicine

## 2013-06-17 NOTE — Telephone Encounter (Signed)
Patient with large external hemorrhoid causing pain.  She is advised to use sitz bath and lidocaine ointment until her office visit with Dr. Carlean Purl tomorrow at 2:15

## 2013-06-18 ENCOUNTER — Ambulatory Visit (INDEPENDENT_AMBULATORY_CARE_PROVIDER_SITE_OTHER): Payer: 59 | Admitting: Internal Medicine

## 2013-06-18 ENCOUNTER — Encounter: Payer: Self-pay | Admitting: Internal Medicine

## 2013-06-18 VITALS — BP 108/62 | HR 92 | Ht 61.5 in | Wt 179.0 lb

## 2013-06-18 DIAGNOSIS — K645 Perianal venous thrombosis: Secondary | ICD-10-CM

## 2013-06-18 HISTORY — DX: Perianal venous thrombosis: K64.5

## 2013-06-18 NOTE — Assessment & Plan Note (Addendum)
Right anterior internal hemorrhoid suspected. She is quite symptomatic. She will continue topical therapies and sitz baths. I will obtain an appointment for her to see surgery tomorrow at 3 PM. I've explained that sometimes it can be too late for clot excision with a thrombosed hemorrhoid but that she would have to wait for the surgeon to evaluate her. She wishes to proceed.

## 2013-06-18 NOTE — Patient Instructions (Signed)
You have been scheduled for an appointment with Dr. Stark Klein at [facility]. Your appointment is on 06/19/13 at 3:00pm. Please arrive at 2:30pm for registration. Make certain to bring a list of current medications, including any over the counter medications or vitamins. Also bring your co-pay if you have one as well as your insurance cards. [facility] is located at [address], [address]. Should you need to reschedule your appointment, please contact them at [phone].  I appreciate the opportunity to care for you.

## 2013-06-18 NOTE — Progress Notes (Signed)
         Subjective:    Patient ID: Olin Pia, female    DOB: March 20, 1971, 43 y.o.   MRN: [id number]  HPI Ercelle noticed painful anal swelling in last couple of days. Worse today. She has a history of IBS, chronic constipation in the setting of narcotics for interstitial cystitis pain. She has known hemorrhoids and had banding of the right posterior internal hemorrhoid complex in August. She notes that improved her prolapsing symptoms. She says that her interstitial cystitis symptoms have been flaring and she's been straining more urinary and maybe with defecation as well. She has been soaking in adult using topical lidocaine for what she thinks is a painful hemorrhoid with minimal relief at this point. Medications, allergies, past medical history, past surgical history, family history and social history are reviewed and updated in the EMR.   Review of Systems As above    Objective:   Physical Exam No acute distress Rectal exam with female chaperone present shows a moderately large thrombosed hemorrhoid in the right anterior position, it is swollen and tender, I think this is a right anterior internal hemorrhoid. There is a small tag in the left lateral position, question sentinel pile.    Assessment & Plan:   1. Internal thrombosed hemorrhoid - right anterior suspected

## 2013-06-19 ENCOUNTER — Encounter (INDEPENDENT_AMBULATORY_CARE_PROVIDER_SITE_OTHER): Payer: Self-pay | Admitting: General Surgery

## 2013-06-19 ENCOUNTER — Ambulatory Visit (INDEPENDENT_AMBULATORY_CARE_PROVIDER_SITE_OTHER): Payer: 59 | Admitting: General Surgery

## 2013-06-19 VITALS — BP 110/82 | HR 90 | Temp 97.0°F | Resp 18 | Ht 61.0 in | Wt 178.0 lb

## 2013-06-19 DIAGNOSIS — K645 Perianal venous thrombosis: Secondary | ICD-10-CM | POA: Diagnosis not present

## 2013-06-19 NOTE — Patient Instructions (Signed)
Home Instructions Following Incision and Drainage of Thrombosed hemorrhoid  Wound care - A dressing has been applied to control any bleeding or drainage immediately after your procedure.  You may remove this dressing at your first bowel movement or tomorrow morning, whichever comes first.  After the dressing is removed, clean the area gently with a mild soap and warm water and place a piece of 100% cotton over the area.  Change to cotton ever 1-3 hours while awake to keep the area clean and dry.   - Beginning tomorrow, sit in a tub of warm water for 15-20 minutes at least twice a day and after bowel movements.  This will help with healing, pain and discomfort. - A small amount of bleeding is to be expected.  If you notice an increase in the bleeding, place a large piece of cotton (about the size of a golf ball) next to the anal opening and sit on a hard surface for 15 minutes.  If the bleeding persists or if you are concerned, please call the office.  Do not sit on rubber rings.  Instead, sit on a soft pillow.    Diet -Eat a regular diet.  Avoid foods that may constipate you or give you diarrhea.  Drink 6-8 glasses of water a day and avoid seeds, nuts and popcorn until the area heals.  Medication -Take pain medication as directed.  Do not drive or operate machinery if you are taking a prescription pain medication.   - We recommend Extra Strength Tylenol for mild to moderate pain.  This can be taken as instructed on the bottle.   - If you are given a prescription for antibiotics, take as instructed by your doctor until the entire course is completed  Bowel Habits Avoid laxatives unless instructed by your doctor. Take a fiber supplement twice a day (Metamucil, FiberCon, Benefiber) Avoid excessive straining to have a bowel movement Do not go for more than 3 days without a bowel movement.  Take a regular Fleet enema if you are constipated.  Call the office if unable to do this or no results.     Activity Resume activities as tolerated beginning tomorrow.  Avoid strenuous activities or sports for one week.    Call the office if you have any questions.  Call IMMEDIATELY if you should develop persistent heavy rectal bleeding, increase in pain, difficulty urinating or fever greater than 100 F.
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
## 2013-07-18 ENCOUNTER — Encounter (INDEPENDENT_AMBULATORY_CARE_PROVIDER_SITE_OTHER): Payer: Medicare Other | Admitting: General Surgery

## 2013-07-22 DIAGNOSIS — M47817 Spondylosis without myelopathy or radiculopathy, lumbosacral region: Secondary | ICD-10-CM | POA: Diagnosis not present

## 2013-08-02 ENCOUNTER — Telehealth: Payer: Self-pay | Admitting: *Deleted

## 2013-08-02 DIAGNOSIS — I509 Heart failure, unspecified: Secondary | ICD-10-CM

## 2013-08-02 DIAGNOSIS — E785 Hyperlipidemia, unspecified: Secondary | ICD-10-CM

## 2013-08-02 DIAGNOSIS — E7800 Pure hypercholesterolemia, unspecified: Secondary | ICD-10-CM | POA: Insufficient documentation

## 2013-08-02 NOTE — Telephone Encounter (Signed)
Orders placed per recall

## 2013-08-13 DIAGNOSIS — M533 Sacrococcygeal disorders, not elsewhere classified: Secondary | ICD-10-CM | POA: Diagnosis not present

## 2013-08-13 DIAGNOSIS — G894 Chronic pain syndrome: Secondary | ICD-10-CM | POA: Diagnosis not present

## 2013-08-26 DIAGNOSIS — M2550 Pain in unspecified joint: Secondary | ICD-10-CM | POA: Diagnosis not present

## 2013-08-27 DIAGNOSIS — M533 Sacrococcygeal disorders, not elsewhere classified: Secondary | ICD-10-CM | POA: Diagnosis not present

## 2013-08-27 DIAGNOSIS — M461 Sacroiliitis, not elsewhere classified: Secondary | ICD-10-CM | POA: Diagnosis not present

## 2013-08-28 ENCOUNTER — Other Ambulatory Visit: Payer: 59

## 2013-08-29 DIAGNOSIS — M064 Inflammatory polyarthropathy: Secondary | ICD-10-CM | POA: Diagnosis not present

## 2013-08-29 DIAGNOSIS — M2550 Pain in unspecified joint: Secondary | ICD-10-CM | POA: Diagnosis not present

## 2013-08-29 DIAGNOSIS — IMO0001 Reserved for inherently not codable concepts without codable children: Secondary | ICD-10-CM | POA: Diagnosis not present

## 2013-08-30 ENCOUNTER — Encounter: Payer: Self-pay | Admitting: Cardiovascular Disease

## 2013-08-30 ENCOUNTER — Ambulatory Visit (INDEPENDENT_AMBULATORY_CARE_PROVIDER_SITE_OTHER): Payer: 59 | Admitting: Cardiovascular Disease

## 2013-08-30 ENCOUNTER — Ambulatory Visit (INDEPENDENT_AMBULATORY_CARE_PROVIDER_SITE_OTHER): Payer: 59 | Admitting: *Deleted

## 2013-08-30 VITALS — BP 132/82 | HR 78 | Ht 62.0 in | Wt 182.8 lb

## 2013-08-30 DIAGNOSIS — IMO0001 Reserved for inherently not codable concepts without codable children: Secondary | ICD-10-CM

## 2013-08-30 DIAGNOSIS — E785 Hyperlipidemia, unspecified: Secondary | ICD-10-CM

## 2013-08-30 DIAGNOSIS — I10 Essential (primary) hypertension: Secondary | ICD-10-CM

## 2013-08-30 DIAGNOSIS — I509 Heart failure, unspecified: Secondary | ICD-10-CM | POA: Diagnosis not present

## 2013-08-30 DIAGNOSIS — M797 Fibromyalgia: Secondary | ICD-10-CM

## 2013-08-30 LAB — HEPATIC FUNCTION PANEL
ALK PHOS: 72 U/L (ref 39–117)
ALT: 19 U/L (ref 0–35)
AST: 19 U/L (ref 0–37)
Albumin: 4.3 g/dL (ref 3.5–5.2)
BILIRUBIN TOTAL: 0.3 mg/dL (ref 0.2–1.2)
Total Protein: 7.2 g/dL (ref 6.0–8.3)

## 2013-08-30 LAB — LIPID PANEL
CHOL/HDL RATIO: 2.6 ratio
CHOLESTEROL: 198 mg/dL (ref 0–200)
HDL: 75 mg/dL (ref >39)
LDL Cholesterol: 114 mg/dL — ABNORMAL HIGH (ref 0–99)
Triglycerides: 46 mg/dL (ref <150)
VLDL: 9 mg/dL (ref 0–40)

## 2013-08-30 LAB — BASIC METABOLIC PANEL
BUN: 13 mg/dL (ref 6–23)
CO2: 22 mEq/L (ref 19–32)
Calcium: 9.7 mg/dL (ref 8.4–10.5)
Chloride: 99 mEq/L (ref 96–112)
Creat: 0.74 mg/dL (ref 0.50–1.10)
Glucose, Bld: 101 mg/dL — ABNORMAL HIGH (ref 70–99)
POTASSIUM: 3.6 meq/L (ref 3.5–5.3)
SODIUM: 129 meq/L — AB (ref 135–145)

## 2013-08-30 NOTE — Assessment & Plan Note (Signed)
She does not sleep well.  I have suggested to her that some of fibromyalgia symptoms may be due to not sleeping well.   We had a long discussion about this.
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
## 2013-08-30 NOTE — Assessment & Plan Note (Signed)
We had a long discussion about improving her diet, regular exercise and weight loss.

## 2013-08-30 NOTE — Patient Instructions (Addendum)
Your physician wants you to follow-up in:6 MONTHS  You will receive a reminder letter in the mail two months in advance. If you don't receive a letter, please call our office to schedule the follow-up appointment.  Your physician recommends that you continue on your current medications as directed. Please refer to the Current Medication list given to you today.  [facility] Low Glycemic Diet (Source: [facility], 2006) Low Glycemic Foods (20-49) (Decrease risk of developing heart disease) Breakfast Cereals: All-Bran All-Bran Fruit 'n Oats Fiber One Oatmeal (not instant) Oat bran Fruits and fruit juices: (Limit to 1-2 servings per day) Apples Apricots (fresh & dried) Blackberries Blueberries Cherries Cranberries Peaches Pears Plums Prunes Grapefruit Raspberries Strawberries Tangerine Apple juice Grapefruit juice Tomato juice Beans and legumes (fresh-cooked): Black-eyed peas Butter beans Chick peas Lentils  Green beans Lima beans Kidney beans Navy beans Pinto beans Snow peas Non-starchy vegetables: Asparagus, avocado, broccoli, cabbage, cauliflower, celery, cucumber, greens, lettuce, mushrooms, peppers, tomatoes, okra, onions, spinach, summer squash Grains: Barley Bulgur Rye Wild rice Nuts and oils : Almonds Peanuts Sunflower seeds Hazelnuts Pecans Walnuts Oils that are liquid at room temperature Dairy, fish, meat, soy, and eggs: Milk, skim Lowfat cheese Yogurt, lowfat, fruit sugar sweetened Lean red meat Fish  Skinless chicken & [place] Shellfish Egg whites (up to 3 daily) Soy products  Egg yolks (up to 7 or _____ per week) Moderate Glycemic Foods (50-69) Breakfast Cereals: Bran Buds Bran Chex Just Right Mini-Wheats  Special K Swiss muesli Fruits: Banana (under-ripe) Dates Figs Grapes Kiwi Mango Oranges Raisins Fruit Juices: Cranberry juice Orange juice Beans and legumes: [place]-type baked beans Canned pinto, kidney, or navy beans Green  peas Vegetables: Beets Carrots  Sweet potato Yam Corn on the cob Breads: Pita (pocket) bread Oat bran bread Pumpernickel bread Rye bread Wheat bread, high fiber  Grains: Cornmeal Rice, brown Rice, white Couscous Pasta: Macaroni Pizza, cheese Ravioli, meat filled Spaghetti, white  Nuts: Cashews Macadamia Snacks: Chocolate Ice cream, lowfat Muffin Popcorn High Glycemic Foods (70-100)  Breakfast Cereals: Cheerios Corn Chex Corn Flakes Cream of Wheat Grape Nuts Grape Nut Flakes Grits Nutri-Grain Puffed Rice Puffed Wheat Rice Chex Rice Krispies Shredded Wheat Team Total Fruits: Pineapple Watermelon Banana (over-ripe) Beverages: Sodas, sweet tea, pineapple juice Vegetables: Potato, baked, boiled, fried, mashed [place] fries Canned or frozen corn Parsnips Winter squash Breads: Most breads (white and whole grain) Bagels Bread sticks Bread stuffing Kaiser roll Dinner rolls Grains: Rice, instant Tapioca, with milk Candy and most cookies Snacks: Donuts Corn chips Jelly beans Pretzels Pastries

## 2013-10-19 ENCOUNTER — Other Ambulatory Visit: Payer: Self-pay | Admitting: Cardiovascular Disease

## 2013-11-09 ENCOUNTER — Other Ambulatory Visit: Payer: Self-pay | Admitting: Cardiovascular Disease

## 2013-11-13 DIAGNOSIS — M545 Low back pain, unspecified: Secondary | ICD-10-CM | POA: Insufficient documentation

## 2013-11-13 DIAGNOSIS — M79671 Pain in right foot: Secondary | ICD-10-CM | POA: Insufficient documentation

## 2013-11-13 DIAGNOSIS — M79609 Pain in unspecified limb: Secondary | ICD-10-CM | POA: Diagnosis not present

## 2013-11-13 DIAGNOSIS — R894 Abnormal immunological findings in specimens from other organs, systems and tissues: Secondary | ICD-10-CM | POA: Diagnosis not present

## 2013-11-13 DIAGNOSIS — R768 Other specified abnormal immunological findings in serum: Secondary | ICD-10-CM | POA: Insufficient documentation

## 2013-12-05 ENCOUNTER — Emergency Department (HOSPITAL_BASED_OUTPATIENT_CLINIC_OR_DEPARTMENT_OTHER)
Admission: EM | Admit: 2013-12-05 | Discharge: 2013-12-05 | Disposition: A | Discharge status: Home / Self Care | Payer: 59 | Attending: Emergency Medicine | Admitting: Emergency Medicine

## 2013-12-05 ENCOUNTER — Emergency Department (HOSPITAL_COMMUNITY): Payer: 59

## 2013-12-05 ENCOUNTER — Encounter (HOSPITAL_BASED_OUTPATIENT_CLINIC_OR_DEPARTMENT_OTHER): Payer: Self-pay | Admitting: Emergency Medicine

## 2013-12-05 ENCOUNTER — Emergency Department (HOSPITAL_BASED_OUTPATIENT_CLINIC_OR_DEPARTMENT_OTHER): Payer: 59

## 2013-12-05 DIAGNOSIS — K589 Irritable bowel syndrome without diarrhea: Secondary | ICD-10-CM | POA: Diagnosis not present

## 2013-12-05 DIAGNOSIS — K648 Other hemorrhoids: Secondary | ICD-10-CM | POA: Insufficient documentation

## 2013-12-05 DIAGNOSIS — N200 Calculus of kidney: Secondary | ICD-10-CM | POA: Diagnosis not present

## 2013-12-05 DIAGNOSIS — R52 Pain, unspecified: Secondary | ICD-10-CM | POA: Diagnosis not present

## 2013-12-05 DIAGNOSIS — F329 Major depressive disorder, single episode, unspecified: Secondary | ICD-10-CM | POA: Diagnosis not present

## 2013-12-05 DIAGNOSIS — F411 Generalized anxiety disorder: Secondary | ICD-10-CM | POA: Diagnosis not present

## 2013-12-05 DIAGNOSIS — IMO0001 Reserved for inherently not codable concepts without codable children: Secondary | ICD-10-CM | POA: Insufficient documentation

## 2013-12-05 DIAGNOSIS — R51 Headache: Secondary | ICD-10-CM | POA: Diagnosis not present

## 2013-12-05 DIAGNOSIS — I10 Essential (primary) hypertension: Secondary | ICD-10-CM | POA: Diagnosis not present

## 2013-12-05 DIAGNOSIS — R569 Unspecified convulsions: Secondary | ICD-10-CM | POA: Insufficient documentation

## 2013-12-05 DIAGNOSIS — G43909 Migraine, unspecified, not intractable, without status migrainosus: Secondary | ICD-10-CM | POA: Insufficient documentation

## 2013-12-05 DIAGNOSIS — F3289 Other specified depressive episodes: Secondary | ICD-10-CM | POA: Diagnosis not present

## 2013-12-05 DIAGNOSIS — R509 Fever, unspecified: Secondary | ICD-10-CM

## 2013-12-05 DIAGNOSIS — F319 Bipolar disorder, unspecified: Secondary | ICD-10-CM | POA: Diagnosis not present

## 2013-12-05 DIAGNOSIS — R519 Headache, unspecified: Secondary | ICD-10-CM

## 2013-12-05 DIAGNOSIS — R110 Nausea: Secondary | ICD-10-CM | POA: Diagnosis not present

## 2013-12-05 LAB — CBC WITH DIFFERENTIAL/PLATELET
BASOS ABS: 0 10*3/uL (ref 0.0–0.1)
BASOS PCT: 0 % (ref 0–1)
EOS ABS: 0 10*3/uL (ref 0.0–0.7)
Eosinophils Relative: 1 % (ref 0–5)
HCT: 38.2 % (ref 36.0–46.0)
Hemoglobin: 12.7 g/dL (ref 12.0–15.0)
LYMPHS ABS: 1.3 10*3/uL (ref 0.7–4.0)
Lymphocytes Relative: 25 % (ref 12–46)
MCH: 29.1 pg (ref 26.0–34.0)
MCHC: 33.2 g/dL (ref 30.0–36.0)
MCV: 87.4 fL (ref 78.0–100.0)
Monocytes Absolute: 0.6 10*3/uL (ref 0.1–1.0)
Monocytes Relative: 11 % (ref 3–12)
NEUTROS PCT: 63 % (ref 43–77)
Neutro Abs: 3.3 10*3/uL (ref 1.7–7.7)
PLATELETS: 270 10*3/uL (ref 150–400)
RBC: 4.37 MIL/uL (ref 3.87–5.11)
RDW: 12.9 % (ref 11.5–15.5)
WBC: 5.2 10*3/uL (ref 4.0–10.5)

## 2013-12-05 LAB — CSF CELL COUNT WITH DIFFERENTIAL
RBC Count, CSF: 0 /mm3
Tube #: 3
WBC, CSF: 1 /mm3 (ref 0–5)

## 2013-12-05 LAB — URINALYSIS, ROUTINE W REFLEX MICROSCOPIC
BILIRUBIN URINE: NEGATIVE
GLUCOSE, UA: NEGATIVE mg/dL
Hgb urine dipstick: NEGATIVE
KETONES UR: NEGATIVE mg/dL
Nitrite: NEGATIVE
PH: 5.5 (ref 5.0–8.0)
Protein, ur: NEGATIVE mg/dL
SPECIFIC GRAVITY, URINE: 1.019 (ref 1.005–1.030)
Urobilinogen, UA: 1 mg/dL (ref 0.0–1.0)

## 2013-12-05 LAB — COMPREHENSIVE METABOLIC PANEL
ALBUMIN: 3.8 g/dL (ref 3.5–5.2)
ALK PHOS: 82 U/L (ref 39–117)
ALT: 21 U/L (ref 0–35)
AST: 23 U/L (ref 0–37)
Anion gap: 14 (ref 5–15)
BUN: 12 mg/dL (ref 6–23)
CALCIUM: 9.4 mg/dL (ref 8.4–10.5)
CO2: 23 mEq/L (ref 19–32)
Chloride: 102 mEq/L (ref 96–112)
Creatinine, Ser: 0.8 mg/dL (ref 0.50–1.10)
GFR calc Af Amer: >90 mL/min (ref >90)
GFR calc non Af Amer: >90 mL/min (ref >90)
Glucose, Bld: 126 mg/dL — ABNORMAL HIGH (ref 70–99)
POTASSIUM: 4 meq/L (ref 3.7–5.3)
SODIUM: 139 meq/L (ref 137–147)
TOTAL PROTEIN: 7 g/dL (ref 6.0–8.3)
Total Bilirubin: 0.3 mg/dL (ref 0.3–1.2)

## 2013-12-05 LAB — URINE MICROSCOPIC-ADD ON

## 2013-12-05 LAB — PROTEIN AND GLUCOSE, CSF
Glucose, CSF: 66 mg/dL (ref 43–76)
Total  Protein, CSF: 15 mg/dL (ref 15–45)

## 2013-12-05 LAB — PREGNANCY, URINE: PREG TEST UR: NEGATIVE

## 2013-12-05 LAB — GRAM STAIN

## 2013-12-05 MED ORDER — SODIUM CHLORIDE 0.9 % IV BOLUS (SEPSIS)
1000.0000 mL | Freq: Once | INTRAVENOUS | Status: AC
  Administered 2013-12-05: 1000 mL via INTRAVENOUS

## 2013-12-05 MED ORDER — MORPHINE SULFATE 4 MG/ML IJ SOLN
4.0000 mg | Freq: Once | INTRAMUSCULAR | Status: AC
  Administered 2013-12-05: 4 mg via INTRAVENOUS
  Filled 2013-12-05: qty 1

## 2013-12-05 MED ORDER — KETOROLAC TROMETHAMINE 30 MG/ML IJ SOLN
30.0000 mg | Freq: Once | INTRAMUSCULAR | Status: AC
  Administered 2013-12-05: 30 mg via INTRAVENOUS
  Filled 2013-12-05: qty 1

## 2013-12-05 MED ORDER — RIZATRIPTAN BENZOATE 5 MG PO TABS: 5.0000 mg | ORAL_TABLET | ORAL | Status: DC | PRN

## 2013-12-05 MED ORDER — DEXTROSE 5 % IV SOLN
2.0000 g | Freq: Once | INTRAVENOUS | Status: AC
  Administered 2013-12-05: 2 g via INTRAVENOUS
  Filled 2013-12-05: qty 2

## 2013-12-05 MED ORDER — METOCLOPRAMIDE HCL 5 MG/ML IJ SOLN
10.0000 mg | Freq: Once | INTRAMUSCULAR | Status: AC
  Administered 2013-12-05: 10 mg via INTRAVENOUS
  Filled 2013-12-05: qty 2

## 2013-12-05 MED ORDER — ONDANSETRON HCL 4 MG/2ML IJ SOLN
4.0000 mg | Freq: Once | INTRAMUSCULAR | Status: AC
  Administered 2013-12-05: 4 mg via INTRAVENOUS
  Filled 2013-12-05: qty 2

## 2013-12-05 MED ORDER — HYDROMORPHONE HCL PF 1 MG/ML IJ SOLN
1.0000 mg | Freq: Once | INTRAMUSCULAR | Status: AC
  Administered 2013-12-05: 1 mg via INTRAVENOUS
  Filled 2013-12-05: qty 1

## 2013-12-05 MED ORDER — ACETAMINOPHEN 325 MG PO TABS
ORAL_TABLET | ORAL | Status: AC
  Administered 2013-12-05: 650 mg
  Filled 2013-12-05: qty 2

## 2013-12-05 MED ORDER — DIPHENHYDRAMINE HCL 50 MG/ML IJ SOLN
25.0000 mg | Freq: Once | INTRAMUSCULAR | Status: AC
  Administered 2013-12-05: 25 mg via INTRAVENOUS
  Filled 2013-12-05: qty 1

## 2013-12-05 MED ORDER — VANCOMYCIN HCL IN DEXTROSE 1-5 GM/200ML-% IV SOLN
1000.0000 mg | Freq: Once | INTRAVENOUS | Status: AC
  Administered 2013-12-05: 1000 mg via INTRAVENOUS
  Filled 2013-12-05: qty 200

## 2013-12-05 NOTE — Discharge Instructions (Signed)
Headaches, Frequently Asked Questions MIGRAINE HEADACHES Q: What is migraine? What causes it? How can I treat it? A: Generally, migraine headaches begin as a dull ache. Then they develop into a constant, throbbing, and pulsating pain. You may experience pain at the temples. You may experience pain at the front or back of one or both sides of the head. The pain is usually accompanied by a combination of:  Nausea.  Vomiting.  Sensitivity to light and noise. Some people (about 15%) experience an aura (see below) before an attack. The cause of migraine is believed to be chemical reactions in the brain. Treatment for migraine may include over-the-counter or prescription medications. It may also include self-help techniques. These include relaxation training and biofeedback.  Q: What is an aura? A: About 15% of people with migraine get an "aura". This is a sign of neurological symptoms that occur before a migraine headache. You may see wavy or jagged lines, dots, or flashing lights. You might experience tunnel vision or blind spots in one or both eyes. The aura can include visual or auditory hallucinations (something imagined). It may include disruptions in smell (such as strange odors), taste or touch. Other symptoms include:  Numbness.  A "pins and needles" sensation.  Difficulty in recalling or speaking the correct word. These neurological events may last as long as 60 minutes. These symptoms will fade as the headache begins. Q: What is a trigger? A: Certain physical or environmental factors can lead to or "trigger" a migraine. These include:  Foods.  Hormonal changes.  Weather.  Stress. It is important to remember that triggers are different for everyone. To help prevent migraine attacks, you need to figure out which triggers affect you. Keep a headache diary. This is a good way to track triggers. The diary will help you talk to your healthcare professional about your condition. Q: Does  weather affect migraines? A: Bright sunshine, hot, humid conditions, and drastic changes in barometric pressure may lead to, or "trigger," a migraine attack in some people. But studies have shown that weather does not act as a trigger for everyone with migraines. Q: What is the link between migraine and hormones? A: Hormones start and regulate many of your body's functions. Hormones keep your body in balance within a constantly changing environment. The levels of hormones in your body are unbalanced at times. Examples are during menstruation, pregnancy, or menopause. That can lead to a migraine attack. In fact, about three quarters of all women with migraine report that their attacks are related to the menstrual cycle.  Q: Is there an increased risk of stroke for migraine sufferers? A: The likelihood of a migraine attack causing a stroke is very remote. That is not to say that migraine sufferers cannot have a stroke associated with their migraines. In persons under age 53, the most common associated factor for stroke is migraine headache. But over the course of a person's normal life span, the occurrence of migraine headache may actually be associated with a reduced risk of dying from cerebrovascular disease due to stroke.  Q: What are acute medications for migraine? A: Acute medications are used to treat the pain of the headache after it has started. Examples over-the-counter medications, NSAIDs, ergots, and triptans.  Q: What are the triptans? A: Triptans are the newest class of abortive medications. They are specifically targeted to treat migraine. Triptans are vasoconstrictors. They moderate some chemical reactions in the brain. The triptans work on receptors in your brain. Triptans help  to restore the balance of a neurotransmitter called serotonin. Fluctuations in levels of serotonin are thought to be a main cause of migraine.  Q: Are over-the-counter medications for migraine effective? A:  Over-the-counter, or "OTC," medications may be effective in relieving mild to moderate pain and associated symptoms of migraine. But you should see your caregiver before beginning any treatment regimen for migraine.  Q: What are preventive medications for migraine? A: Preventive medications for migraine are sometimes referred to as "prophylactic" treatments. They are used to reduce the frequency, severity, and length of migraine attacks. Examples of preventive medications include antiepileptic medications, antidepressants, beta-blockers, calcium channel blockers, and NSAIDs (nonsteroidal anti-inflammatory drugs). Q: Why are anticonvulsants used to treat migraine? A: During the past few years, there has been an increased interest in antiepileptic drugs for the prevention of migraine. They are sometimes referred to as "anticonvulsants". Both epilepsy and migraine may be caused by similar reactions in the brain.  Q: Why are antidepressants used to treat migraine? A: Antidepressants are typically used to treat people with depression. They may reduce migraine frequency by regulating chemical levels, such as serotonin, in the brain.  Q: What alternative therapies are used to treat migraine? A: The term "alternative therapies" is often used to describe treatments considered outside the scope of conventional Western medicine. Examples of alternative therapy include acupuncture, acupressure, and yoga. Another common alternative treatment is herbal therapy. Some herbs are believed to relieve headache pain. Always discuss alternative therapies with your caregiver before proceeding. Some herbal products contain arsenic and other toxins. TENSION HEADACHES Q: What is a tension-type headache? What causes it? How can I treat it? A: Tension-type headaches occur randomly. They are often the result of temporary stress, anxiety, fatigue, or anger. Symptoms include soreness in your temples, a tightening band-like sensation  around your head (a "vice-like" ache). Symptoms can also include a pulling feeling, pressure sensations, and contracting head and neck muscles. The headache begins in your forehead, temples, or the back of your head and neck. Treatment for tension-type headache may include over-the-counter or prescription medications. Treatment may also include self-help techniques such as relaxation training and biofeedback. CLUSTER HEADACHES Q: What is a cluster headache? What causes it? How can I treat it? A: Cluster headache gets its name because the attacks come in groups. The pain arrives with little, if any, warning. It is usually on one side of the head. A tearing or bloodshot eye and a runny nose on the same side of the headache may also accompany the pain. Cluster headaches are believed to be caused by chemical reactions in the brain. They have been described as the most severe and intense of any headache type. Treatment for cluster headache includes prescription medication and oxygen. SINUS HEADACHES Q: What is a sinus headache? What causes it? How can I treat it? A: When a cavity in the bones of the face and skull (a sinus) becomes inflamed, the inflammation will cause localized pain. This condition is usually the result of an allergic reaction, a tumor, or an infection. If your headache is caused by a sinus blockage, such as an infection, you will probably have a fever. An x-ray will confirm a sinus blockage. Your caregiver's treatment might include antibiotics for the infection, as well as antihistamines or decongestants.  REBOUND HEADACHES Q: What is a rebound headache? What causes it? How can I treat it? A: A pattern of taking acute headache medications too often can lead to a condition known as "rebound headache."  A pattern of taking too much headache medication includes taking it more than 2 days per week or in excessive amounts. That means more than the label or a caregiver advises. With rebound  headaches, your medications not only stop relieving pain, they actually begin to cause headaches. Doctors treat rebound headache by tapering the medication that is being overused. Sometimes your caregiver will gradually substitute a different type of treatment or medication. Stopping may be a challenge. Regularly overusing a medication increases the potential for serious side effects. Consult a caregiver if you regularly use headache medications more than 2 days per week or more than the label advises. ADDITIONAL QUESTIONS AND ANSWERS Q: What is biofeedback? A: Biofeedback is a self-help treatment. Biofeedback uses special equipment to monitor your body's involuntary physical responses. Biofeedback monitors:  Breathing.  Pulse.  Heart rate.  Temperature.  Muscle tension.  Brain activity. Biofeedback helps you refine and perfect your relaxation exercises. You learn to control the physical responses that are related to stress. Once the technique has been mastered, you do not need the equipment any more. Q: Are headaches hereditary? A: Four out of five (80%) of people that suffer report a family history of migraine. Scientists are not sure if this is genetic or a family predisposition. Despite the uncertainty, a child has a 50% chance of having migraine if one parent suffers. The child has a 75% chance if both parents suffer.  Q: Can children get headaches? A: By the time they reach high school, most young people have experienced some type of headache. Many safe and effective approaches or medications can prevent a headache from occurring or stop it after it has begun.  Q: What type of doctor should I see to diagnose and treat my headache? A: Start with your primary caregiver. Discuss his or her experience and approach to headaches. Discuss methods of classification, diagnosis, and treatment. Your caregiver may decide to recommend you to a headache specialist, depending upon your symptoms or other  physical conditions. Having diabetes, allergies, etc., may require a more comprehensive and inclusive approach to your headache. The National Head[employer] Foundation will provide, upon request, a list of [facility] physician members in your state. Document Released: 08/06/2003 Document Revised: 08/08/2011 Document Reviewed: 01/14/2008 [facility] Patient Information 2015 [place], [place]. This information is not intended to replace advice given to you by your health care provider. Make sure you discuss any questions you have with your health care provider.   Fever, Adult A fever is a higher than normal body temperature. In an adult, an oral temperature around 98.6 F (37 C) is considered normal. A temperature of 100.4 F (38 C) or higher is generally considered a fever. Mild or moderate fevers generally have no long-term effects and often do not require treatment. Extreme fever (greater than or equal to 106 F or 41.1 C) can cause seizures. The sweating that may occur with repeated or prolonged fever may cause dehydration. Elderly people can develop confusion during a fever. A measured temperature can vary with:  Age.  Time of day.  Method of measurement (mouth, underarm, rectal, or ear). The fever is confirmed by taking a temperature with a thermometer. Temperatures can be taken different ways. Some methods are accurate and some are not.  An oral temperature is used most commonly. Electronic thermometers are fast and accurate.  An ear temperature will only be accurate if the thermometer is positioned as recommended by the manufacturer.  A rectal temperature is accurate and done for  those adults who have a condition where an oral temperature cannot be taken.  An underarm (axillary) temperature is not accurate and not recommended. Fever is a symptom, not a disease.  CAUSES   Infections commonly cause fever.  Some noninfectious causes for fever include:  Some arthritis conditions.  Some thyroid or  adrenal gland conditions.  Some immune system conditions.  Some types of cancer.  A medicine reaction.  High doses of certain street drugs such as methamphetamine.  Dehydration.  Exposure to high outside or room temperatures.  Occasionally, the source of a fever cannot be determined. This is sometimes called a "fever of unknown origin" (FUO).  Some situations may lead to a temporary rise in body temperature that may go away on its own. Examples are:  Childbirth.  Surgery.  Intense exercise. HOME CARE INSTRUCTIONS   Take appropriate medicines for fever. Follow dosing instructions carefully. If you use acetaminophen to reduce the fever, be careful to avoid taking other medicines that also contain acetaminophen. Do not take aspirin for a fever if you are younger than age 76. There is an association with Reye's syndrome. Reye's syndrome is a rare but potentially deadly disease.  If an infection is present and antibiotics have been prescribed, take them as directed. Finish them even if you start to feel better.  Rest as needed.  Maintain an adequate fluid intake. To prevent dehydration during an illness with prolonged or recurrent fever, you may need to drink extra fluid.Drink enough fluids to keep your urine clear or pale yellow.  Sponging or bathing with room temperature water may help reduce body temperature. Do not use ice water or alcohol sponge baths.  Dress comfortably, but do not over-bundle. SEEK MEDICAL CARE IF:   You are unable to keep fluids down.  You develop vomiting or diarrhea.  You are not feeling at least partly better after 3 days.  You develop new symptoms or problems. SEEK IMMEDIATE MEDICAL CARE IF:   You have shortness of breath or trouble breathing.  You develop excessive weakness.  You are dizzy or you faint.  You are extremely thirsty or you are making little or no urine.  You develop new pain that was not there before (such as in the head,  neck, chest, back, or abdomen).  You have persistant vomiting and diarrhea for more than 1 to 2 days.  You develop a stiff neck or your eyes become sensitive to light.  You develop a skin rash.  You have a fever or persistent symptoms for more than 2 to 3 days.  You have a fever and your symptoms suddenly get worse. MAKE SURE YOU:   Understand these instructions.  Will watch your condition.  Will get help right away if you are not doing well or get worse. Document Released: 11/09/2000 Document Revised: 08/08/2011 Document Reviewed: 03/17/2011 [facility] Patient Information 2015 [place], [place]. This information is not intended to replace advice given to you by your health care provider. Make sure you discuss any questions you have with your health care provider.

## 2013-12-05 NOTE — ED Notes (Signed)
PT comfortable with discharge and follow up instructions. Prescriptions x1.

## 2013-12-05 NOTE — ED Notes (Signed)
Woke w ha approx 2 hrs ago  Sensitive to light,  Some nausea  States ha is no as bad as when waking

## 2013-12-05 NOTE — ED Notes (Addendum)
Pt woke about 2 hours ago w ha,  Light increased pain, nausea. Neck soreness

## 2013-12-05 NOTE — ED Notes (Addendum)
Pt to fluoro without distress.  Ambulated to restroom with RN assistance prior.

## 2013-12-05 NOTE — ED Provider Notes (Signed)
Medical screening examination/treatment/procedure(s) were performed by non-physician practitioner and as supervising physician I was immediately available for consultation/collaboration.   EKG Interpretation None        Blanchard Kelch, MD 12/05/13 2015

## 2013-12-05 NOTE — Procedures (Signed)
See rad dictation.
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
## 2013-12-06 LAB — HERPES SIMPLEX VIRUS(HSV) DNA BY PCR
HSV 1 DNA: NOT DETECTED
HSV 2 DNA: NOT DETECTED

## 2013-12-08 LAB — CSF CULTURE: CULTURE: NO GROWTH

## 2013-12-08 LAB — CSF CULTURE W GRAM STAIN

## 2013-12-10 DIAGNOSIS — M545 Low back pain, unspecified: Secondary | ICD-10-CM | POA: Diagnosis not present

## 2013-12-10 DIAGNOSIS — R894 Abnormal immunological findings in specimens from other organs, systems and tissues: Secondary | ICD-10-CM | POA: Diagnosis not present

## 2013-12-10 DIAGNOSIS — M2550 Pain in unspecified joint: Secondary | ICD-10-CM | POA: Diagnosis not present

## 2013-12-10 DIAGNOSIS — M359 Systemic involvement of connective tissue, unspecified: Secondary | ICD-10-CM | POA: Diagnosis not present

## 2013-12-11 LAB — CULTURE, BLOOD (ROUTINE X 2)
CULTURE: NO GROWTH
CULTURE: NO GROWTH

## 2013-12-24 DIAGNOSIS — M79609 Pain in unspecified limb: Secondary | ICD-10-CM | POA: Diagnosis not present

## 2013-12-24 DIAGNOSIS — R894 Abnormal immunological findings in specimens from other organs, systems and tissues: Secondary | ICD-10-CM | POA: Diagnosis not present

## 2013-12-24 DIAGNOSIS — IMO0001 Reserved for inherently not codable concepts without codable children: Secondary | ICD-10-CM | POA: Diagnosis not present

## 2013-12-26 ENCOUNTER — Other Ambulatory Visit: Payer: Self-pay | Admitting: *Deleted

## 2014-01-08 ENCOUNTER — Other Ambulatory Visit: Payer: Self-pay | Admitting: Obstetrics and Gynecology

## 2014-01-08 NOTE — Telephone Encounter (Signed)
Received request from Nurse fax box, documents faxed for surgical clearance. To: Dr.Richard Tarvon  Fax number: [phone] Attention: 8.12.15/km

## 2014-01-16 ENCOUNTER — Encounter (HOSPITAL_COMMUNITY): Payer: Self-pay | Admitting: Pharmacist

## 2014-01-17 ENCOUNTER — Inpatient Hospital Stay (HOSPITAL_COMMUNITY)
Admission: RE | Admit: 2014-01-17 | Discharge: 2014-01-17 | Disposition: A | Discharge status: Home / Self Care | Payer: 59 | Source: Ambulatory Visit

## 2014-01-20 ENCOUNTER — Encounter (HOSPITAL_COMMUNITY): Admission: RE | Payer: Self-pay | Source: Ambulatory Visit

## 2014-01-20 ENCOUNTER — Ambulatory Visit (HOSPITAL_COMMUNITY): Admission: RE | Admit: 2014-01-20 | Payer: 59 | Source: Ambulatory Visit | Admitting: Obstetrics and Gynecology

## 2014-01-20 SURGERY — ROBOTIC ASSISTED TOTAL HYSTERECTOMY
Anesthesia: General

## 2014-02-28 ENCOUNTER — Other Ambulatory Visit: Payer: Self-pay

## 2014-02-28 MED ORDER — LINACLOTIDE 290 MCG PO CAPS: 290.0000 ug | ORAL_CAPSULE | ORAL | Status: DC

## 2014-03-04 DIAGNOSIS — N3289 Other specified disorders of bladder: Secondary | ICD-10-CM | POA: Insufficient documentation

## 2014-03-20 ENCOUNTER — Telehealth: Payer: Self-pay | Admitting: Internal Medicine

## 2014-03-21 NOTE — Telephone Encounter (Signed)
Did prior authorization with Cover My Meds # [id number] for her Linzess 281mg capsules.  Dx:  IBS K58.9, Pelvic Floor dysfunction N81.84. [place] 36 months.  Patient informed.  [facility] informed.

## 2014-04-23 DIAGNOSIS — M47816 Spondylosis without myelopathy or radiculopathy, lumbar region: Secondary | ICD-10-CM | POA: Insufficient documentation

## 2014-05-05 ENCOUNTER — Other Ambulatory Visit: Payer: Self-pay | Admitting: Cardiovascular Disease

## 2014-06-24 DIAGNOSIS — M47897 Other spondylosis, lumbosacral region: Secondary | ICD-10-CM | POA: Diagnosis not present

## 2014-06-24 DIAGNOSIS — M545 Low back pain: Secondary | ICD-10-CM | POA: Diagnosis not present

## 2014-06-24 DIAGNOSIS — M47816 Spondylosis without myelopathy or radiculopathy, lumbar region: Secondary | ICD-10-CM | POA: Diagnosis not present

## 2014-07-02 DIAGNOSIS — J329 Chronic sinusitis, unspecified: Secondary | ICD-10-CM | POA: Diagnosis not present

## 2014-07-16 DIAGNOSIS — M47817 Spondylosis without myelopathy or radiculopathy, lumbosacral region: Secondary | ICD-10-CM | POA: Diagnosis not present

## 2014-08-05 DIAGNOSIS — M47816 Spondylosis without myelopathy or radiculopathy, lumbar region: Secondary | ICD-10-CM | POA: Diagnosis not present

## 2014-08-05 DIAGNOSIS — G894 Chronic pain syndrome: Secondary | ICD-10-CM | POA: Diagnosis not present

## 2014-08-09 ENCOUNTER — Other Ambulatory Visit: Payer: Self-pay | Admitting: Cardiovascular Disease

## 2014-08-12 DIAGNOSIS — M545 Low back pain: Secondary | ICD-10-CM | POA: Diagnosis not present

## 2014-08-12 DIAGNOSIS — G8929 Other chronic pain: Secondary | ICD-10-CM | POA: Diagnosis not present

## 2014-08-12 DIAGNOSIS — R102 Pelvic and perineal pain: Secondary | ICD-10-CM | POA: Diagnosis not present

## 2014-08-21 DIAGNOSIS — R102 Pelvic and perineal pain: Secondary | ICD-10-CM | POA: Diagnosis not present

## 2014-08-21 DIAGNOSIS — N3010 Interstitial cystitis (chronic) without hematuria: Secondary | ICD-10-CM | POA: Diagnosis not present

## 2014-08-21 DIAGNOSIS — K5909 Other constipation: Secondary | ICD-10-CM | POA: Diagnosis not present

## 2014-08-21 DIAGNOSIS — N9489 Other specified conditions associated with female genital organs and menstrual cycle: Secondary | ICD-10-CM | POA: Diagnosis not present

## 2014-08-21 DIAGNOSIS — K589 Irritable bowel syndrome without diarrhea: Secondary | ICD-10-CM | POA: Diagnosis not present

## 2014-08-21 DIAGNOSIS — R3911 Hesitancy of micturition: Secondary | ICD-10-CM | POA: Diagnosis not present

## 2014-08-21 DIAGNOSIS — R3915 Urgency of urination: Secondary | ICD-10-CM | POA: Diagnosis not present

## 2014-08-21 DIAGNOSIS — R300 Dysuria: Secondary | ICD-10-CM | POA: Diagnosis not present

## 2014-08-21 DIAGNOSIS — N3941 Urge incontinence: Secondary | ICD-10-CM | POA: Diagnosis not present

## 2014-08-21 DIAGNOSIS — M797 Fibromyalgia: Secondary | ICD-10-CM | POA: Diagnosis not present

## 2014-08-21 DIAGNOSIS — R350 Frequency of micturition: Secondary | ICD-10-CM | POA: Diagnosis not present

## 2014-08-21 DIAGNOSIS — F329 Major depressive disorder, single episode, unspecified: Secondary | ICD-10-CM | POA: Diagnosis not present

## 2014-09-12 ENCOUNTER — Other Ambulatory Visit: Payer: Self-pay | Admitting: Cardiovascular Disease

## 2014-09-12 DIAGNOSIS — F419 Anxiety disorder, unspecified: Secondary | ICD-10-CM | POA: Diagnosis not present

## 2014-09-12 DIAGNOSIS — N3941 Urge incontinence: Secondary | ICD-10-CM | POA: Diagnosis not present

## 2014-09-12 DIAGNOSIS — N39498 Other specified urinary incontinence: Secondary | ICD-10-CM | POA: Diagnosis not present

## 2014-09-12 DIAGNOSIS — N3010 Interstitial cystitis (chronic) without hematuria: Secondary | ICD-10-CM | POA: Diagnosis not present

## 2014-09-12 DIAGNOSIS — F329 Major depressive disorder, single episode, unspecified: Secondary | ICD-10-CM | POA: Diagnosis not present

## 2014-10-07 ENCOUNTER — Other Ambulatory Visit: Payer: Self-pay | Admitting: Cardiovascular Disease

## 2014-11-14 ENCOUNTER — Other Ambulatory Visit: Payer: Self-pay

## 2014-11-14 MED ORDER — LISINOPRIL 10 MG PO TABS: 10.0000 mg | ORAL_TABLET | Freq: Every day | ORAL | Status: DC

## 2014-11-14 MED ORDER — DILTIAZEM HCL ER COATED BEADS 240 MG PO CP24: ORAL_CAPSULE | ORAL | Status: DC

## 2015-01-08 DIAGNOSIS — H6522 Chronic serous otitis media, left ear: Secondary | ICD-10-CM | POA: Diagnosis not present

## 2015-01-08 DIAGNOSIS — J320 Chronic maxillary sinusitis: Secondary | ICD-10-CM | POA: Diagnosis not present

## 2015-01-08 DIAGNOSIS — H6122 Impacted cerumen, left ear: Secondary | ICD-10-CM | POA: Diagnosis not present

## 2015-01-08 DIAGNOSIS — H6062 Unspecified chronic otitis externa, left ear: Secondary | ICD-10-CM | POA: Diagnosis not present

## 2015-01-08 DIAGNOSIS — J040 Acute laryngitis: Secondary | ICD-10-CM | POA: Diagnosis not present

## 2015-01-08 DIAGNOSIS — H6692 Otitis media, unspecified, left ear: Secondary | ICD-10-CM | POA: Diagnosis not present

## 2015-01-08 DIAGNOSIS — J322 Chronic ethmoidal sinusitis: Secondary | ICD-10-CM | POA: Diagnosis not present
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
## 2015-01-26 ENCOUNTER — Encounter: Payer: Self-pay | Admitting: Cardiovascular Disease

## 2015-01-26 ENCOUNTER — Ambulatory Visit (INDEPENDENT_AMBULATORY_CARE_PROVIDER_SITE_OTHER): Payer: 59 | Admitting: Cardiovascular Disease

## 2015-01-26 ENCOUNTER — Ambulatory Visit: Payer: Medicare Other | Admitting: Cardiovascular Disease

## 2015-01-26 VITALS — BP 108/64 | HR 80 | Ht 63.0 in | Wt 192.1 lb

## 2015-01-26 DIAGNOSIS — I10 Essential (primary) hypertension: Secondary | ICD-10-CM

## 2015-01-26 MED ORDER — DILTIAZEM HCL ER COATED BEADS 240 MG PO CP24: ORAL_CAPSULE | ORAL | Status: DC

## 2015-01-26 MED ORDER — LISINOPRIL 10 MG PO TABS
10.0000 mg | ORAL_TABLET | Freq: Every day | ORAL | Status: DC
Start: 2015-01-26 — End: 2016-02-01

## 2015-01-26 NOTE — Patient Instructions (Signed)
Medication Instructions: Your physician recommends that you continue on your current medications as directed. Please refer to the Current Medication list given to you today.  Labwork: none  Testing/Procedures: none  Follow-Up: Your physician wants you to follow-up in: 1 year ov You will receive a reminder letter in the mail two months in advance. If you don't receive a letter, please call our office to schedule the follow-up appointment.

## 2015-04-08 DIAGNOSIS — M545 Low back pain: Secondary | ICD-10-CM | POA: Diagnosis not present

## 2015-04-08 DIAGNOSIS — G8929 Other chronic pain: Secondary | ICD-10-CM | POA: Diagnosis not present

## 2015-04-08 DIAGNOSIS — M533 Sacrococcygeal disorders, not elsewhere classified: Secondary | ICD-10-CM | POA: Diagnosis not present

## 2015-04-08 DIAGNOSIS — Z1389 Encounter for screening for other disorder: Secondary | ICD-10-CM | POA: Diagnosis not present

## 2015-07-09 ENCOUNTER — Encounter: Payer: Self-pay | Admitting: Podiatry

## 2015-07-09 ENCOUNTER — Ambulatory Visit (INDEPENDENT_AMBULATORY_CARE_PROVIDER_SITE_OTHER): Payer: 59 | Admitting: Podiatry

## 2015-07-09 ENCOUNTER — Ambulatory Visit (INDEPENDENT_AMBULATORY_CARE_PROVIDER_SITE_OTHER): Payer: 59

## 2015-07-09 VITALS — BP 121/74 | HR 77 | Resp 16 | Ht 63.0 in | Wt 190.0 lb

## 2015-07-09 DIAGNOSIS — M779 Enthesopathy, unspecified: Secondary | ICD-10-CM

## 2015-07-09 DIAGNOSIS — M79671 Pain in right foot: Secondary | ICD-10-CM

## 2015-07-09 MED ORDER — TRIAMCINOLONE ACETONIDE 10 MG/ML IJ SUSP
10.0000 mg | Freq: Once | INTRAMUSCULAR | Status: AC
  Administered 2015-07-09: 10 mg

## 2015-07-09 NOTE — Progress Notes (Signed)
Subjective:     Patient ID: [name], female   DOB: [birth year], 45 y.o.   MRN: [id number]  HPI patient presents to the outside of the right foot stating it's been really bothering her and that it's been going on for release to month and she does not remember specific injury and states she does have a history of heel pain   Review of Systems  All other systems reviewed and are negative.      Objective:   Physical Exam  Constitutional: She is oriented to person, place, and time.  Cardiovascular: Intact distal pulses.   Musculoskeletal: Normal range of motion.  Neurological: She is oriented to person, place, and time.  Skin: Skin is warm.  Nursing note and vitals reviewed.  neurovascular status found to be intact with muscle strength adequate range of motion within normal limits with patient found to have quite a bit of discomfort at the peroneal insertion base of fifth metatarsal right with fluid buildup and soreness when palpated. Patient has good digital perfusion is well oriented 3     Assessment:     Peroneal tendinitis base of fifth metatarsal right with inflammation fluid buildup    Plan:     H&P and x-rays reviewed. I went ahead today and did careful sheath injection right 3 mg Kenalog 5 mg Xylocaine base of fifth metatarsal and applied fascial brace to lift up the lateral side of the foot. Instructed on ice reduced activity and reappoint 2 weeks

## 2015-07-09 NOTE — Progress Notes (Signed)
   Subjective:    Patient ID: [name], female    DOB: [birth year], 45 y.o.   MRN: [id number]  HPI Patient presents with foot pain in their right foot; heel & arch-lateral side. Pt stated, "hurts to walk and after resting, hurts when get up"; x1 month.   Review of Systems  Constitutional: Positive for fatigue and unexpected weight change.  Neurological: Positive for seizures.  All other systems reviewed and are negative.      Objective:   Physical Exam        Assessment & Plan:

## 2015-07-09 NOTE — Patient Instructions (Signed)

## 2015-07-24 ENCOUNTER — Ambulatory Visit (INDEPENDENT_AMBULATORY_CARE_PROVIDER_SITE_OTHER): Payer: 59 | Admitting: Podiatry

## 2015-07-24 ENCOUNTER — Encounter: Payer: Self-pay | Admitting: Podiatry

## 2015-07-24 ENCOUNTER — Ambulatory Visit (INDEPENDENT_AMBULATORY_CARE_PROVIDER_SITE_OTHER): Payer: 59

## 2015-07-24 VITALS — BP 106/67 | HR 86 | Resp 12

## 2015-07-24 DIAGNOSIS — M779 Enthesopathy, unspecified: Secondary | ICD-10-CM

## 2015-07-24 DIAGNOSIS — D2262 Melanocytic nevi of left upper limb, including shoulder: Secondary | ICD-10-CM | POA: Diagnosis not present

## 2015-07-24 DIAGNOSIS — L918 Other hypertrophic disorders of the skin: Secondary | ICD-10-CM | POA: Diagnosis not present

## 2015-07-24 DIAGNOSIS — D1801 Hemangioma of skin and subcutaneous tissue: Secondary | ICD-10-CM | POA: Diagnosis not present

## 2015-07-24 DIAGNOSIS — D2271 Melanocytic nevi of right lower limb, including hip: Secondary | ICD-10-CM | POA: Diagnosis not present

## 2015-07-24 DIAGNOSIS — L738 Other specified follicular disorders: Secondary | ICD-10-CM | POA: Diagnosis not present

## 2015-07-24 DIAGNOSIS — L821 Other seborrheic keratosis: Secondary | ICD-10-CM | POA: Diagnosis not present

## 2015-07-24 DIAGNOSIS — L812 Freckles: Secondary | ICD-10-CM | POA: Diagnosis not present

## 2015-07-24 DIAGNOSIS — S92301A Fracture of unspecified metatarsal bone(s), right foot, initial encounter for closed fracture: Secondary | ICD-10-CM | POA: Diagnosis not present

## 2015-07-24 DIAGNOSIS — D225 Melanocytic nevi of trunk: Secondary | ICD-10-CM | POA: Diagnosis not present

## 2015-07-24 DIAGNOSIS — D485 Neoplasm of uncertain behavior of skin: Secondary | ICD-10-CM | POA: Diagnosis not present

## 2015-07-24 MED ORDER — DICLOFENAC SODIUM 75 MG PO TBEC: 75.0000 mg | DELAYED_RELEASE_TABLET | Freq: Two times a day (BID) | ORAL | Status: DC

## 2015-07-26 NOTE — Progress Notes (Signed)
Subjective:     Patient ID: [name], female   DOB: [birth year], 45 y.o.   MRN: [id number]  HPI patient states I was doing well with my right foot but yesterday I turned my foot and it felt very painful and I'm having trouble bearing weight on   Review of Systems     Objective:   Physical Exam Neurovascular status intact muscle strength adequate with exquisite discomfort in the lateral side of the right foot in the area of the insertion of the peroneal tendon into the base of the fifth metatarsal    Assessment:     Possibility for fracture the base of fifth metatarsal or Jones fracture versus tendinitis    Plan:     H&P condition reviewed and at this time I did dispense air fracture walker with instructions on usage. Patient will apply ice aggressively and will be seen back again in 3 weeks  X-ray report indicates possible localized fracture to the area but nothing that is displaced at the current time

## 2015-08-14 ENCOUNTER — Encounter: Payer: Self-pay | Admitting: Podiatry

## 2015-08-14 ENCOUNTER — Ambulatory Visit (INDEPENDENT_AMBULATORY_CARE_PROVIDER_SITE_OTHER): Payer: 59 | Admitting: Podiatry

## 2015-08-14 ENCOUNTER — Ambulatory Visit (INDEPENDENT_AMBULATORY_CARE_PROVIDER_SITE_OTHER): Payer: 59

## 2015-08-14 VITALS — BP 102/61 | HR 88 | Resp 16

## 2015-08-14 DIAGNOSIS — M79671 Pain in right foot: Secondary | ICD-10-CM

## 2015-08-14 DIAGNOSIS — S92301A Fracture of unspecified metatarsal bone(s), right foot, initial encounter for closed fracture: Secondary | ICD-10-CM | POA: Diagnosis not present

## 2015-08-14 DIAGNOSIS — M779 Enthesopathy, unspecified: Secondary | ICD-10-CM | POA: Diagnosis not present

## 2015-08-14 NOTE — Progress Notes (Signed)
Subjective:     Patient ID: [name], female   DOB: [birth year], 45 y.o.   MRN: [id number]  HPI patient states I'm feeling quite a bit better with my right foot with discomfort only if I been active and I was able to get the boot out early this week   Review of Systems     Objective:   Physical Exam Neurovascular status intact muscle strength adequate range of motion within normal limits with patient found to have mild discomfort lateral side right foot around the insertion of peroneal tendon base of fifth metatarsal with no indications of muscle strength loss    Assessment:     Improving tendinitis right with possibility for fracture of a close nature right    Plan:     X-ray reviewed and at this time a repeat resume normal activity with continued boot usage on a part-time basis. Reappoint to recheck  X-ray report indicated minimal changes base of fifth metatarsal with possibility for fracture noted but nothing of a significant nature with no indications of pathology

## 2015-09-04 ENCOUNTER — Ambulatory Visit: Payer: 59

## 2015-09-04 ENCOUNTER — Encounter: Payer: Self-pay | Admitting: Podiatry

## 2015-09-04 ENCOUNTER — Ambulatory Visit (INDEPENDENT_AMBULATORY_CARE_PROVIDER_SITE_OTHER): Payer: 59 | Admitting: Podiatry

## 2015-09-04 VITALS — BP 107/65 | HR 77 | Resp 16

## 2015-09-04 DIAGNOSIS — M79671 Pain in right foot: Secondary | ICD-10-CM | POA: Diagnosis not present

## 2015-09-04 DIAGNOSIS — M779 Enthesopathy, unspecified: Secondary | ICD-10-CM | POA: Diagnosis not present

## 2015-09-04 MED ORDER — TRAMADOL HCL 50 MG PO TABS: 50.0000 mg | ORAL_TABLET | Freq: Three times a day (TID) | ORAL | Status: DC

## 2015-09-04 MED ORDER — TRIAMCINOLONE ACETONIDE 10 MG/ML IJ SUSP
10.0000 mg | Freq: Once | INTRAMUSCULAR | Status: AC
  Administered 2015-09-04: 10 mg

## 2015-09-06 NOTE — Progress Notes (Signed)
Subjective:     Patient ID: [name], female   DOB: 1971-05-25, 45 y.o.   MRN: [id number]  HPI patient presents with continued pain in the outside of the right foot and states that she is going to [employer] in the next 4 weeks and is concerned about the trip   Review of Systems     Objective:   Physical Exam Neurovascular status intact negative Homans sign noted with continued irritation of the peroneal tendon as it inserts into the base of the fifth metatarsal right with fluid buildup but no indications of structural malalignment.    Assessment:     Inflammatory process of the right peroneal tendon at its insertion into the base of the fifth metatarsal with possibility still of fracture or strictly inflammatory condition    Plan:     X-rays taken reviewed and today I did a careful sheath injection right 3 mg Kenalog 5 mg Xylocaine and advised on boot usage and also we scanned for new pair of orthotics to lift the lateral side of the foot. Patient will be seen back to recheck when ready  X-rays indicated no indication of a displaced type fracture with the possibility of a very small fracture at the insertion of peroneal into base of fifth metatarsal

## 2015-09-22 ENCOUNTER — Ambulatory Visit: Payer: 59 | Admitting: *Deleted

## 2015-09-22 DIAGNOSIS — M779 Enthesopathy, unspecified: Secondary | ICD-10-CM

## 2015-09-22 NOTE — Patient Instructions (Signed)

## 2015-09-22 NOTE — Progress Notes (Signed)
Patient ID: [name], female   DOB: [birth year], 45 y.o.   MRN: [id number] Patient presents for orthotic pick up.  They are cutting into the skin on the inside of the foot will return to have adjusted.

## 2015-10-02 ENCOUNTER — Ambulatory Visit (INDEPENDENT_AMBULATORY_CARE_PROVIDER_SITE_OTHER): Payer: 59 | Admitting: Podiatry

## 2015-10-02 ENCOUNTER — Encounter: Payer: Self-pay | Admitting: Podiatry

## 2015-10-02 DIAGNOSIS — M779 Enthesopathy, unspecified: Secondary | ICD-10-CM | POA: Diagnosis not present

## 2015-10-02 MED ORDER — TRIAMCINOLONE ACETONIDE 10 MG/ML IJ SUSP
10.0000 mg | Freq: Once | INTRAMUSCULAR | Status: AC
  Administered 2015-10-02: 10 mg

## 2015-10-02 NOTE — Progress Notes (Signed)
Subjective:     Patient ID: [name], female   DOB: Jul 29, 1970, 45 y.o.   MRN: [id number]  HPI patient states it's hurting on the outside this right foot but improved from previously but I am going to [employer] and I'm due to be walking a lot   Review of Systems     Objective:   Physical Exam Neurovascular status intact muscle strength adequate with diminished edema in the right lateral foot    Assessment:     Tendinitis present but with mild improvement of the right    Plan:     H&P and went ahead today and did careful injection of the tendon complex 3 mg Kenalog 5 mg Xylocaine advised on ice therapy possible boot usage and continued orthotic usage. Reappoint to recheck as symptoms indicate

## 2015-11-06 ENCOUNTER — Other Ambulatory Visit: Payer: Self-pay | Admitting: Podiatry

## 2015-11-06 NOTE — Telephone Encounter (Signed)
Pt needs an appt if continuing to have problems.

## 2015-11-27 DIAGNOSIS — M25561 Pain in right knee: Secondary | ICD-10-CM | POA: Diagnosis not present

## 2015-11-30 ENCOUNTER — Other Ambulatory Visit: Payer: Self-pay | Admitting: Podiatry

## 2015-12-31 DIAGNOSIS — R635 Abnormal weight gain: Secondary | ICD-10-CM | POA: Diagnosis not present

## 2016-01-07 ENCOUNTER — Other Ambulatory Visit: Payer: Self-pay | Admitting: Podiatry

## 2016-01-07 NOTE — Telephone Encounter (Signed)
Pt needs an appt prior to future refills.

## 2016-02-01 ENCOUNTER — Other Ambulatory Visit: Payer: Self-pay | Admitting: Cardiovascular Disease

## 2016-02-15 ENCOUNTER — Emergency Department (HOSPITAL_BASED_OUTPATIENT_CLINIC_OR_DEPARTMENT_OTHER): Payer: 59

## 2016-02-15 ENCOUNTER — Emergency Department (HOSPITAL_BASED_OUTPATIENT_CLINIC_OR_DEPARTMENT_OTHER)
Admission: EM | Admit: 2016-02-15 | Discharge: 2016-02-15 | Disposition: A | Discharge status: Home / Self Care | Payer: 59 | Attending: Emergency Medicine | Admitting: Emergency Medicine

## 2016-02-15 ENCOUNTER — Encounter (HOSPITAL_BASED_OUTPATIENT_CLINIC_OR_DEPARTMENT_OTHER): Payer: Self-pay | Admitting: Emergency Medicine

## 2016-02-15 DIAGNOSIS — Z87891 Personal history of nicotine dependence: Secondary | ICD-10-CM | POA: Diagnosis not present

## 2016-02-15 DIAGNOSIS — I509 Heart failure, unspecified: Secondary | ICD-10-CM | POA: Insufficient documentation

## 2016-02-15 DIAGNOSIS — M545 Low back pain, unspecified: Secondary | ICD-10-CM

## 2016-02-15 DIAGNOSIS — Z79899 Other long term (current) drug therapy: Secondary | ICD-10-CM | POA: Diagnosis not present

## 2016-02-15 DIAGNOSIS — I110 Hypertensive heart disease with heart failure: Secondary | ICD-10-CM | POA: Insufficient documentation

## 2016-02-15 HISTORY — DX: Other chronic pain: G89.29

## 2016-02-15 HISTORY — DX: Chronic pain syndrome: G89.4

## 2016-02-15 HISTORY — DX: Low back pain, unspecified: M54.50

## 2016-02-15 HISTORY — DX: Presence of urogenital implants: Z96.0

## 2016-02-15 HISTORY — DX: Low back pain: M54.5

## 2016-02-15 LAB — PREGNANCY, URINE: PREG TEST UR: NEGATIVE

## 2016-02-15 MED ORDER — DIAZEPAM 5 MG/ML IJ SOLN
5.0000 mg | Freq: Once | INTRAMUSCULAR | Status: AC
  Administered 2016-02-15: 5 mg via INTRAVENOUS
  Filled 2016-02-15: qty 2

## 2016-02-15 MED ORDER — METHYLPREDNISOLONE 4 MG PO TBPK: ORAL_TABLET | ORAL | 0 refills | Status: DC

## 2016-02-15 MED ORDER — KETOROLAC TROMETHAMINE 15 MG/ML IJ SOLN
15.0000 mg | Freq: Once | INTRAMUSCULAR | Status: AC
  Administered 2016-02-15: 15 mg via INTRAVENOUS
  Filled 2016-02-15: qty 1

## 2016-02-15 MED ORDER — METHOCARBAMOL 500 MG PO TABS: 500.0000 mg | ORAL_TABLET | Freq: Two times a day (BID) | ORAL | 0 refills | Status: DC

## 2016-02-15 MED ORDER — HYDROMORPHONE HCL 1 MG/ML IJ SOLN
1.0000 mg | Freq: Once | INTRAMUSCULAR | Status: AC
  Administered 2016-02-15: 1 mg via INTRAVENOUS
  Filled 2016-02-15: qty 1

## 2016-02-15 MED ORDER — ONDANSETRON HCL 4 MG/2ML IJ SOLN
4.0000 mg | Freq: Once | INTRAMUSCULAR | Status: AC
Start: 2016-02-15 — End: 2016-02-15
  Administered 2016-02-15: 4 mg via INTRAVENOUS
  Filled 2016-02-15: qty 2

## 2016-02-15 MED ORDER — METHOCARBAMOL 500 MG PO TABS
1000.0000 mg | ORAL_TABLET | Freq: Once | ORAL | Status: AC
  Administered 2016-02-15: 1000 mg via ORAL
  Filled 2016-02-15: qty 2

## 2016-02-15 NOTE — Discharge Instructions (Addendum)
Please read and follow all provided instructions.  Your diagnoses today include:  1. Bilateral low back pain without sciatica    Tests performed today include: Vital signs - see below for your results today  Medications prescribed:   Take any prescribed medications only as directed.  Home care instructions:  Follow any educational materials contained in this packet Please rest, use ice or heat on your back for the next several days Do not lift, push, pull anything more than 10 pounds for the next week  Follow-up instructions: Please follow-up with your primary care provider in the next 1 week for further evaluation of your symptoms.   Return instructions:  SEEK IMMEDIATE MEDICAL ATTENTION IF YOU HAVE: New numbness, tingling, weakness, or problem with the use of your arms or legs Severe back pain not relieved with medications Loss control of your bowels or bladder Increasing pain in any areas of the body (such as chest or abdominal pain) Shortness of breath, dizziness, or fainting.  Worsening nausea (feeling sick to your stomach), vomiting, fever, or sweats Any other emergent concerns regarding your health   Additional Information:  Your vital signs today were: BP 129/73 (BP Location: Left Arm)    Pulse 87    Temp 98.7 F (37.1 C) (Oral)    Resp 20    Ht 5' 2"  (1.575 m)    Wt 90.7 kg    LMP 02/15/2016    SpO2 96%    BMI 36.58 kg/m  If your blood pressure (BP) was elevated above 135/85 this visit, please have this repeated by your doctor within one month. --------------

## 2016-02-15 NOTE — ED Provider Notes (Signed)
  Physical Exam  BP 133/70 (BP Location: Right Wrist)   Pulse 86   Temp 98.7 F (37.1 C) (Oral)   Resp 22   Ht 5' 2"  (1.575 m)   Wt 90.7 kg   LMP 02/15/2016   SpO2 98%   BMI 36.58 kg/m   Physical Exam  Constitutional: She is oriented to person, place, and time. Vital signs are normal. She appears well-developed and well-nourished.  HENT:  Head: Normocephalic.  Right Ear: Hearing normal.  Left Ear: Hearing normal.  Eyes: Conjunctivae and EOM are normal. Pupils are equal, round, and reactive to light.  Cardiovascular: Normal rate, regular rhythm, normal heart sounds and intact distal pulses.   Pulmonary/Chest: Effort normal and breath sounds normal.  Musculoskeletal:       Lumbar back: She exhibits decreased range of motion and tenderness. She exhibits no deformity.  Pt able to ambulate. TTP on bilateral lower lumbar musculature   Neurological: She is alert and oriented to person, place, and time. She has normal strength and normal reflexes. No sensory deficit. She displays no Babinski's sign on the right side. She displays no Babinski's sign on the left side.  Reflex Scores:      Patellar reflexes are 2+ on the right side and 2+ on the left side.      Achilles reflexes are 2+ on the right side and 2+ on the left side. Skin: Skin is warm and dry.  Psychiatric: She has a normal mood and affect. Her speech is normal and behavior is normal. Thought content normal.  Nursing note and vitals reviewed.  ED Course  Procedures  MDM  Sign out from [facility], PA-C [name] is a 45 y.o. female with PMH significant for bipolar disorder, interstitial cystitis, fibromyalgia, migraines, IBS, low back pain who presents with gradual onset, constant, severe, non-radiating low back pain.  Patient states she bent over last night and began experiencing low back pain that progressively became worse.  She states she is unable to ambulate due to pain.  Associated symptoms include tingling in  LLE.  No fever, chills, b/b incontinence, saddle anesthesia, abdominal pain, or urinary symptoms.  She states she took vicodin, toradol, and morphine with minimal relief.  Denies IVDU.  Given Analgesia in ED  CT Lumbar ordered due to patient concern and increase pain on exam, which showed small central disc protrusion along L5-S1.  IMPRESSION: 1. Normal alignment of the lumbar vertebral bodies and no acute bony findings or bone lesions. 2. The facets are normally aligned. No pars defects or degenerative changes. 3. Small central disc protrusion at L5-S1 with potential irritation of the S1 nerve roots. 4. Multiple bilateral renal calculi.  Pt able to ambulate in ED. No gross neurological deficits appreciated. Motor/sensation intact with reflex intact. Pain managed in ED.  Will Rx Muscle relaxants and Medrol dose pack with follow up to PCP. Patient is in no acute distress. Vital Signs are stable. Patient is able to ambulate. Patient able to tolerate PO.      Shary Decamp, PA-C 02/16/16 0011    Forde Dandy, MD 02/16/16 1034
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
## 2016-02-15 NOTE — ED Triage Notes (Signed)
Patient states that she started to have pain to her lower back with radiation down her left leg into her foot since last night

## 2016-03-07 DIAGNOSIS — M549 Dorsalgia, unspecified: Secondary | ICD-10-CM | POA: Diagnosis not present

## 2016-03-07 DIAGNOSIS — Z6837 Body mass index (BMI) 37.0-37.9, adult: Secondary | ICD-10-CM | POA: Diagnosis not present

## 2016-03-07 DIAGNOSIS — M5416 Radiculopathy, lumbar region: Secondary | ICD-10-CM | POA: Insufficient documentation

## 2016-03-18 ENCOUNTER — Other Ambulatory Visit: Payer: Self-pay | Admitting: Neurosurgery

## 2016-03-18 DIAGNOSIS — M5416 Radiculopathy, lumbar region: Secondary | ICD-10-CM

## 2016-04-01 ENCOUNTER — Ambulatory Visit
Admission: RE | Admit: 2016-04-01 | Discharge: 2016-04-01 | Disposition: A | Discharge status: Home / Self Care | Payer: 59 | Source: Ambulatory Visit | Attending: Neurosurgery | Admitting: Neurosurgery

## 2016-04-01 DIAGNOSIS — M5416 Radiculopathy, lumbar region: Secondary | ICD-10-CM

## 2016-04-01 DIAGNOSIS — M5126 Other intervertebral disc displacement, lumbar region: Secondary | ICD-10-CM | POA: Diagnosis not present

## 2016-04-01 MED ORDER — OXYCODONE-ACETAMINOPHEN 5-325 MG PO TABS
1.0000 | ORAL_TABLET | Freq: Once | ORAL | Status: AC
  Administered 2016-04-01: 1 via ORAL

## 2016-04-01 MED ORDER — DIAZEPAM 5 MG PO TABS
5.0000 mg | ORAL_TABLET | Freq: Once | ORAL | Status: AC
  Administered 2016-04-01: 5 mg via ORAL

## 2016-04-01 MED ORDER — MEPERIDINE HCL 100 MG/ML IJ SOLN
75.0000 mg | Freq: Once | INTRAMUSCULAR | Status: AC
  Administered 2016-04-01: 75 mg via INTRAMUSCULAR

## 2016-04-01 MED ORDER — ONDANSETRON HCL 4 MG/2ML IJ SOLN
4.0000 mg | Freq: Once | INTRAMUSCULAR | Status: AC
  Administered 2016-04-01: 4 mg via INTRAMUSCULAR

## 2016-04-01 MED ORDER — IOPAMIDOL (ISOVUE-M 200) INJECTION 41%
15.0000 mL | Freq: Once | INTRAMUSCULAR | Status: AC
  Administered 2016-04-01: 15 mL via INTRATHECAL

## 2016-04-01 NOTE — Progress Notes (Signed)
Pt states she has been off Trazodone and Effexor for the past 2 days.

## 2016-04-01 NOTE — Discharge Instructions (Signed)
Myelogram Discharge Instructions  1. Go home and rest quietly for the next 24 hours.  It is important to lie flat for the next 24 hours.  Get up only to go to the restroom.  You may lie in the bed or on a couch on your back, your stomach, your left side or your right side.  You may have one pillow under your head.  You may have pillows between your knees while you are on your side or under your knees while you are on your back.  2. DO NOT drive today.  Recline the seat as far back as it will go, while still wearing your seat belt, on the way home.  3. You may get up to go to the bathroom as needed.  You may sit up for 10 minutes to eat.  You may resume your normal diet and medications unless otherwise indicated.  Drink lots of extra fluids today and tomorrow.  4. The incidence of headache, nausea, or vomiting is about 5% (one in 20 patients).  If you develop a headache, lie flat and drink plenty of fluids until the headache goes away.  Caffeinated beverages may be helpful.  If you develop severe nausea and vomiting or a headache that does not go away with flat bed rest, call [phone].  5. You may resume normal activities after your 24 hours of bed rest is over; however, do not exert yourself strongly or do any heavy lifting tomorrow. If when you get up you have a headache when standing, go back to bed and force fluids for another 24 hours.  6. Call your physician for a follow-up appointment.  The results of your myelogram will be sent directly to your physician by the following day.  7. If you have any questions or if complications develop after you arrive home, please call [phone].  Discharge instructions have been explained to the patient.  The patient, or the person responsible for the patient, fully understands these instructions.        May resume Trazodone and Effexor on Nov. 4, 2017, after 9:30 am.

## 2016-04-04 ENCOUNTER — Telehealth: Payer: Self-pay

## 2016-04-04 NOTE — Telephone Encounter (Signed)
Spoke with patient after she had a myelogram here 04/01/16.  She said she made it through the 24 hour of bedrest "finally" and denies any headache.  jkl

## 2016-04-08 ENCOUNTER — Telehealth: Payer: Self-pay | Admitting: Cardiovascular Disease

## 2016-04-08 NOTE — Telephone Encounter (Signed)
These were sent in on 02/02/16 for #90. Patient has an appointment on 05/03/16. I have spoke with patient and made her aware that she should have enough to last until she comes in and that they can be refilled for the year at that visit.

## 2016-04-08 NOTE — Telephone Encounter (Signed)
New message  Diltiazem 21m 1xday 90 days  Lisinopril 167m1xday 90 days  [facility]

## 2016-04-14 ENCOUNTER — Ambulatory Visit (INDEPENDENT_AMBULATORY_CARE_PROVIDER_SITE_OTHER): Payer: 59 | Admitting: Podiatry

## 2016-04-14 DIAGNOSIS — M722 Plantar fascial fibromatosis: Secondary | ICD-10-CM | POA: Diagnosis not present

## 2016-04-14 MED ORDER — TRIAMCINOLONE ACETONIDE 10 MG/ML IJ SUSP
10.0000 mg | Freq: Once | INTRAMUSCULAR | Status: AC
  Administered 2016-04-14: 10 mg

## 2016-04-14 NOTE — Progress Notes (Signed)
Subjective:     Patient ID: [name], female   DOB: Oct 13, 1970, 45 y.o.   MRN: [id number]  HPI patient states she's getting pain in both of her heels and she also is having a lot of back problems and is just had a myelogram performed   Review of Systems     Objective:   Physical Exam Neurovascular status intact with acute inflammation plantar heel right over left with pain worse after periods of sitting and when getting up in the morning    Assessment:     Inflammatory fasciitis bilateral with also significant back problems    Plan:     H&P conditions reviewed injected the plantar fascial bilateral 3 mg Kenalog 5 g Xylocaine and dispensed night splint right. I also discussed other possibilities in future but we'll want to see first how her back is doing and she'll be seen back to recheck  X-rays indicated spur formation with no indication of other pathology

## 2016-04-26 ENCOUNTER — Encounter: Payer: Self-pay | Admitting: Cardiovascular Disease

## 2016-04-29 ENCOUNTER — Telehealth: Payer: Self-pay | Admitting: Cardiovascular Disease

## 2016-04-29 NOTE — Telephone Encounter (Signed)
Noted  

## 2016-04-29 NOTE — Telephone Encounter (Signed)
New message    Pt verbalized that she wants to have labs done after her appt with Dr.Nasdher

## 2016-04-30 ENCOUNTER — Other Ambulatory Visit: Payer: Self-pay | Admitting: Cardiovascular Disease

## 2016-05-02 ENCOUNTER — Encounter: Payer: Self-pay | Admitting: Podiatry

## 2016-05-02 ENCOUNTER — Ambulatory Visit (INDEPENDENT_AMBULATORY_CARE_PROVIDER_SITE_OTHER): Payer: 59 | Admitting: Podiatry

## 2016-05-02 VITALS — BP 107/68 | HR 96 | Resp 16

## 2016-05-02 DIAGNOSIS — M722 Plantar fascial fibromatosis: Secondary | ICD-10-CM | POA: Diagnosis not present

## 2016-05-02 MED ORDER — TRIAMCINOLONE ACETONIDE 10 MG/ML IJ SUSP
10.0000 mg | Freq: Once | INTRAMUSCULAR | Status: AC
  Administered 2016-05-02: 10 mg

## 2016-05-03 ENCOUNTER — Ambulatory Visit (INDEPENDENT_AMBULATORY_CARE_PROVIDER_SITE_OTHER): Payer: 59 | Admitting: Cardiovascular Disease

## 2016-05-03 ENCOUNTER — Encounter: Payer: Self-pay | Admitting: Cardiovascular Disease

## 2016-05-03 VITALS — BP 124/76 | HR 78 | Ht 62.0 in | Wt 200.8 lb

## 2016-05-03 DIAGNOSIS — E7800 Pure hypercholesterolemia, unspecified: Secondary | ICD-10-CM | POA: Diagnosis not present

## 2016-05-03 DIAGNOSIS — I10 Essential (primary) hypertension: Secondary | ICD-10-CM

## 2016-05-03 DIAGNOSIS — Z79899 Other long term (current) drug therapy: Secondary | ICD-10-CM

## 2016-05-03 LAB — COMPREHENSIVE METABOLIC PANEL
ALBUMIN: 4.4 g/dL (ref 3.6–5.1)
ALK PHOS: 82 U/L (ref 33–115)
ALT: 23 U/L (ref 6–29)
AST: 18 U/L (ref 10–30)
BILIRUBIN TOTAL: 0.6 mg/dL (ref 0.2–1.2)
BUN: 17 mg/dL (ref 7–25)
CALCIUM: 9.5 mg/dL (ref 8.6–10.2)
CO2: 24 mmol/L (ref 20–31)
CREATININE: 0.73 mg/dL (ref 0.50–1.10)
Chloride: 104 mmol/L (ref 98–110)
Glucose, Bld: 102 mg/dL — ABNORMAL HIGH (ref 65–99)
Potassium: 3.9 mmol/L (ref 3.5–5.3)
SODIUM: 137 mmol/L (ref 135–146)
TOTAL PROTEIN: 6.8 g/dL (ref 6.1–8.1)

## 2016-05-03 LAB — LIPID PANEL
CHOLESTEROL: 209 mg/dL — AB (ref <200)
HDL: 64 mg/dL (ref >50)
LDL Cholesterol: 131 mg/dL — ABNORMAL HIGH (ref <100)
TRIGLYCERIDES: 72 mg/dL (ref <150)
Total CHOL/HDL Ratio: 3.3 Ratio (ref <5.0)
VLDL: 14 mg/dL (ref <30)

## 2016-05-03 MED ORDER — DILTIAZEM HCL ER COATED BEADS 240 MG PO CP24: 240.0000 mg | ORAL_CAPSULE | Freq: Every day | ORAL | 3 refills | Status: DC

## 2016-05-03 MED ORDER — LISINOPRIL 10 MG PO TABS: 10.0000 mg | ORAL_TABLET | Freq: Every day | ORAL | 3 refills | Status: DC

## 2016-05-03 NOTE — Patient Instructions (Addendum)
Dr. Olevia Bowens. Tamala Julian, [facility] physician in [place], [place]  Address: [address] [place], [place], [place] [address]  Phone: [phone]   Medication Instructions:    Your physician recommends that you continue on your current medications as directed. Please refer to the Current Medication list given to you today.  --- If you need a refill on your cardiac medications before your next appointment, please call your pharmacy. ---  Labwork:  Today: CMET & Lipid profile  Testing/Procedures:  None ordered  Follow-Up:  Your physician wants you to follow-up in: 1 year with Dr. Acie Fredrickson.  You will receive a reminder letter in the mail two months in advance. If you don't receive a letter, please call our office to schedule the follow-up appointment.   Thank you for choosing [facility]!!
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
## 2016-05-04 NOTE — Addendum Note (Signed)
Addended by: Emmaline Life on: 05/04/2016 01:10 PM   Modules accepted: Orders

## 2016-05-04 NOTE — Progress Notes (Signed)
Subjective:     Patient ID: [name], female   DOB: [birth year], 45 y.o.   MRN: [id number]  HPI patient presents stating that her heel has started to hurt her again and she had improvement but it doesn't hurt in the same spot but it's more on the bottom and outside of the heel   Review of Systems     Objective:   Physical Exam Neurovascular status intact with inflammation still noted heel but moved from the medial to the lateral side    Assessment:     Inflammatory fasciitis right in a separate position    Plan:     Injected the right plantar fascia 3 mg Kenalog 5 mill grams like him from a lateral position and discussed that if this does not improve we'll need to consider other treatments including either shockwave or possible surgery

## 2016-05-05 DIAGNOSIS — M461 Sacroiliitis, not elsewhere classified: Secondary | ICD-10-CM | POA: Insufficient documentation

## 2016-05-05 DIAGNOSIS — F1120 Opioid dependence, uncomplicated: Secondary | ICD-10-CM | POA: Insufficient documentation

## 2016-05-05 DIAGNOSIS — M47816 Spondylosis without myelopathy or radiculopathy, lumbar region: Secondary | ICD-10-CM | POA: Insufficient documentation

## 2016-05-05 DIAGNOSIS — G4700 Insomnia, unspecified: Secondary | ICD-10-CM | POA: Insufficient documentation

## 2016-05-18 ENCOUNTER — Other Ambulatory Visit: Payer: Self-pay | Admitting: Cardiovascular Disease

## 2016-05-18 NOTE — Telephone Encounter (Signed)
lisinopril (PRINIVIL,ZESTRIL) 10 MG tablet  Medication  Date: 05/03/2016 Department: [address] Office Ordering/Authorizing: Thayer Headings, MD  Order Providers   Prescribing Provider Encounter Provider  Thayer Headings, MD Thayer Headings, MD  Medication Detail    Disp Refills Start End   lisinopril (PRINIVIL,ZESTRIL) 10 MG tablet 90 tablet 3 05/03/2016    Sig - Route: Take 1 tablet (10 mg total) by mouth daily. - Oral   E-Prescribing Status: Receipt confirmed by pharmacy (05/03/2016 8:45 AM EST)   Pharmacy   [id number] [place], [place]

## 2016-05-19 DIAGNOSIS — M797 Fibromyalgia: Secondary | ICD-10-CM | POA: Diagnosis not present

## 2016-05-19 DIAGNOSIS — R350 Frequency of micturition: Secondary | ICD-10-CM | POA: Diagnosis not present

## 2016-05-19 DIAGNOSIS — N3010 Interstitial cystitis (chronic) without hematuria: Secondary | ICD-10-CM | POA: Diagnosis not present

## 2016-05-19 DIAGNOSIS — R102 Pelvic and perineal pain: Secondary | ICD-10-CM | POA: Diagnosis not present

## 2016-05-19 DIAGNOSIS — G894 Chronic pain syndrome: Secondary | ICD-10-CM | POA: Diagnosis not present

## 2016-05-19 DIAGNOSIS — K582 Mixed irritable bowel syndrome: Secondary | ICD-10-CM | POA: Diagnosis not present

## 2016-05-19 DIAGNOSIS — N9489 Other specified conditions associated with female genital organs and menstrual cycle: Secondary | ICD-10-CM | POA: Diagnosis not present

## 2016-05-25 ENCOUNTER — Ambulatory Visit: Payer: 59 | Admitting: Podiatry

## 2016-05-26 ENCOUNTER — Ambulatory Visit (INDEPENDENT_AMBULATORY_CARE_PROVIDER_SITE_OTHER): Payer: 59 | Admitting: Podiatry

## 2016-05-26 ENCOUNTER — Encounter: Payer: Self-pay | Admitting: Podiatry

## 2016-05-26 VITALS — BP 113/74 | HR 76 | Resp 16 | Ht 62.0 in | Wt 200.0 lb

## 2016-05-26 DIAGNOSIS — M722 Plantar fascial fibromatosis: Secondary | ICD-10-CM

## 2016-05-26 NOTE — Patient Instructions (Signed)
Pre-Operative Instructions  Congratulations, you have decided to take an important step to improving your quality of life.  You can be assured that the doctors of [facility] will be with you every step of the way.  1. Plan to be at the surgery center/hospital at least 1 (one) hour prior to your scheduled time unless otherwise directed by the surgical center/hospital staff.  You must have a responsible adult accompany you, remain during the surgery and drive you home.  Make sure you have directions to the surgical center/hospital and know how to get there on time. 2. For hospital based surgery you will need to obtain a history and physical form from your family physician within 1 month prior to the date of surgery- we will give you a form for you primary physician.  3. We make every effort to accommodate the date you request for surgery.  There are however, times where surgery dates or times have to be moved.  We will contact you as soon as possible if a change in schedule is required.   4. No Aspirin/Ibuprofen for one week before surgery.  If you are on aspirin, any non-steroidal anti-inflammatory medications (Mobic, Aleve, Ibuprofen) you should stop taking it 7 days prior to your surgery.  You make take Tylenol  For pain prior to surgery.  5. Medications- If you are taking daily heart and blood pressure medications, seizure, reflux, allergy, asthma, anxiety, pain or diabetes medications, make sure the surgery center/hospital is aware before the day of surgery so they may notify you which medications to take or avoid the day of surgery. 6. No food or drink after midnight the night before surgery unless directed otherwise by surgical center/hospital staff. 7. No alcoholic beverages 24 hours prior to surgery.  No smoking 24 hours prior to or 24 hours after surgery. 8. Wear loose pants or shorts- loose enough to fit over bandages, boots, and casts. 9. No slip on shoes, sneakers are best. 10. Bring  your boot with you to the surgery center/hospital.  Also bring crutches or a walker if your physician has prescribed it for you.  If you do not have this equipment, it will be provided for you after surgery. 11. If you have not been contracted by the surgery center/hospital by the day before your surgery, call to confirm the date and time of your surgery. 12. Leave-time from work may vary depending on the type of surgery you have.  Appropriate arrangements should be made prior to surgery with your employer. 13. Prescriptions will be provided immediately following surgery by your doctor.  Have these filled as soon as possible after surgery and take the medication as directed. 14. Remove nail polish on the operative foot. 15. Wash the night before surgery.  The night before surgery wash the foot and leg well with the antibacterial soap provided and water paying special attention to beneath the toenails and in between the toes.  Rinse thoroughly with water and dry well with a towel.  Perform this wash unless told not to do so by your physician.  Enclosed: 1 Ice pack (please put in freezer the night before surgery)   1 Hibiclens skin cleaner   Pre-op Instructions  If you have any questions regarding the instructions, do not hesitate to call our office.  [place]: [address] [place], [place] [address] [phone]  [place]: [address] [place], [place] [address] [phone]  [place]: [address]  [place], [place] [address] [phone]   Dr.   Norman Regal DPM, Dr. Matthew Wagoner DPM, Dr. M. Todd Hyatt DPM, Dr. Titorya Stover DPM 

## 2016-05-26 NOTE — Progress Notes (Signed)
Subjective:     Patient ID: [name], female   DOB: 1970-10-20, 45 y.o.   MRN: [id number]  HPI patient states the inside of her heel has really been bothering her and she's held off but she knows that she needs to get this fixed as she cannot be active   Review of Systems     Objective:   Physical Exam Neurovascular status intact with exquisite discomfort plantar aspect right heel at the insertional point tendon into the calcaneus with inflammation and fluid around the medial band    Assessment:     Plantar fasciitis right with inflammation fluid of the medial band    Plan:     H&P condition reviewed and recommended surgical intervention. Explained procedure and allow patient to read consent form reviewing all possible complications risk and I did explain with this procedure chances for arch pain or other pains can occur secondary to change in gait pattern. She understands total recovery can take 6 months to one year and at this time she signs consent form after extensive review and is dispensed short air fracture walker. Patient scheduled for surgery in is encouraged to call with any questions prior to procedure

## 2016-06-06 ENCOUNTER — Telehealth: Payer: Self-pay | Admitting: *Deleted

## 2016-06-06 NOTE — Telephone Encounter (Addendum)
Pt states she has surgery scheduled tomorrow with Dr. Paulla Dolly and has some questions. Pt asked if she would be using crutches. I told her with the plantar fasciitis surgery, she would be able to walk on the foot, but may want to use the crutches to balance to get off the surgery foot quicker when stepping. I told pt she would be able to be up on the foot 15 minutes/hour and would be in the surgery boot at all times. Pt states understanding. 06/08/2016-Pt states started throwing up about 1:00am and is continuing to vomit. Pt requested phenergan suppositories. I told pt I would ask Dr. Paulla Dolly and asked pt if there was a pain medication that did not cause nausea and vomiting. Pt states she had some percocet that didn't bother her. Pt states she has some Percocet she can take until someone can pick up rx in the [place] office.  Dr. Josephina Shih Phenergan suppositories 93m #30 one suppository as directed every 6 hours prn nausea or vomiting, and percocet 5/3274m#30 one tablet every 4-6 hours prn foot pain. Orders called to pt. 08/22/2016-Pt states she has questions about her last shot in her left foot. Pt states the injection in her left heel only lasted one day and she would like to know if she could get another or if she needed to sign up for surgery. I told pt to push ice therapy 3-4 times daily for 15-20 minutes each session and protect the skin from the ice with cloth and schedule an appt for 2 weeks from the last for possible reinjection and if necessary to discuss surgery. Pt states understanding.

## 2016-06-07 ENCOUNTER — Encounter: Payer: Self-pay | Admitting: Podiatry

## 2016-06-07 DIAGNOSIS — M722 Plantar fascial fibromatosis: Secondary | ICD-10-CM | POA: Diagnosis not present

## 2016-06-08 MED ORDER — PROMETHAZINE HCL 25 MG RE SUPP: 25.0000 mg | Freq: Four times a day (QID) | RECTAL | 0 refills | Status: DC | PRN

## 2016-06-08 MED ORDER — OXYCODONE-ACETAMINOPHEN 5-325 MG PO TABS: ORAL_TABLET | ORAL | 0 refills | Status: DC

## 2016-06-09 NOTE — Progress Notes (Signed)
DOS 01.09.2018 Endoscopic Release Med. Band Right Heel

## 2016-06-15 ENCOUNTER — Other Ambulatory Visit: Payer: 59

## 2016-06-17 ENCOUNTER — Ambulatory Visit (INDEPENDENT_AMBULATORY_CARE_PROVIDER_SITE_OTHER): Payer: 59 | Admitting: Podiatry

## 2016-06-17 DIAGNOSIS — M722 Plantar fascial fibromatosis: Secondary | ICD-10-CM | POA: Diagnosis not present

## 2016-06-19 NOTE — Progress Notes (Signed)
Subjective:     Patient ID: [name], female   DOB: [birth year], 46 y.o.   MRN: [id number]  HPI patient presents stating she's doing really well with surgery   Review of Systems     Objective:   Physical Exam Neurovascular status intact with wound edges well coapted medial lateral right heel with significant reduction of plantar pain and negative Homans sign    Assessment:     Doing well post endoscopic surgery right    Plan:     Stitches removed wound edges coapted well and applied sterile dressing. Gave instructions on physical therapy and stretching exercises and continued boot usage and reappoint in 2 weeks

## 2016-06-28 ENCOUNTER — Encounter: Payer: Self-pay | Admitting: Podiatry

## 2016-06-29 ENCOUNTER — Encounter: Payer: 59 | Admitting: Podiatry

## 2016-06-29 ENCOUNTER — Ambulatory Visit (INDEPENDENT_AMBULATORY_CARE_PROVIDER_SITE_OTHER): Payer: 59 | Admitting: Podiatry

## 2016-06-29 DIAGNOSIS — M722 Plantar fascial fibromatosis: Secondary | ICD-10-CM

## 2016-06-30 ENCOUNTER — Ambulatory Visit (INDEPENDENT_AMBULATORY_CARE_PROVIDER_SITE_OTHER): Payer: 59 | Admitting: Podiatry

## 2016-06-30 DIAGNOSIS — M722 Plantar fascial fibromatosis: Secondary | ICD-10-CM

## 2016-07-01 NOTE — Progress Notes (Signed)
Subjective:     Patient ID: [name], female   DOB: [birth year], 46 y.o.   MRN: [id number]  HPI patient presents stating her heel is doing really well   Review of Systems     Objective:   Physical Exam Neurovascular status intact with patient's right plantar heel doing well with wound edges well coapted stitches in place    Assessment:     Doing very well post endoscopic surgery right heel    Plan:     Stitches removed and compression stocking dispensed with gradual return to soft shoe gear over the next several weeks. Patient will be seen back for is to recheck again in approximately 4-6 weeks or earlier if needed and will continue boot usage as needed

## 2016-07-14 DIAGNOSIS — R05 Cough: Secondary | ICD-10-CM | POA: Diagnosis not present

## 2016-07-14 DIAGNOSIS — J329 Chronic sinusitis, unspecified: Secondary | ICD-10-CM | POA: Diagnosis not present

## 2016-08-04 DIAGNOSIS — J069 Acute upper respiratory infection, unspecified: Secondary | ICD-10-CM | POA: Diagnosis not present

## 2016-08-04 DIAGNOSIS — R05 Cough: Secondary | ICD-10-CM | POA: Diagnosis not present

## 2016-08-10 DIAGNOSIS — J209 Acute bronchitis, unspecified: Secondary | ICD-10-CM | POA: Diagnosis not present

## 2016-08-16 ENCOUNTER — Ambulatory Visit: Payer: 59 | Admitting: Podiatry

## 2016-08-18 ENCOUNTER — Encounter: Payer: Self-pay | Admitting: Podiatrist

## 2016-08-18 ENCOUNTER — Ambulatory Visit (INDEPENDENT_AMBULATORY_CARE_PROVIDER_SITE_OTHER): Payer: 59 | Admitting: Podiatrist

## 2016-08-18 DIAGNOSIS — M722 Plantar fascial fibromatosis: Secondary | ICD-10-CM

## 2016-08-18 NOTE — Patient Instructions (Signed)

## 2016-08-18 NOTE — Progress Notes (Signed)
Patient presents for heel pain left heel and follow up on right foot epf.  She states the right foot was doing well until the left started hurting and she favored the right foot too much.  She relates plantar fasciitis symptoms on left greater than right feet.  O:  Neurovascular status intact.  Pain on palpation left heel inferior calcaneus noted consistent with plantar fasciitis.  Skin color, texture and turgur normal  Plan: plantar fasciitis left, healing epf right  Plan.  Injected the left heel with kenalog and marcaine plain.  Gave instructions on stretching.  She will be seen again in 1 month for recheck.  Will call if questions arise before her visit date.

## 2016-08-25 ENCOUNTER — Ambulatory Visit: Payer: 59 | Admitting: Podiatry

## 2016-08-30 ENCOUNTER — Telehealth: Payer: Self-pay | Admitting: *Deleted

## 2016-08-30 ENCOUNTER — Ambulatory Visit (INDEPENDENT_AMBULATORY_CARE_PROVIDER_SITE_OTHER): Payer: 59 | Admitting: Podiatry

## 2016-08-30 DIAGNOSIS — M722 Plantar fascial fibromatosis: Secondary | ICD-10-CM

## 2016-08-30 NOTE — Telephone Encounter (Signed)
"  I was in there earlier today.  I need to schedule my surgery.  I don't want to do it next week but I want the other date you offered me."  You want to do it April 24?  "Yes, that is the date I'd like to schedule my surgery on.  What time will it be again, I forgot?"  Someone from the surgical center will call you with the arrival time on the Friday or Monday prior to surgery date.

## 2016-09-14 NOTE — Progress Notes (Signed)
Subjective:     Patient ID: [name], female   DOB: 1970/12/09, 46 y.o.   MRN: [id number]  HPI patient presents for left heel surgery to discuss this and wants to get it done   Review of Systems     Objective:   Physical Exam Neurovascular status intact with severe discomfort medial fascial band left at the insertional point tendon calcaneus    Assessment:     Acute plantar fasciitis left    Plan:     Discussed all treatment options at a been done allow patient to read consent form going over alternative treatments complications and patient wants surgery understanding risk. Understands she may develop arch pain lateral foot pain or other pains which can last for an extensive period of time and is willing to accept risk and understands recovery can be 6 months to one year. Signs consent form at this time and is scheduled for outpatient endoscopic heel surgery

## 2016-09-16 ENCOUNTER — Encounter: Payer: 59 | Admitting: Podiatry

## 2016-09-20 ENCOUNTER — Telehealth: Payer: Self-pay | Admitting: Sports Medicine

## 2016-09-20 ENCOUNTER — Encounter: Payer: Self-pay | Admitting: Podiatry

## 2016-09-20 DIAGNOSIS — I10 Essential (primary) hypertension: Secondary | ICD-10-CM | POA: Diagnosis not present

## 2016-09-20 DIAGNOSIS — M722 Plantar fascial fibromatosis: Secondary | ICD-10-CM | POA: Diagnosis not present

## 2016-09-20 NOTE — Telephone Encounter (Signed)
Patient reports that she is in severe pain took pain pill around 3 pm and motrin 1 hour ago. I advised patient to loosen dressing, to ice, to elevate and if she needed can take 1/2 pain pill. I advised patient that if pain does not ease up to call the office back -Dr. Cannon Kettle

## 2016-09-26 ENCOUNTER — Ambulatory Visit (INDEPENDENT_AMBULATORY_CARE_PROVIDER_SITE_OTHER): Payer: 59 | Admitting: Podiatry

## 2016-09-26 ENCOUNTER — Encounter: Payer: Self-pay | Admitting: Podiatry

## 2016-09-26 VITALS — Temp 98.6°F

## 2016-09-26 DIAGNOSIS — M722 Plantar fascial fibromatosis: Secondary | ICD-10-CM | POA: Diagnosis not present

## 2016-09-26 NOTE — Progress Notes (Signed)
Subjective:    Patient ID: [name], female   DOB: 46 y.o.   MRN: [id number]   HPI patient states she's doing very well with the left foot    ROS      Objective:  Physical Exam    Neurovascular status intact with patient's left foot showing well-healing surgical sites on the medial lateral heel with wound edges well coapted and negative Homans sign was noted Assessment:    Doing well post endoscopic surgery left     Plan:     Reviewed and reapplied sterile dressing advised a continued elevation compression and utilization of boot for the next 2 weeks. Reappoint for stitch removal or earlier if needed

## 2016-09-26 NOTE — Progress Notes (Signed)
This encounter was created in error - please disregard.

## 2016-10-06 NOTE — Progress Notes (Signed)
DOS [id number] Endoscopic release med band LT heel

## 2016-10-07 DIAGNOSIS — M47816 Spondylosis without myelopathy or radiculopathy, lumbar region: Secondary | ICD-10-CM | POA: Diagnosis not present

## 2016-10-07 DIAGNOSIS — Z6836 Body mass index (BMI) 36.0-36.9, adult: Secondary | ICD-10-CM | POA: Diagnosis not present

## 2016-10-07 DIAGNOSIS — I10 Essential (primary) hypertension: Secondary | ICD-10-CM | POA: Diagnosis not present

## 2016-10-10 ENCOUNTER — Ambulatory Visit (INDEPENDENT_AMBULATORY_CARE_PROVIDER_SITE_OTHER): Payer: 59 | Admitting: Podiatry

## 2016-10-10 DIAGNOSIS — M722 Plantar fascial fibromatosis: Secondary | ICD-10-CM

## 2016-10-11 NOTE — Progress Notes (Signed)
Subjective:    Patient ID: [name], female   DOB: 46 y.o.   MRN: [id number]   HPI patient states she's doing great with her surgery    ROS      Objective:  Physical Exam Neurovascular status intact negative Homans sign noted with patient's heel healing very well with wound edges well coapted stitches in place    Assessment:    Doing well post endoscopic surgery left     Plan:     Stitches removed wound edges well coapted and dressings applied continue with compression stocking and boot as needed and reappoint as needed

## 2016-11-15 DIAGNOSIS — N946 Dysmenorrhea, unspecified: Secondary | ICD-10-CM | POA: Diagnosis not present

## 2016-11-15 DIAGNOSIS — N938 Other specified abnormal uterine and vaginal bleeding: Secondary | ICD-10-CM | POA: Diagnosis not present

## 2016-11-17 DIAGNOSIS — R3911 Hesitancy of micturition: Secondary | ICD-10-CM | POA: Diagnosis not present

## 2016-11-17 DIAGNOSIS — N9489 Other specified conditions associated with female genital organs and menstrual cycle: Secondary | ICD-10-CM | POA: Diagnosis not present

## 2016-11-17 DIAGNOSIS — M797 Fibromyalgia: Secondary | ICD-10-CM | POA: Diagnosis not present

## 2016-11-17 DIAGNOSIS — R102 Pelvic and perineal pain: Secondary | ICD-10-CM | POA: Diagnosis not present

## 2016-11-17 DIAGNOSIS — R300 Dysuria: Secondary | ICD-10-CM | POA: Diagnosis not present

## 2016-11-17 DIAGNOSIS — K582 Mixed irritable bowel syndrome: Secondary | ICD-10-CM | POA: Diagnosis not present

## 2016-11-17 DIAGNOSIS — N3010 Interstitial cystitis (chronic) without hematuria: Secondary | ICD-10-CM | POA: Diagnosis not present

## 2016-11-17 DIAGNOSIS — N3941 Urge incontinence: Secondary | ICD-10-CM | POA: Diagnosis not present

## 2016-11-23 ENCOUNTER — Other Ambulatory Visit: Payer: Self-pay | Admitting: Obstetrics and Gynecology

## 2016-11-25 DIAGNOSIS — N920 Excessive and frequent menstruation with regular cycle: Secondary | ICD-10-CM | POA: Diagnosis not present

## 2016-11-25 DIAGNOSIS — N946 Dysmenorrhea, unspecified: Secondary | ICD-10-CM | POA: Diagnosis not present

## 2016-11-29 ENCOUNTER — Encounter (HOSPITAL_COMMUNITY): Payer: Self-pay

## 2016-11-29 NOTE — Patient Instructions (Signed)
Your procedure is scheduled on:  Friday, December 09, 2016  Enter through the [facility] at:  12:30 PM  Pick up the phone at the desk and dial [phone].  Call this number if you have problems the morning of surgery: [phone].  Remember: Do NOT eat food:  After Midnight Thursday  Do NOT drink clear liquids after:  8:00 AM Friday  Take these medicines the morning of surgery with a SIP OF WATER: Diltiazem, Duloxetine, Lisinopril, Venlafaxine, Lorazepam if needed  Stop ALL herbal medications at this time  Do NOT smoke the day of surgery.  Do NOT wear jewelry (body piercing), metal hair clips/bobby pins, make-up, artifical eyelashes or nail polish. Do NOT wear lotions, powders, or perfumes.  You may wear deodorant. Do NOT shave for 48 hours prior to surgery. Do NOT bring valuables to the hospital. Contacts, dentures, or bridgework may not be worn into surgery.  Have a responsible adult drive you home and stay with you for 24 hours after your procedure  Bring a copy of your healthcare power of attorney and living will documents.

## 2016-12-02 ENCOUNTER — Inpatient Hospital Stay (HOSPITAL_COMMUNITY)
Admission: RE | Admit: 2016-12-02 | Discharge: 2016-12-02 | Disposition: A | Discharge status: Home / Self Care | Payer: 59 | Source: Ambulatory Visit

## 2016-12-09 ENCOUNTER — Ambulatory Visit (HOSPITAL_COMMUNITY): Admission: RE | Admit: 2016-12-09 | Payer: 59 | Source: Ambulatory Visit | Admitting: Obstetrics and Gynecology

## 2016-12-09 ENCOUNTER — Encounter (HOSPITAL_COMMUNITY): Admission: RE | Payer: Self-pay | Source: Ambulatory Visit

## 2016-12-09 SURGERY — DILATATION & CURETTAGE/HYSTEROSCOPY WITH NOVASURE ABLATION
Anesthesia: Choice

## 2016-12-26 DIAGNOSIS — N946 Dysmenorrhea, unspecified: Secondary | ICD-10-CM | POA: Diagnosis not present

## 2017-01-24 DIAGNOSIS — R8290 Unspecified abnormal findings in urine: Secondary | ICD-10-CM | POA: Diagnosis not present

## 2017-01-24 DIAGNOSIS — R3911 Hesitancy of micturition: Secondary | ICD-10-CM | POA: Diagnosis not present

## 2017-01-24 DIAGNOSIS — N3010 Interstitial cystitis (chronic) without hematuria: Secondary | ICD-10-CM | POA: Diagnosis not present

## 2017-02-03 DIAGNOSIS — Z23 Encounter for immunization: Secondary | ICD-10-CM | POA: Diagnosis not present

## 2017-02-27 DIAGNOSIS — N3010 Interstitial cystitis (chronic) without hematuria: Secondary | ICD-10-CM | POA: Diagnosis not present

## 2017-02-27 DIAGNOSIS — M797 Fibromyalgia: Secondary | ICD-10-CM | POA: Diagnosis not present

## 2017-03-02 ENCOUNTER — Ambulatory Visit: Admit: 2017-03-02 | Payer: 59 | Admitting: Obstetrics and Gynecology

## 2017-03-02 SURGERY — ROBOTIC ASSISTED TOTAL HYSTERECTOMY WITH SALPINGECTOMY
Anesthesia: General | Laterality: Bilateral

## 2017-03-10 DIAGNOSIS — R350 Frequency of micturition: Secondary | ICD-10-CM | POA: Diagnosis not present

## 2017-03-10 DIAGNOSIS — M7918 Myalgia, other site: Secondary | ICD-10-CM | POA: Diagnosis not present

## 2017-03-10 DIAGNOSIS — R3915 Urgency of urination: Secondary | ICD-10-CM | POA: Diagnosis not present

## 2017-03-10 DIAGNOSIS — M791 Myalgia, unspecified site: Secondary | ICD-10-CM | POA: Diagnosis not present

## 2017-03-10 DIAGNOSIS — N3010 Interstitial cystitis (chronic) without hematuria: Secondary | ICD-10-CM | POA: Diagnosis not present

## 2017-03-17 ENCOUNTER — Other Ambulatory Visit: Payer: Self-pay | Admitting: Obstetrics and Gynecology

## 2017-03-21 ENCOUNTER — Encounter (HOSPITAL_COMMUNITY): Payer: Self-pay

## 2017-03-21 ENCOUNTER — Encounter (HOSPITAL_COMMUNITY)
Admission: RE | Admit: 2017-03-21 | Discharge: 2017-03-21 | Disposition: A | Discharge status: Home / Self Care | Payer: 59 | Source: Ambulatory Visit | Attending: Obstetrics and Gynecology | Admitting: Obstetrics and Gynecology

## 2017-03-21 DIAGNOSIS — Z01818 Encounter for other preprocedural examination: Secondary | ICD-10-CM | POA: Insufficient documentation

## 2017-03-21 DIAGNOSIS — N946 Dysmenorrhea, unspecified: Secondary | ICD-10-CM | POA: Diagnosis not present

## 2017-03-21 HISTORY — DX: Gastro-esophageal reflux disease without esophagitis: K21.9

## 2017-03-21 LAB — CBC
HEMATOCRIT: 40.6 % (ref 36.0–46.0)
Hemoglobin: 13.6 g/dL (ref 12.0–15.0)
MCH: 28.8 pg (ref 26.0–34.0)
MCHC: 33.5 g/dL (ref 30.0–36.0)
MCV: 86 fL (ref 78.0–100.0)
Platelets: 316 10*3/uL (ref 150–400)
RBC: 4.72 MIL/uL (ref 3.87–5.11)
RDW: 13.3 % (ref 11.5–15.5)
WBC: 10.1 10*3/uL (ref 4.0–10.5)

## 2017-03-21 LAB — BASIC METABOLIC PANEL
Anion gap: 7 (ref 5–15)
BUN: 18 mg/dL (ref 6–20)
CHLORIDE: 100 mmol/L — AB (ref 101–111)
CO2: 23 mmol/L (ref 22–32)
Calcium: 8.6 mg/dL — ABNORMAL LOW (ref 8.9–10.3)
Creatinine, Ser: 0.77 mg/dL (ref 0.44–1.00)
GFR calc non Af Amer: >60 mL/min (ref >60)
Glucose, Bld: 93 mg/dL (ref 65–99)
POTASSIUM: 3.9 mmol/L (ref 3.5–5.1)
SODIUM: 130 mmol/L — AB (ref 135–145)

## 2017-03-21 NOTE — Pre-Procedure Instructions (Signed)
CBC and BMP faxed to Dr. Kennith Maes office. Shanelle confirmed they were received.

## 2017-03-21 NOTE — Patient Instructions (Addendum)
Your procedure is scheduled on:  Thursday, Nov. 1, 2018  Enter through the [employer] [facility] at:  11:30 AM  Pick up the phone at the desk and dial [phone].  Call this number if you have problems the morning of surgery: [phone].  Remember: Do NOT eat food:  After Midnight Wednesday  Do NOT drink clear liquids after:  7:00 AM Thursday  Take these medicines the morning of surgery with a SIP OF WATER:  Diltiazem,  Lyrica, Venlafaxine, Ativan  Stop ALL herbal medications at this time  Do NOT smoke the day of surgery.  Do NOT wear jewelry (body piercing), metal hair clips/bobby pins, make-up, artifical eyelashes or nail polish. Do NOT wear lotions, powders, or perfumes.  You may wear deodorant. Do NOT shave for 48 hours prior to surgery. Do NOT bring valuables to the hospital. Contacts, dentures, or bridgework may not be worn into surgery.  Leave suitcase in car.  After surgery it may be brought to your room.  For patients admitted to the hospital, checkout time is 11:00 AM the day of discharge.  Bring a copy of your healthcare power of attorney and living will documents.

## 2017-03-28 ENCOUNTER — Encounter (HOSPITAL_COMMUNITY): Payer: Self-pay

## 2017-03-30 ENCOUNTER — Encounter (HOSPITAL_COMMUNITY): Payer: Self-pay

## 2017-03-30 ENCOUNTER — Encounter (HOSPITAL_COMMUNITY)
Admission: AD | Disposition: A | Discharge status: Home / Self Care | Payer: Self-pay | Source: Ambulatory Visit | Attending: Obstetrics and Gynecology

## 2017-03-30 ENCOUNTER — Ambulatory Visit (HOSPITAL_COMMUNITY)
Admission: AD | Admit: 2017-03-30 | Discharge: 2017-03-31 | Disposition: A | Discharge status: Home / Self Care | Payer: 59 | Source: Ambulatory Visit | Attending: Obstetrics and Gynecology | Admitting: Obstetrics and Gynecology

## 2017-03-30 ENCOUNTER — Ambulatory Visit (HOSPITAL_COMMUNITY): Payer: 59 | Admitting: Anesthesiology

## 2017-03-30 DIAGNOSIS — N736 Female pelvic peritoneal adhesions (postinfective): Secondary | ICD-10-CM | POA: Diagnosis not present

## 2017-03-30 DIAGNOSIS — D259 Leiomyoma of uterus, unspecified: Secondary | ICD-10-CM | POA: Diagnosis not present

## 2017-03-30 DIAGNOSIS — Z87891 Personal history of nicotine dependence: Secondary | ICD-10-CM | POA: Diagnosis not present

## 2017-03-30 DIAGNOSIS — N946 Dysmenorrhea, unspecified: Secondary | ICD-10-CM | POA: Diagnosis present

## 2017-03-30 DIAGNOSIS — N944 Primary dysmenorrhea: Secondary | ICD-10-CM | POA: Diagnosis not present

## 2017-03-30 DIAGNOSIS — N815 Vaginal enterocele: Secondary | ICD-10-CM | POA: Diagnosis not present

## 2017-03-30 DIAGNOSIS — Z6837 Body mass index (BMI) 37.0-37.9, adult: Secondary | ICD-10-CM | POA: Insufficient documentation

## 2017-03-30 DIAGNOSIS — S3723XA Laceration of bladder, initial encounter: Secondary | ICD-10-CM | POA: Diagnosis not present

## 2017-03-30 DIAGNOSIS — N920 Excessive and frequent menstruation with regular cycle: Secondary | ICD-10-CM | POA: Diagnosis not present

## 2017-03-30 DIAGNOSIS — N3010 Interstitial cystitis (chronic) without hematuria: Secondary | ICD-10-CM | POA: Diagnosis not present

## 2017-03-30 DIAGNOSIS — K660 Peritoneal adhesions (postprocedural) (postinfection): Secondary | ICD-10-CM | POA: Insufficient documentation

## 2017-03-30 DIAGNOSIS — N72 Inflammatory disease of cervix uteri: Secondary | ICD-10-CM | POA: Insufficient documentation

## 2017-03-30 DIAGNOSIS — E669 Obesity, unspecified: Secondary | ICD-10-CM | POA: Insufficient documentation

## 2017-03-30 DIAGNOSIS — Z79899 Other long term (current) drug therapy: Secondary | ICD-10-CM | POA: Diagnosis not present

## 2017-03-30 DIAGNOSIS — I10 Essential (primary) hypertension: Secondary | ICD-10-CM | POA: Insufficient documentation

## 2017-03-30 HISTORY — PX: ROBOTIC ASSISTED TOTAL HYSTERECTOMY WITH SALPINGECTOMY: SHX6679

## 2017-03-30 LAB — HCG, SERUM, QUALITATIVE: Preg, Serum: NEGATIVE

## 2017-03-30 SURGERY — ROBOTIC ASSISTED TOTAL HYSTERECTOMY WITH SALPINGECTOMY
Anesthesia: General | Site: Abdomen | Laterality: Bilateral

## 2017-03-30 MED ORDER — ROCURONIUM BROMIDE 100 MG/10ML IV SOLN
INTRAVENOUS | Status: AC
  Filled 2017-03-30: qty 1

## 2017-03-30 MED ORDER — PENTOSAN POLYSULFATE SODIUM 100 MG PO CAPS
100.0000 mg | ORAL_CAPSULE | Freq: Two times a day (BID) | ORAL | Status: DC
  Administered 2017-03-30 – 2017-03-31 (×2): 100 mg via ORAL
  Filled 2017-03-30 (×2): qty 1

## 2017-03-30 MED ORDER — LACTATED RINGERS IV SOLN
INTRAVENOUS | Status: DC | PRN
  Administered 2017-03-30: 13:00:00 via INTRAVENOUS

## 2017-03-30 MED ORDER — LIDOCAINE HCL (CARDIAC) 20 MG/ML IV SOLN
INTRAVENOUS | Status: DC | PRN
  Administered 2017-03-30: 100 mg via INTRAVENOUS

## 2017-03-30 MED ORDER — OXYCODONE-ACETAMINOPHEN 5-325 MG PO TABS
1.0000 | ORAL_TABLET | ORAL | Status: DC | PRN
  Administered 2017-03-31 (×3): 2 via ORAL
  Filled 2017-03-30 (×3): qty 2

## 2017-03-30 MED ORDER — BUPIVACAINE HCL (PF) 0.25 % IJ SOLN
INTRAMUSCULAR | Status: AC
  Filled 2017-03-30: qty 30

## 2017-03-30 MED ORDER — PROMETHAZINE HCL 25 MG/ML IJ SOLN
6.2500 mg | INTRAMUSCULAR | Status: DC | PRN
  Administered 2017-03-30: 12.5 mg via INTRAVENOUS

## 2017-03-30 MED ORDER — CEFAZOLIN SODIUM-DEXTROSE 2-4 GM/100ML-% IV SOLN
2.0000 g | INTRAVENOUS | Status: AC
  Administered 2017-03-30: 2 g via INTRAVENOUS

## 2017-03-30 MED ORDER — SODIUM CHLORIDE 0.9 % IJ SOLN
INTRAMUSCULAR | Status: AC
Start: 2017-03-30 — End: 2017-03-30
  Filled 2017-03-30: qty 50

## 2017-03-30 MED ORDER — ONDANSETRON HCL 4 MG/2ML IJ SOLN: 4.0000 mg | Freq: Four times a day (QID) | INTRAMUSCULAR | Status: DC | PRN

## 2017-03-30 MED ORDER — KETOROLAC TROMETHAMINE 30 MG/ML IJ SOLN
INTRAMUSCULAR | Status: DC | PRN
  Administered 2017-03-30: 30 mg via INTRAVENOUS

## 2017-03-30 MED ORDER — HYDROMORPHONE HCL 1 MG/ML IJ SOLN
INTRAMUSCULAR | Status: DC | PRN
  Administered 2017-03-30 (×2): 0.5 mg via INTRAVENOUS

## 2017-03-30 MED ORDER — VENLAFAXINE HCL 75 MG PO TABS
75.0000 mg | ORAL_TABLET | Freq: Two times a day (BID) | ORAL | Status: DC
  Administered 2017-03-31: 75 mg via ORAL
  Filled 2017-03-30 (×2): qty 1

## 2017-03-30 MED ORDER — DIPHENHYDRAMINE HCL 50 MG/ML IJ SOLN: 12.5000 mg | Freq: Four times a day (QID) | INTRAMUSCULAR | Status: DC | PRN

## 2017-03-30 MED ORDER — FENTANYL CITRATE (PF) 100 MCG/2ML IJ SOLN
INTRAMUSCULAR | Status: AC
  Filled 2017-03-30: qty 2

## 2017-03-30 MED ORDER — ONDANSETRON HCL 4 MG/2ML IJ SOLN
INTRAMUSCULAR | Status: DC | PRN
  Administered 2017-03-30: 4 mg via INTRAVENOUS

## 2017-03-30 MED ORDER — ROPIVACAINE HCL 5 MG/ML IJ SOLN
INTRAMUSCULAR | Status: AC
  Filled 2017-03-30: qty 30

## 2017-03-30 MED ORDER — ACETAMINOPHEN 500 MG PO TABS
1000.0000 mg | ORAL_TABLET | Freq: Once | ORAL | Status: AC
  Administered 2017-03-30: 1000 mg via ORAL

## 2017-03-30 MED ORDER — ACETAMINOPHEN 500 MG PO TABS
ORAL_TABLET | ORAL | Status: AC
  Administered 2017-03-30: 1000 mg via ORAL
  Filled 2017-03-30: qty 2

## 2017-03-30 MED ORDER — METHYLENE BLUE 0.5 % INJ SOLN
INTRAVENOUS | Status: AC
  Filled 2017-03-30: qty 10

## 2017-03-30 MED ORDER — ROCURONIUM BROMIDE 100 MG/10ML IV SOLN
INTRAVENOUS | Status: DC | PRN
  Administered 2017-03-30: 50 mg via INTRAVENOUS
  Administered 2017-03-30: 10 mg via INTRAVENOUS

## 2017-03-30 MED ORDER — DEXTROSE IN LACTATED RINGERS 5 % IV SOLN
INTRAVENOUS | Status: DC
  Administered 2017-03-30: 19:00:00 via INTRAVENOUS

## 2017-03-30 MED ORDER — PROPOFOL 10 MG/ML IV BOLUS
INTRAVENOUS | Status: DC | PRN
  Administered 2017-03-30: 200 mg via INTRAVENOUS

## 2017-03-30 MED ORDER — FENTANYL CITRATE (PF) 250 MCG/5ML IJ SOLN
INTRAMUSCULAR | Status: AC
  Filled 2017-03-30: qty 5

## 2017-03-30 MED ORDER — DEXAMETHASONE SODIUM PHOSPHATE 10 MG/ML IJ SOLN
INTRAMUSCULAR | Status: DC | PRN
  Administered 2017-03-30: 4 mg via INTRAVENOUS

## 2017-03-30 MED ORDER — LISINOPRIL 10 MG PO TABS
10.0000 mg | ORAL_TABLET | Freq: Every day | ORAL | Status: DC
  Administered 2017-03-31: 10 mg via ORAL
  Filled 2017-03-30 (×2): qty 1

## 2017-03-30 MED ORDER — ARTIFICIAL TEARS OPHTHALMIC OINT
TOPICAL_OINTMENT | OPHTHALMIC | Status: AC
  Filled 2017-03-30: qty 3.5

## 2017-03-30 MED ORDER — ALBUTEROL SULFATE HFA 108 (90 BASE) MCG/ACT IN AERS
INHALATION_SPRAY | RESPIRATORY_TRACT | Status: DC | PRN
  Administered 2017-03-30 (×2): 2 via RESPIRATORY_TRACT

## 2017-03-30 MED ORDER — SODIUM CHLORIDE 0.9% FLUSH: 9.0000 mL | INTRAVENOUS | Status: DC | PRN

## 2017-03-30 MED ORDER — SUGAMMADEX SODIUM 200 MG/2ML IV SOLN
INTRAVENOUS | Status: DC | PRN
  Administered 2017-03-30: 200 mg via INTRAVENOUS

## 2017-03-30 MED ORDER — SODIUM CHLORIDE 0.9 % IV SOLN
INTRAVENOUS | Status: DC | PRN
  Administered 2017-03-30: 60 mL

## 2017-03-30 MED ORDER — FENTANYL CITRATE (PF) 100 MCG/2ML IJ SOLN
25.0000 ug | INTRAMUSCULAR | Status: DC | PRN
  Administered 2017-03-30 (×2): 50 ug via INTRAVENOUS

## 2017-03-30 MED ORDER — MIDAZOLAM HCL 2 MG/2ML IJ SOLN
INTRAMUSCULAR | Status: AC
  Filled 2017-03-30: qty 2

## 2017-03-30 MED ORDER — FENTANYL CITRATE (PF) 100 MCG/2ML IJ SOLN
INTRAMUSCULAR | Status: DC | PRN
  Administered 2017-03-30 (×2): 50 ug via INTRAVENOUS
  Administered 2017-03-30: 100 ug via INTRAVENOUS
  Administered 2017-03-30 (×2): 25 ug via INTRAVENOUS

## 2017-03-30 MED ORDER — MIDAZOLAM HCL 2 MG/2ML IJ SOLN
INTRAMUSCULAR | Status: DC | PRN
  Administered 2017-03-30: 2 mg via INTRAVENOUS

## 2017-03-30 MED ORDER — DILTIAZEM HCL ER COATED BEADS 240 MG PO CP24
240.0000 mg | ORAL_CAPSULE | Freq: Every day | ORAL | Status: DC
  Administered 2017-03-31: 240 mg via ORAL
  Filled 2017-03-30: qty 1

## 2017-03-30 MED ORDER — ALBUTEROL SULFATE HFA 108 (90 BASE) MCG/ACT IN AERS
INHALATION_SPRAY | RESPIRATORY_TRACT | Status: AC
  Filled 2017-03-30: qty 6.7

## 2017-03-30 MED ORDER — SODIUM CHLORIDE 0.9 % IR SOLN
Status: DC | PRN
  Administered 2017-03-30: 3000 mL

## 2017-03-30 MED ORDER — LIDOCAINE HCL (CARDIAC) 20 MG/ML IV SOLN
INTRAVENOUS | Status: AC
  Filled 2017-03-30: qty 5

## 2017-03-30 MED ORDER — PROMETHAZINE HCL 25 MG/ML IJ SOLN
INTRAMUSCULAR | Status: AC
  Filled 2017-03-30: qty 1

## 2017-03-30 MED ORDER — SUGAMMADEX SODIUM 200 MG/2ML IV SOLN
INTRAVENOUS | Status: AC
  Filled 2017-03-30: qty 2

## 2017-03-30 MED ORDER — SCOPOLAMINE 1 MG/3DAYS TD PT72: 1.0000 | MEDICATED_PATCH | Freq: Once | TRANSDERMAL | Status: DC

## 2017-03-30 MED ORDER — LACTATED RINGERS IV SOLN
INTRAVENOUS | Status: DC
  Administered 2017-03-30: 125 mL/h via INTRAVENOUS

## 2017-03-30 MED ORDER — DIPHENHYDRAMINE HCL 12.5 MG/5ML PO ELIX: 12.5000 mg | ORAL_SOLUTION | Freq: Four times a day (QID) | ORAL | Status: DC | PRN

## 2017-03-30 MED ORDER — CEFAZOLIN SODIUM-DEXTROSE 2-3 GM-%(50ML) IV SOLR
INTRAVENOUS | Status: AC
  Filled 2017-03-30: qty 50

## 2017-03-30 MED ORDER — SCOPOLAMINE 1 MG/3DAYS TD PT72
MEDICATED_PATCH | TRANSDERMAL | Status: AC
  Filled 2017-03-30: qty 1

## 2017-03-30 MED ORDER — PROPOFOL 10 MG/ML IV BOLUS
INTRAVENOUS | Status: AC
  Filled 2017-03-30: qty 20

## 2017-03-30 MED ORDER — METHYLENE BLUE 0.5 % INJ SOLN
INTRAVENOUS | Status: DC | PRN
  Administered 2017-03-30: 2 mL via INTRAVESICAL

## 2017-03-30 MED ORDER — SCOPOLAMINE 1 MG/3DAYS TD PT72
1.0000 | MEDICATED_PATCH | Freq: Once | TRANSDERMAL | Status: DC
  Administered 2017-03-30: 1.5 mg via TRANSDERMAL

## 2017-03-30 MED ORDER — ONDANSETRON HCL 4 MG/2ML IJ SOLN
INTRAMUSCULAR | Status: AC
  Filled 2017-03-30: qty 2

## 2017-03-30 MED ORDER — BUPIVACAINE HCL (PF) 0.25 % IJ SOLN
INTRAMUSCULAR | Status: DC | PRN
  Administered 2017-03-30: 12 mL

## 2017-03-30 MED ORDER — TRAMADOL HCL 50 MG PO TABS
50.0000 mg | ORAL_TABLET | Freq: Four times a day (QID) | ORAL | Status: DC | PRN
  Administered 2017-03-31: 50 mg via ORAL
  Filled 2017-03-30: qty 1

## 2017-03-30 MED ORDER — DEXAMETHASONE SODIUM PHOSPHATE 4 MG/ML IJ SOLN
INTRAMUSCULAR | Status: AC
  Filled 2017-03-30: qty 1

## 2017-03-30 MED ORDER — HYDROMORPHONE HCL 1 MG/ML IJ SOLN
INTRAMUSCULAR | Status: AC
  Filled 2017-03-30: qty 1

## 2017-03-30 MED ORDER — NALOXONE HCL 0.4 MG/ML IJ SOLN: 0.4000 mg | INTRAMUSCULAR | Status: DC | PRN

## 2017-03-30 MED ORDER — HYDROMORPHONE 1 MG/ML IV SOLN
INTRAVENOUS | Status: DC
  Administered 2017-03-30: 19:00:00 via INTRAVENOUS
  Administered 2017-03-30: 2.2 mg via INTRAVENOUS
  Filled 2017-03-30: qty 25

## 2017-03-30 MED ORDER — KETOROLAC TROMETHAMINE 30 MG/ML IJ SOLN
INTRAMUSCULAR | Status: AC
  Filled 2017-03-30: qty 1

## 2017-03-30 SURGICAL SUPPLY — 53 items
ADH SKN CLS APL DERMABOND .7 (GAUZE/BANDAGES/DRESSINGS) ×1
CATH FOLEY 3WAY  5CC 16FR (CATHETERS) ×2
CATH FOLEY 3WAY 5CC 16FR (CATHETERS) ×1 IMPLANT
CLOTH BEACON ORANGE TIMEOUT ST (SAFETY) ×3 IMPLANT
CONT PATH 16OZ SNAP LID 3702 (MISCELLANEOUS) ×3 IMPLANT
COVER BACK TABLE 60X90IN (DRAPES) ×6 IMPLANT
COVER TIP SHEARS 8 DVNC (MISCELLANEOUS) ×1 IMPLANT
COVER TIP SHEARS 8MM DA VINCI (MISCELLANEOUS) ×2
DECANTER SPIKE VIAL GLASS SM (MISCELLANEOUS) ×9 IMPLANT
DEFOGGER SCOPE WARMER CLEARIFY (MISCELLANEOUS) ×3 IMPLANT
DERMABOND ADVANCED (GAUZE/BANDAGES/DRESSINGS) ×2
DERMABOND ADVANCED .7 DNX12 (GAUZE/BANDAGES/DRESSINGS) ×1 IMPLANT
DURAPREP 26ML APPLICATOR (WOUND CARE) ×3 IMPLANT
ELECT REM PT RETURN 9FT ADLT (ELECTROSURGICAL) ×3
ELECTRODE REM PT RTRN 9FT ADLT (ELECTROSURGICAL) ×1 IMPLANT
GLOVE BIO SURGEON STRL SZ7.5 (GLOVE) ×9 IMPLANT
GLOVE BIOGEL PI IND STRL 7.0 (GLOVE) ×2 IMPLANT
GLOVE BIOGEL PI INDICATOR 7.0 (GLOVE) ×4
KIT ACCESSORY DA VINCI DISP (KITS) ×2
KIT ACCESSORY DVNC DISP (KITS) ×1 IMPLANT
LEGGING LITHOTOMY PAIR STRL (DRAPES) ×3 IMPLANT
NEEDLE INSUFFLATION 150MM (ENDOMECHANICALS) ×3 IMPLANT
OCCLUDER COLPOPNEUMO (BALLOONS) ×3 IMPLANT
PACK ROBOT WH (CUSTOM PROCEDURE TRAY) ×3 IMPLANT
PACK ROBOTIC GOWN (GOWN DISPOSABLE) ×3 IMPLANT
PACK TRENDGUARD 450 HYBRID PRO (MISCELLANEOUS) IMPLANT
PACK TRENDGUARD 600 HYBRD PROC (MISCELLANEOUS) IMPLANT
PAD PREP 24X48 CUFFED NSTRL (MISCELLANEOUS) ×3 IMPLANT
POUCH LAPAROSCOPIC INSTRUMENT (MISCELLANEOUS) ×2 IMPLANT
PROTECTOR NERVE ULNAR (MISCELLANEOUS) ×10 IMPLANT
SET CYSTO W/LG BORE CLAMP LF (SET/KITS/TRAYS/PACK) IMPLANT
SET IRRIG TUBING LAPAROSCOPIC (IRRIGATION / IRRIGATOR) ×3 IMPLANT
SET TRI-LUMEN FLTR TB AIRSEAL (TUBING) ×3 IMPLANT
SUT VIC AB 0 CT1 27 (SUTURE) ×6
SUT VIC AB 0 CT1 27XBRD ANBCTR (SUTURE) ×2 IMPLANT
SUT VICRYL 0 UR6 27IN ABS (SUTURE) ×3 IMPLANT
SUT VICRYL RAPIDE 4/0 PS 2 (SUTURE) ×6 IMPLANT
SUT VLOC 180 0 9IN  GS21 (SUTURE)
SUT VLOC 180 0 9IN GS21 (SUTURE) IMPLANT
SYR 10ML LL (SYRINGE) ×6 IMPLANT
SYR 50ML LL SCALE MARK (SYRINGE) ×3 IMPLANT
TIP RUMI ORANGE 6.7MMX12CM (TIP) IMPLANT
TIP UTERINE 5.1X6CM LAV DISP (MISCELLANEOUS) IMPLANT
TIP UTERINE 6.7X10CM GRN DISP (MISCELLANEOUS) IMPLANT
TIP UTERINE 6.7X6CM WHT DISP (MISCELLANEOUS) IMPLANT
TIP UTERINE 6.7X8CM BLUE DISP (MISCELLANEOUS) ×2 IMPLANT
TOWEL OR 17X24 6PK STRL BLUE (TOWEL DISPOSABLE) ×9 IMPLANT
TRENDGUARD 450 HYBRID PRO PACK (MISCELLANEOUS) ×3
TRENDGUARD 600 HYBRID PROC PK (MISCELLANEOUS)
TROCAR DISP BLADELESS 8 DVNC (TROCAR) ×1 IMPLANT
TROCAR DISP BLADELESS 8MM (TROCAR) ×2
TROCAR PORT AIRSEAL 5X120 (TROCAR) ×3 IMPLANT
TROCAR Z-THREAD 12X150 (TROCAR) ×3 IMPLANT

## 2017-03-30 NOTE — Op Note (Signed)
[name], [name] NO.:  [id number]  MEDICAL RECORD NO.:  [id number]  LOCATION:                                 FACILITY:  PHYSICIAN:  Lovenia Kim, M.D.     DATE OF BIRTH:  DATE OF PROCEDURE: DATE OF DISCHARGE:                              OPERATIVE REPORT   PREOPERATIVE DIAGNOSES:  Severe dysmenorrhea and menorrhagia.  POSTOPERATIVE DIAGNOSES: 1. Severe dysmenorrhea and menorrhagia. 2. Marked adhesions of the bowel to the anterior abdominal wall. 3. Enterocele. 4. Dense bladder adhesions.  PROCEDURES: 1. Da Vinci assisted total laparoscopic hysterectomy. 2. Bilateral salpingectomy. 3. Incidental cystotomy with repair. 4. Lysis of adhesions of the bowel and omentum to the anterior     abdominal wall and to the left adnexa. 5. McCall culdoplasty.  SURGEON:  Lovenia Kim, M.D.  ASSISTANT:  [name] Dach, MD  ANESTHESIA:  General and local.  ESTIMATED BLOOD LOSS:  50 mL.  COMPLICATIONS:  None.  DRAINS:  Foley.  COUNTS:  Correct.  The patient to recovery in good condition.  BRIEF OPERATIVE NOTE:  After being apprised of the risks of anesthesia, infection, bleeding, injury to surrounding organs, possible need for repair, delayed versus immediate complications to include bowel and bladder injury, possible need for repair, the patient was brought to the operating room, where she was administered a general anesthetic without complications.  Prepped and draped in usual sterile fashion.  Foley catheter placed.  RUMI retractor placed vaginally without difficulty. At this time, infraumbilical incision was made.  Veress needle placed, opening pressure of -1, 4 L of CO2 insufflated without difficulty. Trocar placed atraumatically.  Visualization reveals dense bladder adhesions.  It is noted adhesions of the bowel and omentum to the anterior abdominal wall, and left adnexa bowel adhesions.  There was an area cephalad to the incision,  where the patient previously had a feeding tube, where there are some adhesions of the bowel serosa to the anterior abdominal wall.  At this time, 2 ports were placed on the left side, 1 port on the right, two 8 mm and a 5 mm, and all were placed under direct visualization.  Robot docked in a standard fashion.  PK forceps, Endo Shear were placed.  At this time, the left ureter was identified.  The mesosalpinx was undermined and the left tube was divided and the retroperitoneal space was entered on the left. Dissection to skeletonize the ovarian ligament which was then cauterized and divided.  The round ligament cauterized and divided.  The uterine vessels skeletonized on the left.  The bladder flap was developed with great care and sharply.  Good plane was found after dissecting through dense adhesions.  On the right, similarly, the right mesosalpinx was undermined and divided using monopolar cautery.  Retroperitoneal space was entered.  Ureter identified.  The ureter was dissected sharply off the medial leaf of the peritoneum, pushing it distal to the field of dissection.  The ovarian ligament cauterized using bipolar cautery and divided.  Round ligament opened.  The uterine vessel was skeletonized on the right, cauterized and divided.  Bladder flap further developed. Good hemostasis noted at this time.  The uterine vessels  were cauterized and divided on the right and then the left.  The bladder flap was clear. The specimen was divided at the cervicovaginal junction.  At this time, the specimen was retracted vaginally and there was notation at this time of some blood appearing in the urine.  At this time, the Foley tip was seen and an injury to the dome of the bladder, which was approximately 2 cm in a horizontal location.  This extensively occurring during removal of the specimen and trauma to the dome of the bladder, which was already significantly showing evidence of significant  inflammation.  The edges are well defined and closed in multiple layers of a running 3-0 Vicryl suture.  The bladder was retrograde filled with dilute solution of methylene blue dye and no blue extravasation was seen at this time. Good hemostasis was achieved.  After the vaginal cuff was closed in 2 running layers of 0 V-Loc suture, McCall culdoplasty suture placed. Retrograde filling again of the bladder reveals no evidence of extravasation.  At this time, vaginal exam confirms that all instruments were removed under direct visualization.  CO2 was released.  Please note, prior to removal of the camera, ureters were seen peristalsing normally bilaterally, urine is clearing.  At this time, after removal of all instruments, CO2 has been released, positive pressure applied. Incisions were closed with 4-0 Vicryl, 0 Vicryl, and Dermabond.  Dilute Marcaine solution placed.  Vaginal exam confirming an appropriate vaginal closure all along the cuff.  No evidence of dehiscence.  At this time, procedure was terminated.  The patient was awakened and transferred to recovery in good condition.  Foley catheter will remain indwelling for 7 days.     Lovenia Kim, M.D.     RJT/MEDQ  D:  03/30/2017  T:  03/30/2017  Job:  [id number]  cc:   Lovenia Kim, M.D. Fax: [phone]

## 2017-03-30 NOTE — Op Note (Signed)
03/30/2017  4:18 PM  PATIENT:  [name]  46 y.o. female  PRE-OPERATIVE DIAGNOSIS:  Dysmenorrhea, Menorrhagia  POST-OPERATIVE DIAGNOSIS:  Dysmenorrhea, Menorrhagia  PROCEDURE:  Procedure(s): ROBOTIC ASSISTED TOTAL HYSTERECTOMY WITH SALPINGECTOMY  INCIDENTAL CYSTOTOMY WITH REPAIR LYSIS OF BOWEL ADHESIONS TO ANTERIOR ABDOMINAL WALL MCCALL CUL DE PLASTY  SURGEON:  Surgeon(s): Brien Few, MD Aloha Gell, MD  ASSISTANTS: Pamala Hurry, MD   ANESTHESIA:   local and general  ESTIMATED BLOOD LOSS: 50 mL   DRAINS: Urinary Catheter (Foley)   LOCAL MEDICATIONS USED:  MARCAINE    and Amount: 20 ml  SPECIMEN:  Source of Specimen:  UTERUS , CERVIX AND BILATERAL TUBES  DISPOSITION OF SPECIMEN:  PATHOLOGY  COUNTS:  YES  DICTATION #: [id number]  PLAN OF CARE: DC HOME IN AM  PATIENT DISPOSITION:  PACU - hemodynamically stable.

## 2017-03-30 NOTE — Anesthesia Procedure Notes (Signed)
Procedure Name: Intubation Date/Time: 03/30/2017 1:27 PM Performed by: Hewitt Blade Pre-anesthesia Checklist: Patient identified, Emergency Drugs available, Suction available and Patient being monitored Patient Re-evaluated:Patient Re-evaluated prior to induction Oxygen Delivery Method: Circle system utilized Preoxygenation: Pre-oxygenation with 100% oxygen Induction Type: IV induction Ventilation: Mask ventilation without difficulty Laryngoscope Size: Mac and 3 Grade View: Grade I Tube type: Oral Tube size: 7.0 mm Number of attempts: 1 Airway Equipment and Method: Stylet Placement Confirmation: ETT inserted through vocal cords under direct vision,  positive ETCO2 and breath sounds checked- equal and bilateral Secured at: 21 cm Tube secured with: Tape Dental Injury: Teeth and Oropharynx as per pre-operative assessment

## 2017-03-30 NOTE — Anesthesia Preprocedure Evaluation (Signed)
Anesthesia Evaluation  Patient identified by MRN, date of birth, ID band Patient awake    Reviewed: Allergy & Precautions, NPO status , Patient's Chart, lab work & pertinent test results  History of Anesthesia Complications (+) history of anesthetic complications ("pulmonary edema in 2001")  Airway Mallampati: II  TM Distance: >3 FB Neck ROM: Full    Dental  (+) Teeth Intact, Dental Advisory Given   Pulmonary former smoker,    Pulmonary exam normal breath sounds clear to auscultation       Cardiovascular hypertension, Pt. on medications Normal cardiovascular exam Rhythm:Regular Rate:Normal  05/03/16 Cardiology Note: Several years ago she had transient pulmonary artery hypertension that was effectively treated with diuresis, delivery of her babies, and diltiazem. Her most recent pulmonary pressures have been normal.   Neuro/Psych  Headaches, Seizures -, Well Controlled,  PSYCHIATRIC DISORDERS Anxiety Depression Bipolar Disorder    GI/Hepatic Neg liver ROS, PUD, GERD  ,  Endo/Other  Obesity   Renal/GU negative Renal ROS   Interstitial cystitis    Musculoskeletal  (+) Arthritis , Fibromyalgia -  Abdominal   Peds  Hematology negative hematology ROS (+)   Anesthesia Other Findings Day of surgery medications reviewed with the patient.  Reproductive/Obstetrics Dysmenorrhea, Menorrhagia                            Anesthesia Physical Anesthesia Plan  ASA: III  Anesthesia Plan: General   Post-op Pain Management:    Induction: Intravenous  PONV Risk Score and Plan: 4 or greater and Ondansetron, Dexamethasone, Midazolam and Scopolamine patch - Pre-op  Airway Management Planned: Oral ETT  Additional Equipment:   Intra-op Plan:   Post-operative Plan: Extubation in OR  Informed Consent: I have reviewed the patients History and Physical, chart, labs and discussed the procedure including  the risks, benefits and alternatives for the proposed anesthesia with the patient or authorized representative who has indicated his/her understanding and acceptance.   Dental advisory given  Plan Discussed with: CRNA  Anesthesia Plan Comments: (Risks/benefits of general anesthesia discussed with patient including risk of damage to teeth, lips, gum, and tongue, nausea/vomiting, allergic reactions to medications, and the possibility of heart attack, stroke and death.  All patient questions answered.  Patient wishes to proceed.)        Anesthesia Quick Evaluation

## 2017-03-30 NOTE — Transfer of Care (Signed)
Immediate Anesthesia Transfer of Care Note  Patient: [name]  Procedure(s) Performed: ROBOTIC ASSISTED TOTAL HYSTERECTOMY WITH SALPINGECTOMY WITH INCIDENTAL CYSTOTOMY WITH REPAIR (Bilateral Abdomen)  Patient Location: PACU  Anesthesia Type:General  Level of Consciousness: sedated  Airway & Oxygen Therapy: Patient Spontanous Breathing and Patient connected to nasal cannula oxygen  Post-op Assessment: Report given to RN  Post vital signs: Reviewed and stable  Last Vitals:  Vitals:   03/30/17 1206  BP: 115/88  Pulse: 98  Resp: 20  Temp: 36.9 C  SpO2: 96%    Last Pain:  Vitals:   03/30/17 1206  TempSrc: Oral  PainSc: 5       Patients Stated Pain Goal: 4 (01/41/03 0131)  Complications: No apparent anesthesia complications

## 2017-03-30 NOTE — H&P (Signed)
[name], LASKER NO.:  [id number]  MEDICAL RECORD NO.:  [id number]  LOCATION:                                 FACILITY:  PHYSICIAN:  Lovenia Kim, M.D.     DATE OF BIRTH:  DATE OF ADMISSION:  03/30/2017 DATE OF DISCHARGE:                             HISTORY & PHYSICAL   PREOPERATIVE DIAGNOSES:  Dysmenorrhea and menorrhagia for definitive therapy.  HISTORY OF PRESENT ILLNESS:  A 46 year old white female G2, P1, history of C-section for twins for definitive therapy of dysmenorrhea, menorrhagia.  MEDICATIONS:  Ultram as needed, minipill, Valtrex as needed, Cymbalta, Cartia, Effexor, trazodone as needed, hydroxyzine, oxycodone as needed, lisinopril.  She has allergies which were multiple to include: 1. Codeine. 2. Ambien. 3. Celebrex. 4. Lunesta. 5. Methadone. 6. Flexeril. 7. Duragesic. 8. Dulcolax suppositories. 9. GlycoLax suppositories.  FAMILY HISTORY:  High cholesterol, hypothyroidism, heart disease, diabetes, and hypertension.  PERSONAL HISTORY: 1. Shingles. 2. Migraine headaches. 3. Interstitial cystitis. 4. Arthritis. 5. Kidney stones. 6. History of severe preeclampsia with pregnancy. 7. Pulmonary edema.  PHYSICAL EXAMINATION:  GENERAL:  A well-developed, well-nourished white female, in no acute distress HEENT:  Normal. NECK:  Supple.  Full range of motion. LUNGS:  Clear. HEART:  Regular rate and rhythm. ABDOMEN:  Soft, nontender. PELVIC:  A bulky retroflexed uterus and no adnexal masses. EXTREMITIES:  There are no cords. NEUROLOGIC:  Nonfocal. SKIN:  Intact.  IMPRESSION:  Severe dysmenorrhea and menorrhagia for definitive therapy. The patient failed IUD placement and is not a candidate for birth control pills and was reluctant to proceed with endometrial ablation due to her history of InterStim implantation for interstitial cystitis.  PLAN:  Will be to proceed with da Vinci total laparoscopic hysterectomy, bilateral  salpingectomy.  Risks of infection, bleeding, injury to surrounding organs, possible need for repair are discussed.  Delayed versus immediate complications to include bowel and bladder injury are noted.  The patient acknowledges and wishes to proceed.     Lovenia Kim, M.D.   ______________________________ Lovenia Kim, M.D.    RJT/MEDQ  D:  03/29/2017  T:  03/29/2017  Job:  [id number]

## 2017-03-30 NOTE — Progress Notes (Signed)
Patient ID: BRAYAH URQUILLA, female   DOB: [birth year], 46 y.o.   MRN: [id number] Patient seen and examined. Consent witnessed and signed. No changes noted. Update completed. BP 115/88   Pulse 98   Temp 98.5 F (36.9 C) (Oral)   Resp 20   SpO2 96%   CBC    Component Value Date/Time   WBC 10.1 03/21/2017 1020   RBC 4.72 03/21/2017 1020   HGB 13.6 03/21/2017 1020   HCT 40.6 03/21/2017 1020   PLT 316 03/21/2017 1020   MCV 86.0 03/21/2017 1020   MCH 28.8 03/21/2017 1020   MCHC 33.5 03/21/2017 1020   RDW 13.3 03/21/2017 1020   LYMPHSABS 1.3 12/05/2013 0515   MONOABS 0.6 12/05/2013 0515   EOSABS 0.0 12/05/2013 0515   BASOSABS 0.0 12/05/2013 0515

## 2017-03-30 NOTE — Anesthesia Postprocedure Evaluation (Signed)
Anesthesia Post Note  Patient: [name]  Procedure(s) Performed: ROBOTIC ASSISTED TOTAL HYSTERECTOMY WITH SALPINGECTOMY WITH INCIDENTAL CYSTOTOMY WITH REPAIR (Bilateral Abdomen)     Patient location during evaluation: PACU Anesthesia Type: General Level of consciousness: awake and alert Pain management: pain level controlled Vital Signs Assessment: post-procedure vital signs reviewed and stable Respiratory status: spontaneous breathing, nonlabored ventilation and respiratory function stable Cardiovascular status: blood pressure returned to baseline and stable Postop Assessment: no apparent nausea or vomiting Anesthetic complications: no    Last Vitals:  Vitals:   03/30/17 1730 03/30/17 1745  BP: (!) 126/91 135/82  Pulse: (!) 101 99  Resp: 17 19  Temp:  36.8 C  SpO2: 98% 96%    Last Pain:  Vitals:   03/30/17 1745  TempSrc:   PainSc: 7    Pain Goal: Patients Stated Pain Goal: 4 (03/30/17 1745)               Catalina Gravel

## 2017-03-31 ENCOUNTER — Encounter (HOSPITAL_COMMUNITY): Payer: Self-pay | Admitting: Obstetrics and Gynecology

## 2017-03-31 DIAGNOSIS — N72 Inflammatory disease of cervix uteri: Secondary | ICD-10-CM | POA: Diagnosis not present

## 2017-03-31 LAB — BASIC METABOLIC PANEL
Anion gap: 6 (ref 5–15)
BUN: 17 mg/dL (ref 6–20)
CHLORIDE: 106 mmol/L (ref 101–111)
CO2: 25 mmol/L (ref 22–32)
Calcium: 8.6 mg/dL — ABNORMAL LOW (ref 8.9–10.3)
Creatinine, Ser: 0.84 mg/dL (ref 0.44–1.00)
GFR calc non Af Amer: >60 mL/min (ref >60)
Glucose, Bld: 141 mg/dL — ABNORMAL HIGH (ref 65–99)
POTASSIUM: 4.6 mmol/L (ref 3.5–5.1)
SODIUM: 137 mmol/L (ref 135–145)

## 2017-03-31 LAB — CBC
HCT: 34.6 % — ABNORMAL LOW (ref 36.0–46.0)
HEMOGLOBIN: 11.7 g/dL — AB (ref 12.0–15.0)
MCH: 29.4 pg (ref 26.0–34.0)
MCHC: 33.8 g/dL (ref 30.0–36.0)
MCV: 86.9 fL (ref 78.0–100.0)
Platelets: 268 10*3/uL (ref 150–400)
RBC: 3.98 MIL/uL (ref 3.87–5.11)
RDW: 13.2 % (ref 11.5–15.5)
WBC: 13.1 10*3/uL — ABNORMAL HIGH (ref 4.0–10.5)

## 2017-03-31 MED ORDER — OXYCODONE-ACETAMINOPHEN 5-325 MG PO TABS: 1.0000 | ORAL_TABLET | ORAL | 0 refills | Status: DC | PRN

## 2017-03-31 MED ORDER — LEVOFLOXACIN 500 MG PO TABS: 500.0000 mg | ORAL_TABLET | Freq: Every day | ORAL | 0 refills | Status: DC

## 2017-03-31 MED ORDER — TRAMADOL HCL 50 MG PO TABS: 50.0000 mg | ORAL_TABLET | Freq: Four times a day (QID) | ORAL | 0 refills | Status: DC | PRN

## 2017-03-31 NOTE — Progress Notes (Signed)
1 Day Post-Op Procedure(s) (LRB): ROBOTIC ASSISTED TOTAL HYSTERECTOMY WITH SALPINGECTOMY WITH INCIDENTAL CYSTOTOMY WITH REPAIR (Bilateral)  Subjective: Patient reports incisional pain, tolerating PO and + flatus.    Objective: BP 116/65 (BP Location: Right Arm)   Pulse 88   Temp 98.2 F (36.8 C) (Oral)   Resp 18   Ht 5' 2"  (1.575 m)   Wt 93 kg (205 lb)   SpO2 95%   BMI 37.49 kg/m   No chief complaint on file.  CBC    Component Value Date/Time   WBC 13.1 (H) 03/31/2017 0516   RBC 3.98 03/31/2017 0516   HGB 11.7 (L) 03/31/2017 0516   HCT 34.6 (L) 03/31/2017 0516   PLT 268 03/31/2017 0516   MCV 86.9 03/31/2017 0516   MCH 29.4 03/31/2017 0516   MCHC 33.8 03/31/2017 0516   RDW 13.2 03/31/2017 0516   LYMPHSABS 1.3 12/05/2013 0515   MONOABS 0.6 12/05/2013 0515   EOSABS 0.0 12/05/2013 0515   BASOSABS 0.0 12/05/2013 0515     I have reviewed patient's vital signs, intake and output, medications and labs.  General: alert, cooperative and appears stated age Resp: clear to auscultation bilaterally Cardio: regular rate and rhythm, S1, S2 normal, no murmur, click, rub or gallop and normal apical impulse GI: soft, non-tender; bowel sounds normal; no masses,  no organomegaly and incision: clean, dry and intact Extremities: extremities normal, atraumatic, no cyanosis or edema Vaginal Bleeding: minimal  Assessment: s/p Procedure(s): ROBOTIC ASSISTED TOTAL HYSTERECTOMY WITH SALPINGECTOMY WITH INCIDENTAL CYSTOTOMY WITH REPAIR (Bilateral): stable, progressing well and tolerating diet  Plan: Advance diet Encourage ambulation Advance to PO medication Discontinue IV fluids Discharge home  LOS: 0 days    Essica Kiker J 03/31/2017, 6:00 AM

## 2017-03-31 NOTE — Progress Notes (Signed)
Discharge instructions, including urinary catheter care and leg bag teaching, reviewed with patient and her husband. Taken out on a wheelchair.

## 2017-04-04 ENCOUNTER — Encounter (HOSPITAL_COMMUNITY): Payer: Self-pay

## 2017-04-04 ENCOUNTER — Emergency Department (HOSPITAL_COMMUNITY)
Admission: EM | Admit: 2017-04-04 | Discharge: 2017-04-04 | Disposition: A | Discharge status: Home / Self Care | Payer: 59 | Attending: Emergency Medicine | Admitting: Emergency Medicine

## 2017-04-04 ENCOUNTER — Emergency Department (HOSPITAL_COMMUNITY): Payer: 59

## 2017-04-04 DIAGNOSIS — F319 Bipolar disorder, unspecified: Secondary | ICD-10-CM | POA: Insufficient documentation

## 2017-04-04 DIAGNOSIS — Z87891 Personal history of nicotine dependence: Secondary | ICD-10-CM | POA: Insufficient documentation

## 2017-04-04 DIAGNOSIS — R1030 Lower abdominal pain, unspecified: Secondary | ICD-10-CM | POA: Diagnosis present

## 2017-04-04 DIAGNOSIS — Z79899 Other long term (current) drug therapy: Secondary | ICD-10-CM | POA: Insufficient documentation

## 2017-04-04 DIAGNOSIS — G8918 Other acute postprocedural pain: Secondary | ICD-10-CM | POA: Insufficient documentation

## 2017-04-04 DIAGNOSIS — F419 Anxiety disorder, unspecified: Secondary | ICD-10-CM | POA: Insufficient documentation

## 2017-04-04 DIAGNOSIS — I509 Heart failure, unspecified: Secondary | ICD-10-CM | POA: Diagnosis not present

## 2017-04-04 DIAGNOSIS — R109 Unspecified abdominal pain: Secondary | ICD-10-CM | POA: Diagnosis not present

## 2017-04-04 DIAGNOSIS — I110 Hypertensive heart disease with heart failure: Secondary | ICD-10-CM | POA: Diagnosis not present

## 2017-04-04 LAB — CBC WITH DIFFERENTIAL/PLATELET
BASOS ABS: 0 10*3/uL (ref 0.0–0.1)
BASOS PCT: 0 %
Eosinophils Absolute: 0.2 10*3/uL (ref 0.0–0.7)
Eosinophils Relative: 2 %
HEMATOCRIT: 38.7 % (ref 36.0–46.0)
Hemoglobin: 12.8 g/dL (ref 12.0–15.0)
Lymphocytes Relative: 27 %
Lymphs Abs: 2.2 10*3/uL (ref 0.7–4.0)
MCH: 28.5 pg (ref 26.0–34.0)
MCHC: 33.1 g/dL (ref 30.0–36.0)
MCV: 86.2 fL (ref 78.0–100.0)
Monocytes Absolute: 0.4 10*3/uL (ref 0.1–1.0)
Monocytes Relative: 5 %
NEUTROS ABS: 5.3 10*3/uL (ref 1.7–7.7)
NEUTROS PCT: 66 %
Platelets: 303 10*3/uL (ref 150–400)
RBC: 4.49 MIL/uL (ref 3.87–5.11)
RDW: 12.8 % (ref 11.5–15.5)
WBC: 8.1 10*3/uL (ref 4.0–10.5)

## 2017-04-04 LAB — URINALYSIS, ROUTINE W REFLEX MICROSCOPIC
Bilirubin Urine: NEGATIVE
GLUCOSE, UA: NEGATIVE mg/dL
KETONES UR: NEGATIVE mg/dL
NITRITE: NEGATIVE
PROTEIN: 100 mg/dL — AB
Specific Gravity, Urine: 1.02 (ref 1.005–1.030)
pH: 7 (ref 5.0–8.0)

## 2017-04-04 LAB — COMPREHENSIVE METABOLIC PANEL
ALBUMIN: 3.6 g/dL (ref 3.5–5.0)
ALK PHOS: 80 U/L (ref 38–126)
ALT: 26 U/L (ref 14–54)
ANION GAP: 8 (ref 5–15)
AST: 27 U/L (ref 15–41)
BILIRUBIN TOTAL: 0.6 mg/dL (ref 0.3–1.2)
BUN: 10 mg/dL (ref 6–20)
CALCIUM: 9.2 mg/dL (ref 8.9–10.3)
CO2: 25 mmol/L (ref 22–32)
Chloride: 103 mmol/L (ref 101–111)
Creatinine, Ser: 0.86 mg/dL (ref 0.44–1.00)
GLUCOSE: 88 mg/dL (ref 65–99)
POTASSIUM: 3.8 mmol/L (ref 3.5–5.1)
Sodium: 136 mmol/L (ref 135–145)
TOTAL PROTEIN: 6.4 g/dL — AB (ref 6.5–8.1)

## 2017-04-04 LAB — POC URINE PREG, ED: Preg Test, Ur: NEGATIVE

## 2017-04-04 LAB — I-STAT CG4 LACTIC ACID, ED
Lactic Acid, Venous: 0.97 mmol/L (ref 0.5–1.9)
Lactic Acid, Venous: 0.6 mmol/L (ref 0.5–1.9)

## 2017-04-04 MED ORDER — HYDROMORPHONE HCL 1 MG/ML IJ SOLN
1.0000 mg | Freq: Once | INTRAMUSCULAR | Status: AC
  Administered 2017-04-04: 1 mg via INTRAVENOUS
  Filled 2017-04-04: qty 1

## 2017-04-04 MED ORDER — ONDANSETRON HCL 4 MG/2ML IJ SOLN
4.0000 mg | Freq: Once | INTRAMUSCULAR | Status: AC
  Administered 2017-04-04: 4 mg via INTRAVENOUS
  Filled 2017-04-04: qty 2

## 2017-04-04 MED ORDER — OXYBUTYNIN CHLORIDE 5 MG PO TABS: 5.0000 mg | ORAL_TABLET | Freq: Three times a day (TID) | ORAL | 0 refills | Status: DC

## 2017-04-04 MED ORDER — IOPAMIDOL (ISOVUE-300) INJECTION 61%
INTRAVENOUS | Status: AC
  Administered 2017-04-04: 100 mL
  Filled 2017-04-04: qty 100

## 2017-04-04 MED ORDER — KETOROLAC TROMETHAMINE 30 MG/ML IJ SOLN
30.0000 mg | Freq: Once | INTRAMUSCULAR | Status: AC
  Administered 2017-04-04: 30 mg via INTRAVENOUS
  Filled 2017-04-04: qty 1

## 2017-04-04 MED ORDER — SODIUM CHLORIDE 0.9 % IV BOLUS (SEPSIS)
1000.0000 mL | Freq: Once | INTRAVENOUS | Status: AC
  Administered 2017-04-04: 1000 mL via INTRAVENOUS

## 2017-04-04 MED ORDER — IBUPROFEN 400 MG PO TABS: 400.0000 mg | ORAL_TABLET | Freq: Three times a day (TID) | ORAL | 0 refills | Status: DC | PRN

## 2017-04-04 NOTE — ED Notes (Signed)
Patient transported to CT 

## 2017-04-04 NOTE — ED Notes (Signed)
Pt reports having hysterectomy on the 1st of Nov.  Reports her bladder is inflamed d/t IC, when the surgeon "pulled her uterus, it scraped her bladder.  He had to put 2 stitches on it" per pt's husband.  Foley catheter in place.  Pt reports worsening pain, states the percocet given to her is no longer helping the pain.  She also reports lower back pain which started today.  Pt is A&Ox 4.
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
## 2017-04-04 NOTE — Discharge Instructions (Signed)
Your workup in the emergency department today was reassuring.  We advised that you continue with your prescribed pain medication.  You may supplement this with ibuprofen, as needed.  You have also been prescribed oxybutynin for bladder spasms.  Take this as prescribed for persistent pain, as needed.  Follow-up with your OB/GYN and urologist as soon as you are able for recheck of symptoms.  You may return if symptoms persist or worsen.

## 2017-04-04 NOTE — ED Triage Notes (Signed)
Pt arrived from home c/o urinary issues after hysterectomy 03/30/17 and uncontrollable pain. Reported high fevers at home, 99.51F in triage

## 2017-04-04 NOTE — ED Notes (Signed)
Per IV team, IV is 2inch cath, ok for CT contrast.

## 2017-04-06 LAB — URINE CULTURE: CULTURE: NO GROWTH

## 2017-05-03 ENCOUNTER — Other Ambulatory Visit: Payer: Self-pay | Admitting: *Deleted

## 2017-05-03 MED ORDER — DILTIAZEM HCL ER COATED BEADS 240 MG PO CP24: 240.0000 mg | ORAL_CAPSULE | Freq: Every day | ORAL | 0 refills | Status: DC

## 2017-05-03 MED ORDER — LISINOPRIL 10 MG PO TABS: 10.0000 mg | ORAL_TABLET | Freq: Every day | ORAL | 0 refills | Status: DC

## 2017-05-11 DIAGNOSIS — D225 Melanocytic nevi of trunk: Secondary | ICD-10-CM | POA: Diagnosis not present

## 2017-05-11 DIAGNOSIS — L738 Other specified follicular disorders: Secondary | ICD-10-CM | POA: Diagnosis not present

## 2017-05-11 DIAGNOSIS — L821 Other seborrheic keratosis: Secondary | ICD-10-CM | POA: Diagnosis not present

## 2017-05-30 ENCOUNTER — Emergency Department (HOSPITAL_BASED_OUTPATIENT_CLINIC_OR_DEPARTMENT_OTHER): Payer: 59

## 2017-05-30 ENCOUNTER — Encounter (HOSPITAL_BASED_OUTPATIENT_CLINIC_OR_DEPARTMENT_OTHER): Payer: Self-pay | Admitting: Emergency Medicine

## 2017-05-30 ENCOUNTER — Other Ambulatory Visit: Payer: Self-pay

## 2017-05-30 ENCOUNTER — Emergency Department (HOSPITAL_BASED_OUTPATIENT_CLINIC_OR_DEPARTMENT_OTHER)
Admission: EM | Admit: 2017-05-30 | Discharge: 2017-05-30 | Disposition: A | Discharge status: Home / Self Care | Payer: 59 | Attending: Emergency Medicine | Admitting: Emergency Medicine

## 2017-05-30 DIAGNOSIS — M7918 Myalgia, other site: Secondary | ICD-10-CM | POA: Insufficient documentation

## 2017-05-30 DIAGNOSIS — I509 Heart failure, unspecified: Secondary | ICD-10-CM | POA: Diagnosis not present

## 2017-05-30 DIAGNOSIS — J3489 Other specified disorders of nose and nasal sinuses: Secondary | ICD-10-CM | POA: Diagnosis not present

## 2017-05-30 DIAGNOSIS — R69 Illness, unspecified: Secondary | ICD-10-CM

## 2017-05-30 DIAGNOSIS — I110 Hypertensive heart disease with heart failure: Secondary | ICD-10-CM | POA: Insufficient documentation

## 2017-05-30 DIAGNOSIS — Z79899 Other long term (current) drug therapy: Secondary | ICD-10-CM | POA: Insufficient documentation

## 2017-05-30 DIAGNOSIS — R6883 Chills (without fever): Secondary | ICD-10-CM | POA: Insufficient documentation

## 2017-05-30 DIAGNOSIS — R0602 Shortness of breath: Secondary | ICD-10-CM | POA: Diagnosis not present

## 2017-05-30 DIAGNOSIS — J111 Influenza due to unidentified influenza virus with other respiratory manifestations: Secondary | ICD-10-CM | POA: Diagnosis not present

## 2017-05-30 DIAGNOSIS — R05 Cough: Secondary | ICD-10-CM | POA: Diagnosis not present

## 2017-05-30 DIAGNOSIS — J029 Acute pharyngitis, unspecified: Secondary | ICD-10-CM | POA: Diagnosis not present

## 2017-05-30 DIAGNOSIS — Z87891 Personal history of nicotine dependence: Secondary | ICD-10-CM | POA: Insufficient documentation

## 2017-05-30 DIAGNOSIS — R0981 Nasal congestion: Secondary | ICD-10-CM | POA: Diagnosis not present

## 2017-05-30 DIAGNOSIS — R531 Weakness: Secondary | ICD-10-CM | POA: Diagnosis not present

## 2017-05-30 LAB — RAPID STREP SCREEN (MED CTR MEBANE ONLY): Streptococcus, Group A Screen (Direct): NEGATIVE

## 2017-05-30 MED ORDER — IPRATROPIUM-ALBUTEROL 0.5-2.5 (3) MG/3ML IN SOLN
3.0000 mL | Freq: Four times a day (QID) | RESPIRATORY_TRACT | Status: DC
  Administered 2017-05-30: 3 mL via RESPIRATORY_TRACT
  Filled 2017-05-30: qty 3

## 2017-05-30 MED ORDER — BENZONATATE 200 MG PO CAPS: 200.0000 mg | ORAL_CAPSULE | Freq: Three times a day (TID) | ORAL | 0 refills | Status: DC | PRN

## 2017-05-30 MED ORDER — OSELTAMIVIR PHOSPHATE 75 MG PO CAPS
75.0000 mg | ORAL_CAPSULE | Freq: Once | ORAL | Status: AC
  Administered 2017-05-30: 75 mg via ORAL
  Filled 2017-05-30: qty 1

## 2017-05-30 MED ORDER — OSELTAMIVIR PHOSPHATE 75 MG PO CAPS: 75.0000 mg | ORAL_CAPSULE | Freq: Two times a day (BID) | ORAL | 0 refills | Status: DC

## 2017-05-30 NOTE — ED Triage Notes (Addendum)
Pt presents with flu like symptoms that started today. PT states her husband was diagnosed yesterday with flu. Pt states she feel like she has a sinus infection, body aches, weakness, sore throat, and cough.
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
## 2017-05-30 NOTE — Discharge Instructions (Signed)
Take Tamiflu as directed. Take Tessalon Perles as needed for cough. Take Tylenol or ibuprofen as needed for body aches and fever. Continue your home medications as previously prescribed. Push fluids to maintain adequate hydration. Return to ED for worsening symptoms, trouble breathing, trouble swallowing, chest pain, lightheadedness or loss of consciousness.

## 2017-06-02 LAB — CULTURE, GROUP A STREP (THRC)

## 2017-06-07 ENCOUNTER — Telehealth: Payer: Self-pay | Admitting: *Deleted

## 2017-06-07 NOTE — Telephone Encounter (Signed)
Refill request for zofran. Dr. Paulla Dolly states if pt is having problems with nausea she needs to be seen by her PCP. Return fax denying.

## 2017-06-08 DIAGNOSIS — N3010 Interstitial cystitis (chronic) without hematuria: Secondary | ICD-10-CM | POA: Diagnosis not present

## 2017-06-08 DIAGNOSIS — G8929 Other chronic pain: Secondary | ICD-10-CM | POA: Diagnosis not present

## 2017-06-08 DIAGNOSIS — K582 Mixed irritable bowel syndrome: Secondary | ICD-10-CM | POA: Diagnosis not present

## 2017-06-08 DIAGNOSIS — M797 Fibromyalgia: Secondary | ICD-10-CM | POA: Diagnosis not present

## 2017-06-08 DIAGNOSIS — N9489 Other specified conditions associated with female genital organs and menstrual cycle: Secondary | ICD-10-CM | POA: Diagnosis not present

## 2017-07-07 ENCOUNTER — Ambulatory Visit (INDEPENDENT_AMBULATORY_CARE_PROVIDER_SITE_OTHER): Payer: 59 | Admitting: Cardiovascular Disease

## 2017-07-07 ENCOUNTER — Encounter: Payer: Self-pay | Admitting: Cardiovascular Disease

## 2017-07-07 VITALS — BP 120/76 | HR 91 | Ht 62.0 in | Wt 195.1 lb

## 2017-07-07 DIAGNOSIS — I10 Essential (primary) hypertension: Secondary | ICD-10-CM | POA: Diagnosis not present

## 2017-07-07 DIAGNOSIS — E782 Mixed hyperlipidemia: Secondary | ICD-10-CM | POA: Diagnosis not present

## 2017-07-07 LAB — HEPATIC FUNCTION PANEL
ALBUMIN: 4.5 g/dL (ref 3.5–5.5)
ALK PHOS: 107 IU/L (ref 39–117)
ALT: 26 IU/L (ref 0–32)
AST: 23 IU/L (ref 0–40)
BILIRUBIN TOTAL: 0.6 mg/dL (ref 0.0–1.2)
BILIRUBIN, DIRECT: 0.2 mg/dL (ref 0.00–0.40)
TOTAL PROTEIN: 6.8 g/dL (ref 6.0–8.5)

## 2017-07-07 LAB — BASIC METABOLIC PANEL
BUN/Creatinine Ratio: 25 — ABNORMAL HIGH (ref 9–23)
BUN: 17 mg/dL (ref 6–24)
CALCIUM: 9.5 mg/dL (ref 8.7–10.2)
CO2: 20 mmol/L (ref 20–29)
CREATININE: 0.68 mg/dL (ref 0.57–1.00)
Chloride: 104 mmol/L (ref 96–106)
GFR calc Af Amer: 123 mL/min/{1.73_m2} (ref >59)
GFR, EST NON AFRICAN AMERICAN: 107 mL/min/{1.73_m2} (ref >59)
GLUCOSE: 95 mg/dL (ref 65–99)
Potassium: 4.2 mmol/L (ref 3.5–5.2)
Sodium: 138 mmol/L (ref 134–144)

## 2017-07-07 LAB — LIPID PANEL
Chol/HDL Ratio: 3.4 ratio (ref 0.0–4.4)
Cholesterol, Total: 223 mg/dL — ABNORMAL HIGH (ref 100–199)
HDL: 66 mg/dL (ref >39)
LDL Calculated: 143 mg/dL — ABNORMAL HIGH (ref 0–99)
Triglycerides: 69 mg/dL (ref 0–149)
VLDL Cholesterol Cal: 14 mg/dL (ref 5–40)
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
## 2017-07-07 NOTE — Patient Instructions (Signed)
Medication Instructions:  Your physician recommends that you continue on your current medications as directed. Please refer to the Current Medication list given to you today.   Labwork: TODAY - liver panel, cholesterol, basic metabolic panel   Testing/Procedures: None Ordered   Follow-Up: Your physician wants you to follow-up in: 1 year with Dr. Acie Fredrickson.  You will receive a reminder letter in the mail two months in advance. If you don't receive a letter, please call our office to schedule the follow-up appointment.   If you need a refill on your cardiac medications before your next appointment, please call your pharmacy.   Thank you for choosing [facility]! Christen Bame, RN [phone]

## 2017-07-10 ENCOUNTER — Telehealth: Payer: Self-pay | Admitting: Nurse Practitioner

## 2017-07-10 DIAGNOSIS — E782 Mixed hyperlipidemia: Secondary | ICD-10-CM

## 2017-07-10 MED ORDER — ATORVASTATIN CALCIUM 40 MG PO TABS: 40.0000 mg | ORAL_TABLET | Freq: Every day | ORAL | 3 refills | Status: DC

## 2017-07-10 NOTE — Telephone Encounter (Signed)
-----   Message from Thayer Headings, MD sent at 07/07/2017  5:07 PM EST ----- Chol levels are elevated.  Please start Atorvastatin 40 mg a day  Check fast lipids, liver, BMP in 3 month s

## 2017-07-10 NOTE — Telephone Encounter (Signed)
Reviewed lab results and plan of care with patient who verbalized understanding and agreement. I scheduled her for lab work in May and advised her to call back if she has questions or concerns once she starts Atorvastatin. She asks what her LDL goal is and I advised that since she does not have known CAD, her goal is 90 mg/dL. She states her mother had bypass surgery and her brother died of a massive heart attack. I reviewed signs of cardiac chest pain with her and advised her to call back with questions or concerns. I also advised her to call if she has any concerns once she starts the atorvastatin. She verbalized understanding and agreement with plan and thanked me for the call.

## 2017-08-03 ENCOUNTER — Other Ambulatory Visit: Payer: Self-pay | Admitting: Cardiovascular Disease

## 2017-09-01 DIAGNOSIS — R062 Wheezing: Secondary | ICD-10-CM | POA: Diagnosis not present

## 2017-09-01 DIAGNOSIS — H6503 Acute serous otitis media, bilateral: Secondary | ICD-10-CM | POA: Diagnosis not present

## 2017-09-01 DIAGNOSIS — J209 Acute bronchitis, unspecified: Secondary | ICD-10-CM | POA: Diagnosis not present

## 2017-09-12 DIAGNOSIS — N3010 Interstitial cystitis (chronic) without hematuria: Secondary | ICD-10-CM | POA: Diagnosis not present

## 2017-09-16 DIAGNOSIS — S838X1A Sprain of other specified parts of right knee, initial encounter: Secondary | ICD-10-CM | POA: Diagnosis not present

## 2017-10-03 DIAGNOSIS — M461 Sacroiliitis, not elsewhere classified: Secondary | ICD-10-CM | POA: Diagnosis not present

## 2017-10-03 DIAGNOSIS — Z6832 Body mass index (BMI) 32.0-32.9, adult: Secondary | ICD-10-CM | POA: Diagnosis not present

## 2017-10-03 DIAGNOSIS — I10 Essential (primary) hypertension: Secondary | ICD-10-CM | POA: Diagnosis not present

## 2017-10-09 ENCOUNTER — Other Ambulatory Visit: Payer: 59

## 2017-10-19 DIAGNOSIS — J189 Pneumonia, unspecified organism: Secondary | ICD-10-CM | POA: Diagnosis not present

## 2017-10-19 DIAGNOSIS — R062 Wheezing: Secondary | ICD-10-CM | POA: Diagnosis not present

## 2017-10-25 DIAGNOSIS — R030 Elevated blood-pressure reading, without diagnosis of hypertension: Secondary | ICD-10-CM | POA: Diagnosis not present

## 2017-10-25 DIAGNOSIS — M47816 Spondylosis without myelopathy or radiculopathy, lumbar region: Secondary | ICD-10-CM | POA: Diagnosis not present

## 2017-10-25 DIAGNOSIS — Z6835 Body mass index (BMI) 35.0-35.9, adult: Secondary | ICD-10-CM | POA: Diagnosis not present

## 2017-11-01 ENCOUNTER — Encounter (HOSPITAL_BASED_OUTPATIENT_CLINIC_OR_DEPARTMENT_OTHER): Payer: Self-pay | Admitting: Emergency Medicine

## 2017-11-01 ENCOUNTER — Emergency Department (HOSPITAL_BASED_OUTPATIENT_CLINIC_OR_DEPARTMENT_OTHER)
Admission: EM | Admit: 2017-11-01 | Discharge: 2017-11-01 | Disposition: A | Discharge status: Home / Self Care | Payer: 59 | Attending: Emergency Medicine | Admitting: Emergency Medicine

## 2017-11-01 ENCOUNTER — Other Ambulatory Visit: Payer: Self-pay

## 2017-11-01 DIAGNOSIS — Z79899 Other long term (current) drug therapy: Secondary | ICD-10-CM | POA: Insufficient documentation

## 2017-11-01 DIAGNOSIS — I10 Essential (primary) hypertension: Secondary | ICD-10-CM | POA: Insufficient documentation

## 2017-11-01 DIAGNOSIS — Z87891 Personal history of nicotine dependence: Secondary | ICD-10-CM | POA: Diagnosis not present

## 2017-11-01 DIAGNOSIS — R21 Rash and other nonspecific skin eruption: Secondary | ICD-10-CM | POA: Insufficient documentation

## 2017-11-01 DIAGNOSIS — M2550 Pain in unspecified joint: Secondary | ICD-10-CM

## 2017-11-01 DIAGNOSIS — M25561 Pain in right knee: Secondary | ICD-10-CM | POA: Insufficient documentation

## 2017-11-01 DIAGNOSIS — M25572 Pain in left ankle and joints of left foot: Secondary | ICD-10-CM | POA: Insufficient documentation

## 2017-11-01 DIAGNOSIS — M25571 Pain in right ankle and joints of right foot: Secondary | ICD-10-CM | POA: Diagnosis not present

## 2017-11-01 DIAGNOSIS — M25562 Pain in left knee: Secondary | ICD-10-CM | POA: Diagnosis not present

## 2017-11-01 HISTORY — DX: Calculus of kidney: N20.0

## 2017-11-01 MED ORDER — OXYCODONE-ACETAMINOPHEN 5-325 MG PO TABS
1.0000 | ORAL_TABLET | Freq: Once | ORAL | Status: AC
  Administered 2017-11-01: 1 via ORAL
  Filled 2017-11-01: qty 1

## 2017-11-01 MED ORDER — NAPROXEN 500 MG PO TABS: ORAL_TABLET | ORAL | 0 refills | Status: DC

## 2017-11-01 MED ORDER — KETOROLAC TROMETHAMINE 30 MG/ML IJ SOLN
30.0000 mg | Freq: Once | INTRAMUSCULAR | Status: AC
  Administered 2017-11-01: 30 mg via INTRAMUSCULAR
  Filled 2017-11-01: qty 1

## 2017-11-01 MED ORDER — HYDROCORTISONE 1 % EX CREA
TOPICAL_CREAM | Freq: Two times a day (BID) | CUTANEOUS | Status: DC
  Administered 2017-11-01: 07:00:00 via TOPICAL
  Filled 2017-11-01: qty 28
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
## 2017-11-01 NOTE — ED Triage Notes (Addendum)
"   My ankles are hurting really bad x  1 hr ago and I have a rash to my arms since last night" Took oxycodone about 45 min ago, no obvious injury noted to ankles

## 2017-11-01 NOTE — ED Notes (Signed)
ED Provider at bedside. 

## 2017-12-25 DIAGNOSIS — L500 Allergic urticaria: Secondary | ICD-10-CM | POA: Diagnosis not present

## 2018-01-26 DIAGNOSIS — N10 Acute tubulo-interstitial nephritis: Secondary | ICD-10-CM | POA: Diagnosis not present

## 2018-02-14 DIAGNOSIS — J209 Acute bronchitis, unspecified: Secondary | ICD-10-CM | POA: Diagnosis not present

## 2018-02-16 DIAGNOSIS — M461 Sacroiliitis, not elsewhere classified: Secondary | ICD-10-CM | POA: Diagnosis not present

## 2018-02-27 DIAGNOSIS — J019 Acute sinusitis, unspecified: Secondary | ICD-10-CM | POA: Diagnosis not present

## 2018-03-29 DIAGNOSIS — M797 Fibromyalgia: Secondary | ICD-10-CM | POA: Diagnosis not present

## 2018-03-29 DIAGNOSIS — N9489 Other specified conditions associated with female genital organs and menstrual cycle: Secondary | ICD-10-CM | POA: Diagnosis not present

## 2018-03-29 DIAGNOSIS — N3010 Interstitial cystitis (chronic) without hematuria: Secondary | ICD-10-CM | POA: Diagnosis not present

## 2018-05-01 DIAGNOSIS — M461 Sacroiliitis, not elsewhere classified: Secondary | ICD-10-CM | POA: Diagnosis not present

## 2018-05-09 DIAGNOSIS — M47816 Spondylosis without myelopathy or radiculopathy, lumbar region: Secondary | ICD-10-CM | POA: Diagnosis not present

## 2018-05-09 DIAGNOSIS — Z6837 Body mass index (BMI) 37.0-37.9, adult: Secondary | ICD-10-CM | POA: Diagnosis not present

## 2018-06-11 ENCOUNTER — Telehealth: Payer: Self-pay | Admitting: Cardiovascular Disease

## 2018-06-11 NOTE — Telephone Encounter (Signed)
Error

## 2018-06-22 DIAGNOSIS — M461 Sacroiliitis, not elsewhere classified: Secondary | ICD-10-CM | POA: Diagnosis not present

## 2018-06-22 DIAGNOSIS — M47816 Spondylosis without myelopathy or radiculopathy, lumbar region: Secondary | ICD-10-CM | POA: Diagnosis not present

## 2018-06-22 DIAGNOSIS — M797 Fibromyalgia: Secondary | ICD-10-CM | POA: Diagnosis not present

## 2018-07-10 DIAGNOSIS — N9489 Other specified conditions associated with female genital organs and menstrual cycle: Secondary | ICD-10-CM | POA: Diagnosis not present

## 2018-07-10 DIAGNOSIS — N3010 Interstitial cystitis (chronic) without hematuria: Secondary | ICD-10-CM | POA: Diagnosis not present

## 2018-07-10 DIAGNOSIS — M797 Fibromyalgia: Secondary | ICD-10-CM | POA: Diagnosis not present

## 2018-07-13 IMAGING — XA DG MYELOGRAPHY LUMBAR INJ LUMBOSACRAL
14 of 19 series · 14 of 19 positions shown · non-contrast
Comparison: Noncontrast CT scan 02/15/2016. Lumbar CT myelogram
05/12/2011.

CLINICAL DATA: Severe low back pain.
TECHNIQUE: Contiguous axial images were obtained through the Lumbar spine after
the intrathecal infusion of infusion. Coronal and sagittal
reconstructions were obtained of the axial image sets.

[Series 1: ortho standard · 1 of 1 slices shown (1 of 2)]
[im 1/1]
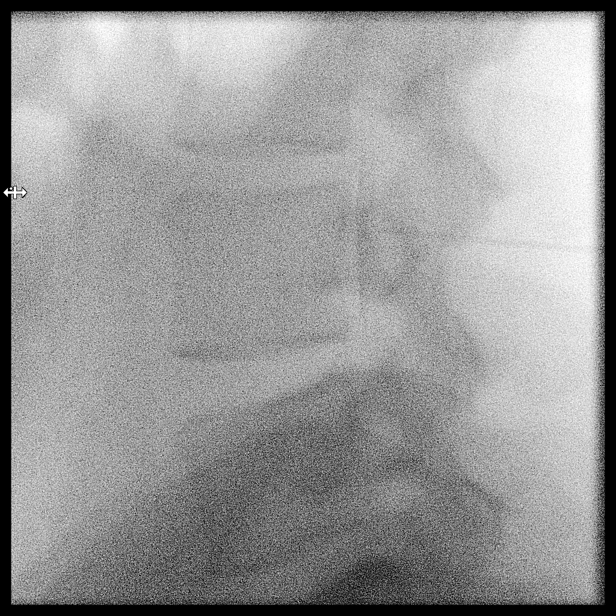

[Series 2: w lumbar spine lat · 0.15mm/px · 1 of 1 slices shown]
[im 1/1]
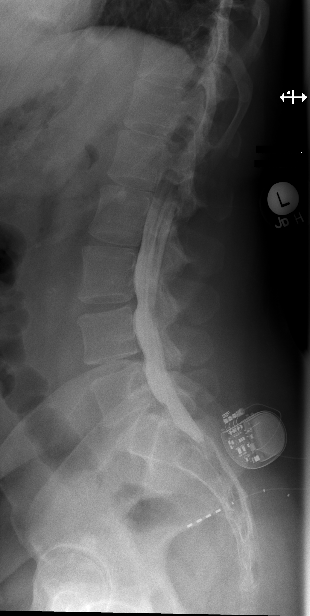

[Series 2: ortho standard · 1 of 1 slices shown (2 of 2)]
[im 1/1]
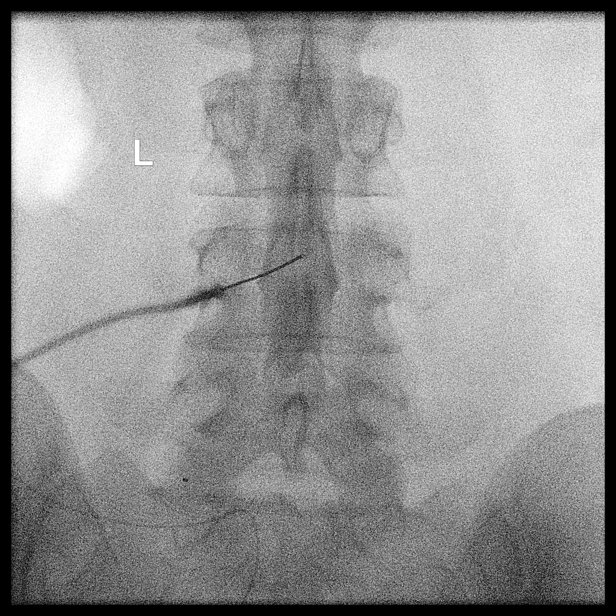

[Series 3: vasc adipose · 1 of 1 slices shown (1 of 10)]
[im 1/1]
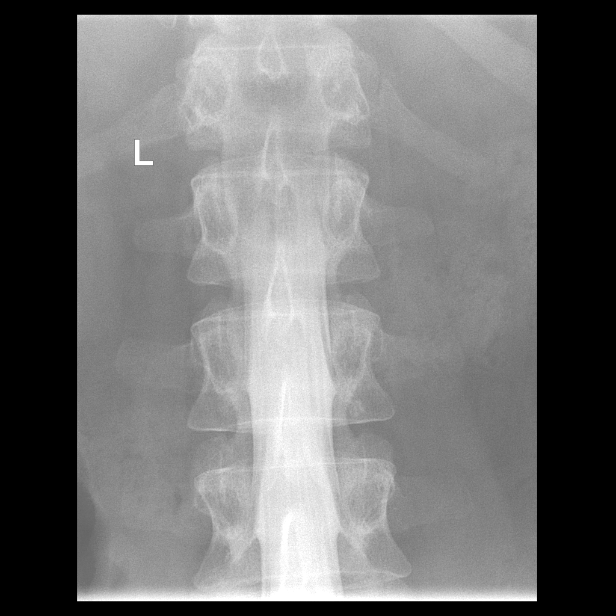

[Series 4: vasc adipose · 1 of 1 slices shown (2 of 10)]
[im 1/1]
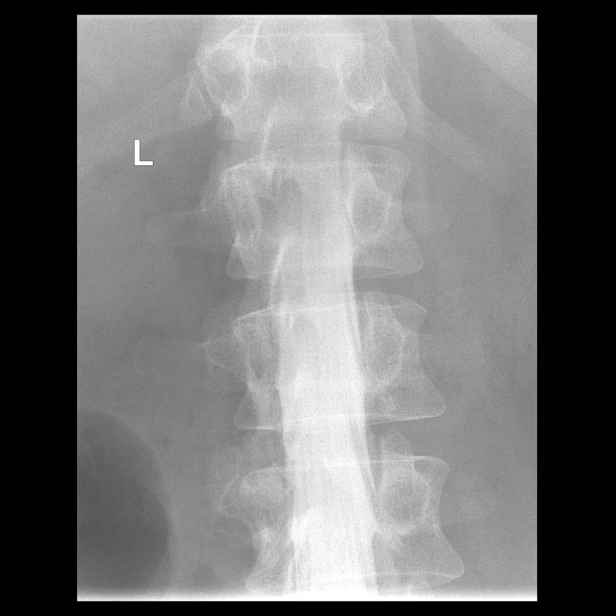

[Series 4: w lumbar spine extension · 0.15mm/px · 1 of 1 slices shown]
[im 1/1]
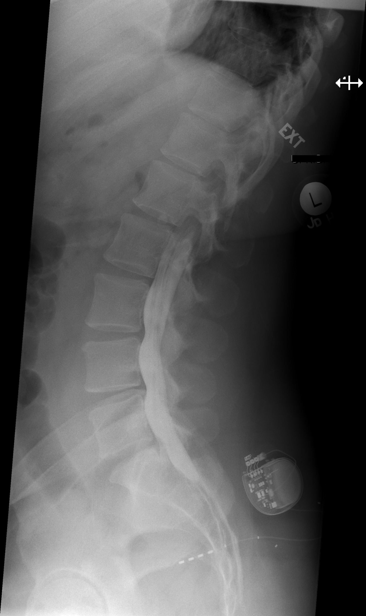

[Series 5: vasc adipose · 1 of 1 slices shown (3 of 10)]
[im 1/1]
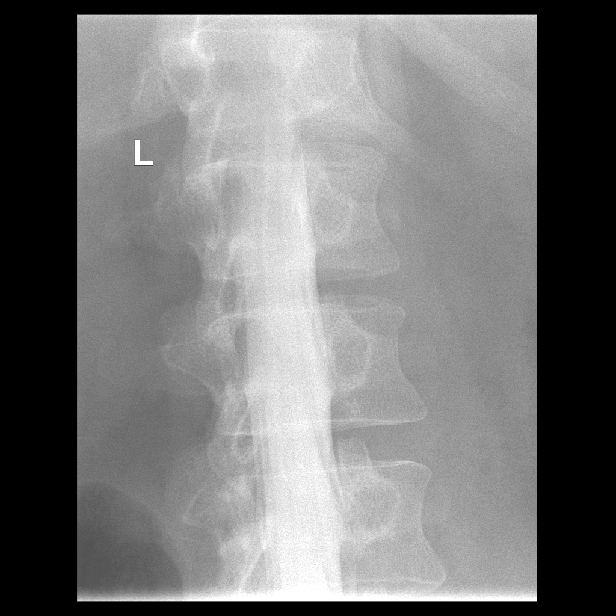

[Series 7: vasc adipose · 1 of 1 slices shown (4 of 10)]
[im 1/1]
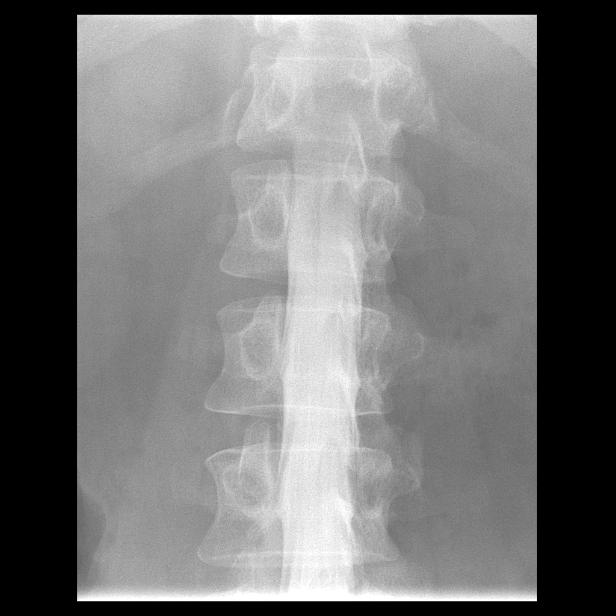

[Series 8: vasc adipose · 1 of 1 slices shown (5 of 10)]
[im 1/1]
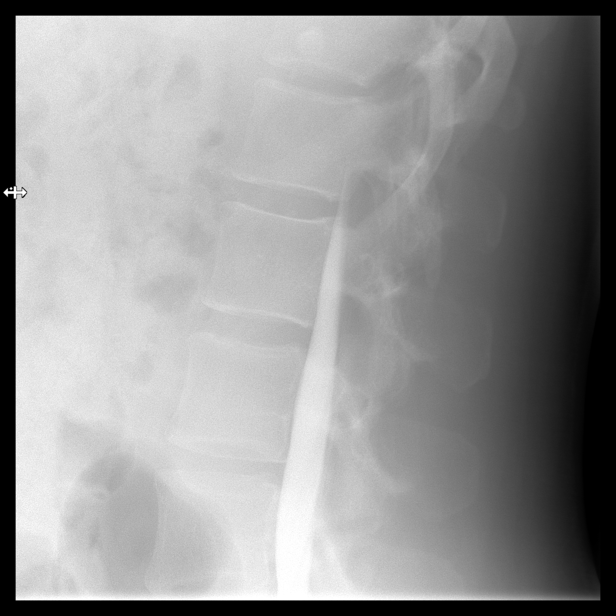

[Series 9: vasc adipose · 1 of 1 slices shown (6 of 10)]
[im 1/1]
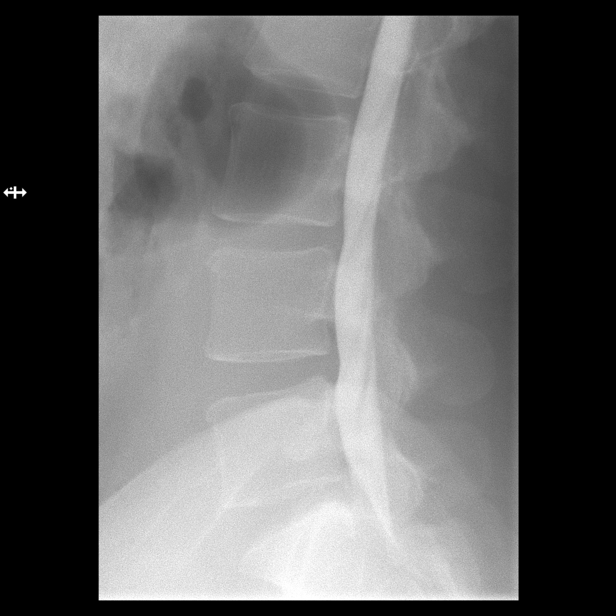

[Series 11: vasc adipose · 1 of 1 slices shown (7 of 10)]
[im 1/1]
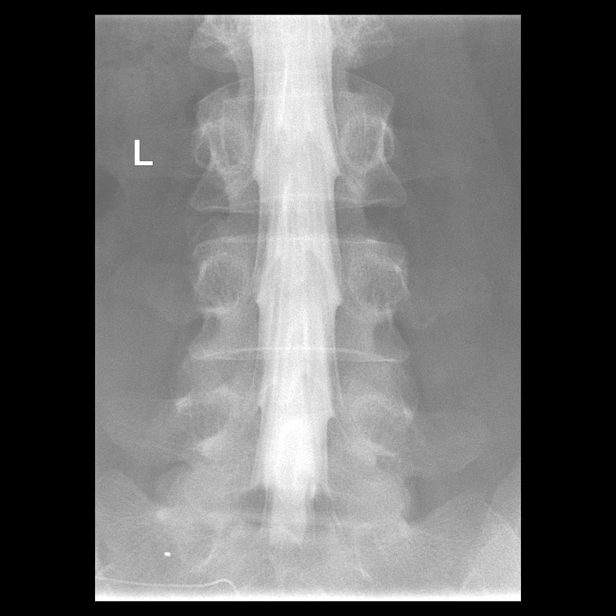

[Series 12: vasc adipose · 1 of 1 slices shown (8 of 10)]
[im 1/1]
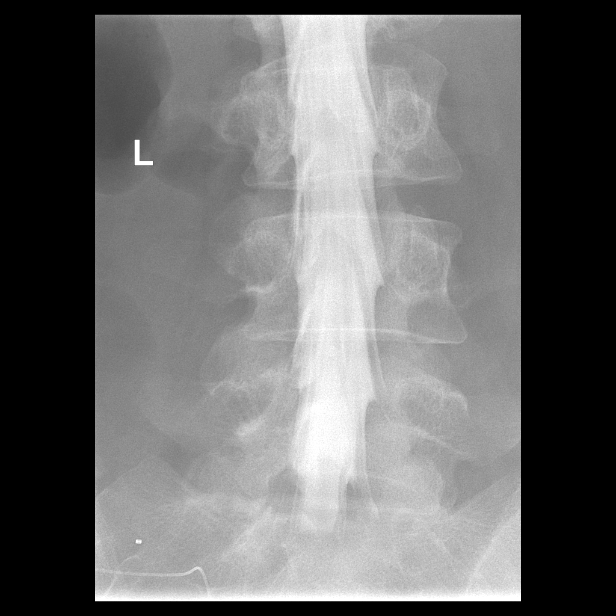

[Series 13: vasc adipose · 1 of 1 slices shown (9 of 10)]
[im 1/1]
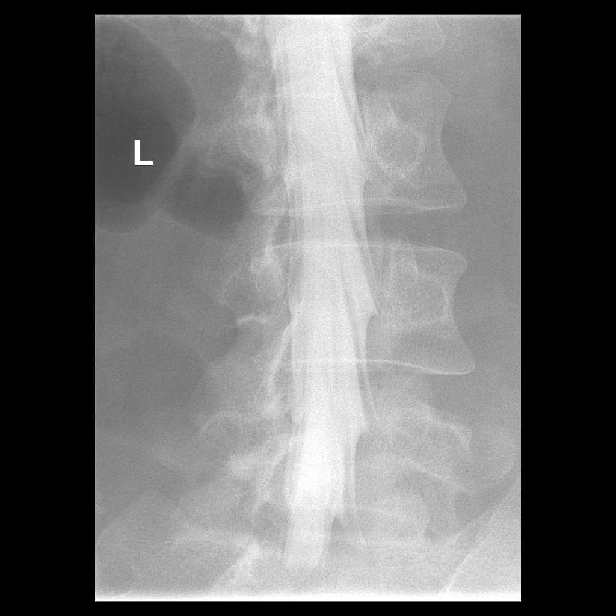

[Series 15: vasc adipose · 1 of 1 slices shown (10 of 10)]
[im 1/1]
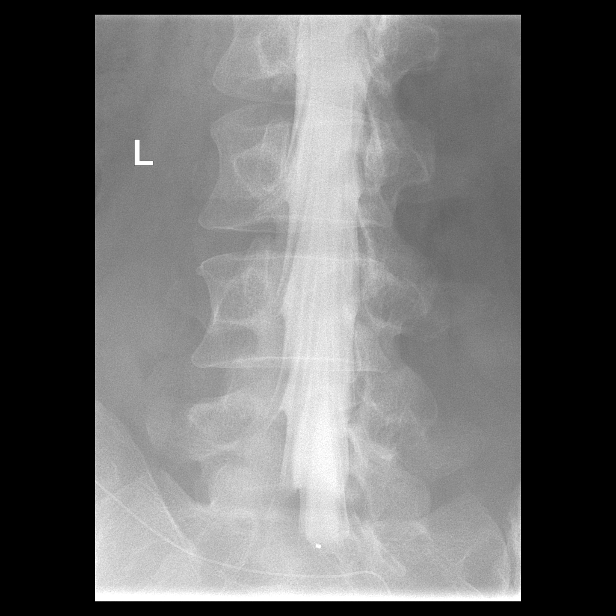

[14 of 19 positions shown; findings below may reference images not displayed]

EXAM:
LUMBAR MYELOGRAM

FLUOROSCOPY TIME:  15 seconds corresponding to a Dose Area Product
of 203 ?Gy*m2

PROCEDURE:
After thorough discussion of risks and benefits of the procedure
including bleeding, infection, injury to nerves, blood vessels,
adjacent structures as well as headache and CSF leak, written and
oral informed consent was obtained. Consent was obtained by Dr. Ania
Mahamud. Time out form was completed.

Patient was positioned prone on the fluoroscopy table. Local
anesthesia was provided with 1% lidocaine without epinephrine after
prepped and draped in the usual sterile fashion. Puncture was
performed at L3-L4 using a 3 1/2 inch 22-gauge spinal needle via
midline approach. Using a single pass through the dura, the needle
was placed within the thecal sac, with return of clear CSF. 15 mL of
Isovue-M 200 was injected into the thecal sac, with normal
opacification of the nerve roots and cauda equina consistent with
free flow within the subarachnoid space.

I personally performed the lumbar puncture and administered the
intrathecal contrast. I also personally supervised acquisition of
the myelogram images.
FINDINGS: LUMBAR MYELOGRAM FINDINGS:

Good opacification lumbar subarachnoid space. Slight ventral defect
L4-5 and L5-S1. No nerve root cut off or spinal stenosis.

Anatomic alignment without dynamic instability through standing
flexion extension.

Incidental bladder stimulator, with leads intact.

CT LUMBAR MYELOGRAM FINDINGS:

Segmentation: Normal.

Alignment:  Normal.

Vertebrae: No worrisome osseous lesion.

Conus medullaris: Normal in size, signal, and location.

Paraspinal tissues: No evidence for hydronephrosis or paravertebral
mass. Nephrolithiasis.

Bladder stimulator lead passes through LEFT S3 sacral foramen, and
terminates in the soft tissues adjacent to the piriformis.

Disc levels:

No disc protrusion or spinal stenosis.

Slight annular bulge L4-5 and L5-S1 without stenosis or neural
impingement. Annular calcification at L3-4 on the LEFT, without
significant foraminal or extraforaminal protrusion.

Compared with prior CT myelogram, the foraminal protrusion at L3-4
has regressed.
IMPRESSION: LUMBAR MYELOGRAM IMPRESSION:

No nerve root cut off or spinal stenosis.

Anatomic alignment without dynamic instability.

CT LUMBAR MYELOGRAM IMPRESSION:

Annular bulges L4-5 and L5-S1, noncompressive.

## 2018-07-19 DIAGNOSIS — Z6837 Body mass index (BMI) 37.0-37.9, adult: Secondary | ICD-10-CM | POA: Diagnosis not present

## 2018-07-19 DIAGNOSIS — I509 Heart failure, unspecified: Secondary | ICD-10-CM | POA: Insufficient documentation

## 2018-07-19 DIAGNOSIS — G40909 Epilepsy, unspecified, not intractable, without status epilepticus: Secondary | ICD-10-CM | POA: Insufficient documentation

## 2018-07-19 DIAGNOSIS — Z7689 Persons encountering health services in other specified circumstances: Secondary | ICD-10-CM | POA: Diagnosis not present

## 2018-07-24 ENCOUNTER — Encounter: Payer: Self-pay | Admitting: Cardiovascular Disease

## 2018-07-24 ENCOUNTER — Ambulatory Visit (INDEPENDENT_AMBULATORY_CARE_PROVIDER_SITE_OTHER): Payer: 59 | Admitting: Cardiovascular Disease

## 2018-07-24 VITALS — BP 110/62 | HR 73 | Ht 63.0 in | Wt 207.1 lb

## 2018-07-24 DIAGNOSIS — I10 Essential (primary) hypertension: Secondary | ICD-10-CM

## 2018-07-24 DIAGNOSIS — E782 Mixed hyperlipidemia: Secondary | ICD-10-CM

## 2018-07-24 LAB — HEPATIC FUNCTION PANEL
ALBUMIN: 4 g/dL (ref 3.8–4.8)
ALK PHOS: 107 IU/L (ref 39–117)
ALT: 17 IU/L (ref 0–32)
AST: 16 IU/L (ref 0–40)
Bilirubin Total: 0.4 mg/dL (ref 0.0–1.2)
Bilirubin, Direct: 0.14 mg/dL (ref 0.00–0.40)
TOTAL PROTEIN: 6.4 g/dL (ref 6.0–8.5)

## 2018-07-24 LAB — BASIC METABOLIC PANEL
BUN/Creatinine Ratio: 24 — ABNORMAL HIGH (ref 9–23)
BUN: 17 mg/dL (ref 6–24)
CHLORIDE: 100 mmol/L (ref 96–106)
CO2: 25 mmol/L (ref 20–29)
CREATININE: 0.71 mg/dL (ref 0.57–1.00)
Calcium: 9.1 mg/dL (ref 8.7–10.2)
GFR calc Af Amer: 119 mL/min/{1.73_m2} (ref >59)
GFR calc non Af Amer: 103 mL/min/{1.73_m2} (ref >59)
GLUCOSE: 102 mg/dL — AB (ref 65–99)
Potassium: 4.5 mmol/L (ref 3.5–5.2)
SODIUM: 137 mmol/L (ref 134–144)

## 2018-07-24 LAB — LIPID PANEL
CHOL/HDL RATIO: 2.6 ratio (ref 0.0–4.4)
Cholesterol, Total: 183 mg/dL (ref 100–199)
HDL: 70 mg/dL (ref >39)
LDL Calculated: 102 mg/dL — ABNORMAL HIGH (ref 0–99)
Triglycerides: 56 mg/dL (ref 0–149)
VLDL CHOLESTEROL CAL: 11 mg/dL (ref 5–40)

## 2018-07-24 MED ORDER — ATORVASTATIN CALCIUM 40 MG PO TABS: 40.0000 mg | ORAL_TABLET | Freq: Every day | ORAL | 3 refills | Status: DC

## 2018-07-24 MED ORDER — LISINOPRIL 10 MG PO TABS: 10.0000 mg | ORAL_TABLET | Freq: Every day | ORAL | 3 refills | Status: DC

## 2018-07-24 NOTE — Progress Notes (Signed)
EKG

## 2018-07-24 NOTE — Patient Instructions (Signed)
Medication Instructions:  Your physician has recommended you make the following change in your medication:  YOU MAY STOP Cardizem  Monitor BP for about 2 weeks after stopping and call back to let us know readings   If you need a refill on your cardiac medications before your next appointment, please call your pharmacy.   Lab work: TODAY - cholesterol, liver panel, basic metabolic panel  If you have labs (blood work) drawn today and your tests are completely normal, you will receive your results only by: Marland Kitchen MyChart Message (if you have MyChart) OR . A paper copy in the mail If you have any lab test that is abnormal or we need to change your treatment, we will call you to review the results.   Testing/Procedures: None Ordered   Follow-Up: At [facility], you and your health needs are our priority.  As part of our continuing mission to provide you with exceptional heart care, we have created designated Provider Care Teams.  These Care Teams include your primary Cardiologist (physician) and Advanced Practice Providers (APPs -  Physician Assistants and Nurse Practitioners) who all work together to provide you with the care you need, when you need it. You will need a follow up appointment in:  1 years.  Please call our office 2 months in advance to schedule this appointment.  You may see Mertie Moores, MD or one of the following Advanced Practice Providers on your designated Care Team: Richardson Dopp, PA-C [place], [place] . Daune Perch, NP
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
## 2018-07-27 ENCOUNTER — Encounter: Payer: Self-pay | Admitting: Cardiovascular Disease

## 2018-08-09 DIAGNOSIS — Z01419 Encounter for gynecological examination (general) (routine) without abnormal findings: Secondary | ICD-10-CM | POA: Diagnosis not present

## 2018-08-09 DIAGNOSIS — Z1231 Encounter for screening mammogram for malignant neoplasm of breast: Secondary | ICD-10-CM | POA: Diagnosis not present

## 2018-08-19 ENCOUNTER — Other Ambulatory Visit: Payer: Self-pay

## 2018-08-19 ENCOUNTER — Emergency Department (HOSPITAL_BASED_OUTPATIENT_CLINIC_OR_DEPARTMENT_OTHER)
Admission: EM | Admit: 2018-08-19 | Discharge: 2018-08-19 | Disposition: A | Discharge status: Home / Self Care | Payer: 59 | Attending: Emergency Medicine | Admitting: Emergency Medicine

## 2018-08-19 ENCOUNTER — Encounter (HOSPITAL_BASED_OUTPATIENT_CLINIC_OR_DEPARTMENT_OTHER): Payer: Self-pay | Admitting: *Deleted

## 2018-08-19 ENCOUNTER — Emergency Department (HOSPITAL_BASED_OUTPATIENT_CLINIC_OR_DEPARTMENT_OTHER): Payer: 59

## 2018-08-19 DIAGNOSIS — K828 Other specified diseases of gallbladder: Secondary | ICD-10-CM | POA: Diagnosis not present

## 2018-08-19 DIAGNOSIS — Z87891 Personal history of nicotine dependence: Secondary | ICD-10-CM | POA: Insufficient documentation

## 2018-08-19 DIAGNOSIS — I509 Heart failure, unspecified: Secondary | ICD-10-CM | POA: Insufficient documentation

## 2018-08-19 DIAGNOSIS — Z79899 Other long term (current) drug therapy: Secondary | ICD-10-CM | POA: Diagnosis not present

## 2018-08-19 DIAGNOSIS — R102 Pelvic and perineal pain: Secondary | ICD-10-CM

## 2018-08-19 DIAGNOSIS — I110 Hypertensive heart disease with heart failure: Secondary | ICD-10-CM | POA: Insufficient documentation

## 2018-08-19 DIAGNOSIS — R1084 Generalized abdominal pain: Secondary | ICD-10-CM | POA: Diagnosis not present

## 2018-08-19 DIAGNOSIS — R1032 Left lower quadrant pain: Secondary | ICD-10-CM | POA: Diagnosis present

## 2018-08-19 DIAGNOSIS — K5900 Constipation, unspecified: Secondary | ICD-10-CM | POA: Diagnosis not present

## 2018-08-19 DIAGNOSIS — N200 Calculus of kidney: Secondary | ICD-10-CM | POA: Diagnosis not present

## 2018-08-19 LAB — COMPREHENSIVE METABOLIC PANEL
ALT: 23 U/L (ref 0–44)
ANION GAP: 6 (ref 5–15)
AST: 24 U/L (ref 15–41)
Albumin: 3.8 g/dL (ref 3.5–5.0)
Alkaline Phosphatase: 97 U/L (ref 38–126)
BUN: 14 mg/dL (ref 6–20)
CO2: 24 mmol/L (ref 22–32)
Calcium: 9 mg/dL (ref 8.9–10.3)
Chloride: 103 mmol/L (ref 98–111)
Creatinine, Ser: 0.59 mg/dL (ref 0.44–1.00)
GFR calc Af Amer: >60 mL/min (ref >60)
GFR calc non Af Amer: >60 mL/min (ref >60)
Glucose, Bld: 81 mg/dL (ref 70–99)
POTASSIUM: 3.9 mmol/L (ref 3.5–5.1)
Sodium: 133 mmol/L — ABNORMAL LOW (ref 135–145)
Total Bilirubin: 0.8 mg/dL (ref 0.3–1.2)
Total Protein: 6.9 g/dL (ref 6.5–8.1)

## 2018-08-19 LAB — URINALYSIS, ROUTINE W REFLEX MICROSCOPIC
Bilirubin Urine: NEGATIVE
Glucose, UA: NEGATIVE mg/dL
Hgb urine dipstick: NEGATIVE
Ketones, ur: NEGATIVE mg/dL
LEUKOCYTE UA: NEGATIVE
Nitrite: NEGATIVE
Protein, ur: NEGATIVE mg/dL
Specific Gravity, Urine: 1.025 (ref 1.005–1.030)
pH: 6 (ref 5.0–8.0)

## 2018-08-19 LAB — CBC
HCT: 39.7 % (ref 36.0–46.0)
HEMOGLOBIN: 12.6 g/dL (ref 12.0–15.0)
MCH: 27.8 pg (ref 26.0–34.0)
MCHC: 31.7 g/dL (ref 30.0–36.0)
MCV: 87.4 fL (ref 80.0–100.0)
Platelets: 264 10*3/uL (ref 150–400)
RBC: 4.54 MIL/uL (ref 3.87–5.11)
RDW: 12.9 % (ref 11.5–15.5)
WBC: 11.9 10*3/uL — ABNORMAL HIGH (ref 4.0–10.5)
nRBC: 0 % (ref 0.0–0.2)

## 2018-08-19 LAB — PREGNANCY, URINE: PREG TEST UR: NEGATIVE

## 2018-08-19 LAB — LIPASE, BLOOD: LIPASE: 39 U/L (ref 11–51)

## 2018-08-19 MED ORDER — MORPHINE SULFATE (PF) 4 MG/ML IV SOLN
4.0000 mg | Freq: Once | INTRAVENOUS | Status: AC
  Administered 2018-08-19: 4 mg via INTRAVENOUS
  Filled 2018-08-19: qty 1

## 2018-08-19 MED ORDER — DICYCLOMINE HCL 20 MG PO TABS: 20.0000 mg | ORAL_TABLET | Freq: Two times a day (BID) | ORAL | 0 refills | Status: DC | PRN

## 2018-08-19 MED ORDER — HYDROMORPHONE HCL 1 MG/ML IJ SOLN
1.0000 mg | Freq: Once | INTRAMUSCULAR | Status: AC
  Administered 2018-08-19: 1 mg via INTRAVENOUS
  Filled 2018-08-19: qty 1

## 2018-08-19 MED ORDER — ONDANSETRON HCL 4 MG/2ML IJ SOLN
4.0000 mg | Freq: Once | INTRAMUSCULAR | Status: AC
  Administered 2018-08-19: 4 mg via INTRAVENOUS
  Filled 2018-08-19: qty 2

## 2018-08-19 MED ORDER — IOHEXOL 300 MG/ML  SOLN
100.0000 mL | Freq: Once | INTRAMUSCULAR | Status: AC | PRN
  Administered 2018-08-19: 100 mL via INTRAVENOUS

## 2018-08-19 MED ORDER — SUCRALFATE 1 G PO TABS: 1.0000 g | ORAL_TABLET | Freq: Four times a day (QID) | ORAL | 0 refills | Status: DC | PRN

## 2018-08-19 MED ORDER — ONDANSETRON 4 MG PO TBDP: 4.0000 mg | ORAL_TABLET | Freq: Three times a day (TID) | ORAL | 0 refills | Status: DC | PRN

## 2018-08-19 NOTE — ED Notes (Signed)
Patient transported to CT 

## 2018-08-19 NOTE — ED Notes (Signed)
ED Provider at bedside attempting to place US guided PIV at present time.

## 2018-08-19 NOTE — ED Notes (Signed)
Unsuccessful iv attempt, left ac

## 2018-08-19 NOTE — Discharge Instructions (Signed)
It was my pleasure taking care of you today!   Zofran as needed for nausea.   Bentyl and carafate as needed for pain.   Call your urologist and GI doctor in the morning to schedule a follow up appointment.   Return to ER for new or worsening symptoms, any additional concerns.
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
## 2018-08-19 NOTE — ED Notes (Signed)
CT awaiting results of BMET for scan with iv contrast per [facility] Radiology Protocol

## 2018-08-19 NOTE — ED Notes (Signed)
ED Provider at bedside. 

## 2018-08-19 NOTE — ED Notes (Signed)
Pt tearful at bedside, stating pain 10/10 when returned from CT

## 2018-08-19 NOTE — ED Triage Notes (Addendum)
Pt reports generalized abd pain and nausea since last night. Pt reports she used a laxative last night with some results. Pt uses laxatives on a regular basis

## 2018-08-22 ENCOUNTER — Telehealth: Payer: Self-pay | Admitting: Internal Medicine

## 2018-08-22 NOTE — Telephone Encounter (Signed)
Pt was last seen 2015. She was at the ER on 3-22 and was advised to see her GI doctor (Dr. Carlean Purl) patient said she has not seen another doctor since last visit with [place]. Would like to know if she can be seen said she is in a lot of pain.

## 2018-08-22 NOTE — Telephone Encounter (Signed)
Patient was seen in the ED for abdominal pain and asked to follow up with GI.  She is scheduled a Webex visit on Friday with Dr. Carlean Purl

## 2018-08-24 ENCOUNTER — Ambulatory Visit: Payer: 59 | Admitting: Internal Medicine

## 2018-08-24 ENCOUNTER — Other Ambulatory Visit: Payer: Self-pay

## 2018-10-19 ENCOUNTER — Emergency Department (HOSPITAL_BASED_OUTPATIENT_CLINIC_OR_DEPARTMENT_OTHER): Payer: 59

## 2018-10-19 ENCOUNTER — Other Ambulatory Visit: Payer: Self-pay

## 2018-10-19 ENCOUNTER — Encounter (HOSPITAL_BASED_OUTPATIENT_CLINIC_OR_DEPARTMENT_OTHER): Payer: Self-pay

## 2018-10-19 ENCOUNTER — Inpatient Hospital Stay (HOSPITAL_BASED_OUTPATIENT_CLINIC_OR_DEPARTMENT_OTHER)
Admission: EM | Admit: 2018-10-19 | Discharge: 2018-10-22 | DRG: 862 | Disposition: A | Discharge status: Home / Self Care | Payer: 59 | Attending: Internal Medicine | Admitting: Internal Medicine

## 2018-10-19 DIAGNOSIS — F319 Bipolar disorder, unspecified: Secondary | ICD-10-CM | POA: Diagnosis present

## 2018-10-19 DIAGNOSIS — Z880 Allergy status to penicillin: Secondary | ICD-10-CM

## 2018-10-19 DIAGNOSIS — Z79899 Other long term (current) drug therapy: Secondary | ICD-10-CM

## 2018-10-19 DIAGNOSIS — Z8711 Personal history of peptic ulcer disease: Secondary | ICD-10-CM

## 2018-10-19 DIAGNOSIS — M1990 Unspecified osteoarthritis, unspecified site: Secondary | ICD-10-CM | POA: Diagnosis present

## 2018-10-19 DIAGNOSIS — T8144XA Sepsis following a procedure, initial encounter: Secondary | ICD-10-CM | POA: Diagnosis not present

## 2018-10-19 DIAGNOSIS — Z9079 Acquired absence of other genital organ(s): Secondary | ICD-10-CM

## 2018-10-19 DIAGNOSIS — Z20828 Contact with and (suspected) exposure to other viral communicable diseases: Secondary | ICD-10-CM | POA: Diagnosis present

## 2018-10-19 DIAGNOSIS — F419 Anxiety disorder, unspecified: Secondary | ICD-10-CM | POA: Diagnosis present

## 2018-10-19 DIAGNOSIS — E785 Hyperlipidemia, unspecified: Secondary | ICD-10-CM | POA: Diagnosis present

## 2018-10-19 DIAGNOSIS — I10 Essential (primary) hypertension: Secondary | ICD-10-CM | POA: Diagnosis present

## 2018-10-19 DIAGNOSIS — Z800 Family history of malignant neoplasm of digestive organs: Secondary | ICD-10-CM

## 2018-10-19 DIAGNOSIS — Z8249 Family history of ischemic heart disease and other diseases of the circulatory system: Secondary | ICD-10-CM

## 2018-10-19 DIAGNOSIS — Z888 Allergy status to other drugs, medicaments and biological substances status: Secondary | ICD-10-CM

## 2018-10-19 DIAGNOSIS — Z87891 Personal history of nicotine dependence: Secondary | ICD-10-CM

## 2018-10-19 DIAGNOSIS — N12 Tubulo-interstitial nephritis, not specified as acute or chronic: Secondary | ICD-10-CM | POA: Diagnosis not present

## 2018-10-19 DIAGNOSIS — M797 Fibromyalgia: Secondary | ICD-10-CM | POA: Diagnosis present

## 2018-10-19 DIAGNOSIS — Z91048 Other nonmedicinal substance allergy status: Secondary | ICD-10-CM

## 2018-10-19 DIAGNOSIS — N202 Calculus of kidney with calculus of ureter: Secondary | ICD-10-CM | POA: Diagnosis present

## 2018-10-19 DIAGNOSIS — A4150 Gram-negative sepsis, unspecified: Secondary | ICD-10-CM | POA: Diagnosis present

## 2018-10-19 DIAGNOSIS — Z87442 Personal history of urinary calculi: Secondary | ICD-10-CM

## 2018-10-19 DIAGNOSIS — Z90710 Acquired absence of both cervix and uterus: Secondary | ICD-10-CM

## 2018-10-19 DIAGNOSIS — Z87440 Personal history of urinary (tract) infections: Secondary | ICD-10-CM

## 2018-10-19 DIAGNOSIS — G43909 Migraine, unspecified, not intractable, without status migrainosus: Secondary | ICD-10-CM | POA: Diagnosis present

## 2018-10-19 DIAGNOSIS — N10 Acute tubulo-interstitial nephritis: Secondary | ICD-10-CM | POA: Diagnosis present

## 2018-10-19 DIAGNOSIS — G40909 Epilepsy, unspecified, not intractable, without status epilepticus: Secondary | ICD-10-CM | POA: Diagnosis present

## 2018-10-19 DIAGNOSIS — G894 Chronic pain syndrome: Secondary | ICD-10-CM | POA: Diagnosis present

## 2018-10-19 DIAGNOSIS — B961 Klebsiella pneumoniae [K. pneumoniae] as the cause of diseases classified elsewhere: Secondary | ICD-10-CM | POA: Diagnosis present

## 2018-10-19 DIAGNOSIS — K219 Gastro-esophageal reflux disease without esophagitis: Secondary | ICD-10-CM | POA: Diagnosis present

## 2018-10-19 LAB — CBC WITH DIFFERENTIAL/PLATELET
Abs Immature Granulocytes: 0.07 10*3/uL (ref 0.00–0.07)
Basophils Absolute: 0 10*3/uL (ref 0.0–0.1)
Basophils Relative: 0 %
Eosinophils Absolute: 0 10*3/uL (ref 0.0–0.5)
Eosinophils Relative: 0 %
HCT: 40.3 % (ref 36.0–46.0)
Hemoglobin: 12.8 g/dL (ref 12.0–15.0)
Immature Granulocytes: 0 %
Lymphocytes Relative: 8 %
Lymphs Abs: 1.3 10*3/uL (ref 0.7–4.0)
MCH: 27.5 pg (ref 26.0–34.0)
MCHC: 31.8 g/dL (ref 30.0–36.0)
MCV: 86.5 fL (ref 80.0–100.0)
Monocytes Absolute: 1.2 10*3/uL — ABNORMAL HIGH (ref 0.1–1.0)
Monocytes Relative: 7 %
Neutro Abs: 13.3 10*3/uL — ABNORMAL HIGH (ref 1.7–7.7)
Neutrophils Relative %: 85 %
Platelets: 244 10*3/uL (ref 150–400)
RBC: 4.66 MIL/uL (ref 3.87–5.11)
RDW: 13 % (ref 11.5–15.5)
WBC: 16 10*3/uL — ABNORMAL HIGH (ref 4.0–10.5)
nRBC: 0 % (ref 0.0–0.2)

## 2018-10-19 LAB — COMPREHENSIVE METABOLIC PANEL
ALT: 25 U/L (ref 0–44)
AST: 18 U/L (ref 15–41)
Albumin: 3.9 g/dL (ref 3.5–5.0)
Alkaline Phosphatase: 98 U/L (ref 38–126)
Anion gap: 10 (ref 5–15)
BUN: 12 mg/dL (ref 6–20)
CO2: 23 mmol/L (ref 22–32)
Calcium: 9.3 mg/dL (ref 8.9–10.3)
Chloride: 99 mmol/L (ref 98–111)
Creatinine, Ser: 0.68 mg/dL (ref 0.44–1.00)
GFR calc Af Amer: >60 mL/min (ref >60)
GFR calc non Af Amer: >60 mL/min (ref >60)
Glucose, Bld: 109 mg/dL — ABNORMAL HIGH (ref 70–99)
Potassium: 3.9 mmol/L (ref 3.5–5.1)
Sodium: 132 mmol/L — ABNORMAL LOW (ref 135–145)
Total Bilirubin: 0.8 mg/dL (ref 0.3–1.2)
Total Protein: 7.2 g/dL (ref 6.5–8.1)

## 2018-10-19 LAB — URINALYSIS, MICROSCOPIC (REFLEX): WBC, UA: >50 WBC/hpf (ref 0–5)

## 2018-10-19 LAB — URINALYSIS, ROUTINE W REFLEX MICROSCOPIC
Bilirubin Urine: NEGATIVE
Glucose, UA: NEGATIVE mg/dL
Ketones, ur: NEGATIVE mg/dL
Nitrite: NEGATIVE
Protein, ur: 30 mg/dL — AB
Specific Gravity, Urine: 1.02 (ref 1.005–1.030)
pH: 7 (ref 5.0–8.0)

## 2018-10-19 LAB — LIPASE, BLOOD: Lipase: 27 U/L (ref 11–51)

## 2018-10-19 LAB — LACTIC ACID, PLASMA: Lactic Acid, Venous: 1 mmol/L (ref 0.5–1.9)

## 2018-10-19 MED ORDER — SODIUM CHLORIDE 0.9 % IV SOLN
1.0000 g | Freq: Once | INTRAVENOUS | Status: AC
  Administered 2018-10-19: 1 g via INTRAVENOUS
  Filled 2018-10-19: qty 10

## 2018-10-19 MED ORDER — ACETAMINOPHEN 325 MG PO TABS
650.0000 mg | ORAL_TABLET | Freq: Once | ORAL | Status: AC
  Administered 2018-10-19: 650 mg via ORAL
  Filled 2018-10-19: qty 2

## 2018-10-19 MED ORDER — HYDROMORPHONE HCL 1 MG/ML IJ SOLN
1.0000 mg | Freq: Once | INTRAMUSCULAR | Status: AC
  Administered 2018-10-19: 1 mg via INTRAVENOUS
  Filled 2018-10-19: qty 1

## 2018-10-19 MED ORDER — SODIUM CHLORIDE 0.9 % IV BOLUS
1000.0000 mL | Freq: Once | INTRAVENOUS | Status: AC
  Administered 2018-10-19: 1000 mL via INTRAVENOUS

## 2018-10-19 MED ORDER — ONDANSETRON HCL 4 MG/2ML IJ SOLN
4.0000 mg | Freq: Once | INTRAMUSCULAR | Status: AC
  Administered 2018-10-19: 4 mg via INTRAVENOUS
  Filled 2018-10-19: qty 2

## 2018-10-19 MED ORDER — MORPHINE SULFATE (PF) 4 MG/ML IV SOLN
4.0000 mg | Freq: Once | INTRAVENOUS | Status: AC
  Administered 2018-10-19: 4 mg via INTRAVENOUS
  Filled 2018-10-19: qty 1

## 2018-10-19 NOTE — ED Notes (Signed)
Patient transported to CT 
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
## 2018-10-19 NOTE — ED Triage Notes (Signed)
Pt c/o day 2 of fever-had lithotripsy on 5/20-NAD-to triage in w/c

## 2018-10-19 NOTE — ED Notes (Signed)
[facility] [place]) [phone]

## 2018-10-19 NOTE — ED Notes (Signed)
PT states has a high pain tolerance d/t taking pain medication everyday.

## 2018-10-19 NOTE — ED Notes (Signed)
amb to [facility] w/o difficulty

## 2018-10-20 DIAGNOSIS — A4150 Gram-negative sepsis, unspecified: Secondary | ICD-10-CM | POA: Diagnosis present

## 2018-10-20 DIAGNOSIS — F419 Anxiety disorder, unspecified: Secondary | ICD-10-CM | POA: Diagnosis present

## 2018-10-20 DIAGNOSIS — N12 Tubulo-interstitial nephritis, not specified as acute or chronic: Secondary | ICD-10-CM | POA: Diagnosis present

## 2018-10-20 DIAGNOSIS — Z79899 Other long term (current) drug therapy: Secondary | ICD-10-CM | POA: Diagnosis not present

## 2018-10-20 DIAGNOSIS — Z888 Allergy status to other drugs, medicaments and biological substances status: Secondary | ICD-10-CM | POA: Diagnosis not present

## 2018-10-20 DIAGNOSIS — Z87440 Personal history of urinary (tract) infections: Secondary | ICD-10-CM | POA: Diagnosis not present

## 2018-10-20 DIAGNOSIS — N10 Acute tubulo-interstitial nephritis: Secondary | ICD-10-CM | POA: Diagnosis present

## 2018-10-20 DIAGNOSIS — E785 Hyperlipidemia, unspecified: Secondary | ICD-10-CM | POA: Diagnosis present

## 2018-10-20 DIAGNOSIS — N202 Calculus of kidney with calculus of ureter: Secondary | ICD-10-CM | POA: Diagnosis present

## 2018-10-20 DIAGNOSIS — I10 Essential (primary) hypertension: Secondary | ICD-10-CM | POA: Diagnosis present

## 2018-10-20 DIAGNOSIS — K219 Gastro-esophageal reflux disease without esophagitis: Secondary | ICD-10-CM | POA: Diagnosis present

## 2018-10-20 DIAGNOSIS — Z20828 Contact with and (suspected) exposure to other viral communicable diseases: Secondary | ICD-10-CM | POA: Diagnosis present

## 2018-10-20 DIAGNOSIS — G894 Chronic pain syndrome: Secondary | ICD-10-CM | POA: Diagnosis present

## 2018-10-20 DIAGNOSIS — Z87442 Personal history of urinary calculi: Secondary | ICD-10-CM | POA: Diagnosis not present

## 2018-10-20 DIAGNOSIS — G40909 Epilepsy, unspecified, not intractable, without status epilepticus: Secondary | ICD-10-CM | POA: Diagnosis present

## 2018-10-20 DIAGNOSIS — Z8711 Personal history of peptic ulcer disease: Secondary | ICD-10-CM | POA: Diagnosis not present

## 2018-10-20 DIAGNOSIS — M797 Fibromyalgia: Secondary | ICD-10-CM | POA: Diagnosis present

## 2018-10-20 DIAGNOSIS — B961 Klebsiella pneumoniae [K. pneumoniae] as the cause of diseases classified elsewhere: Secondary | ICD-10-CM | POA: Diagnosis present

## 2018-10-20 DIAGNOSIS — G43909 Migraine, unspecified, not intractable, without status migrainosus: Secondary | ICD-10-CM | POA: Diagnosis present

## 2018-10-20 DIAGNOSIS — T8144XA Sepsis following a procedure, initial encounter: Secondary | ICD-10-CM | POA: Diagnosis present

## 2018-10-20 DIAGNOSIS — Z880 Allergy status to penicillin: Secondary | ICD-10-CM | POA: Diagnosis not present

## 2018-10-20 DIAGNOSIS — Z87891 Personal history of nicotine dependence: Secondary | ICD-10-CM | POA: Diagnosis not present

## 2018-10-20 DIAGNOSIS — Z91048 Other nonmedicinal substance allergy status: Secondary | ICD-10-CM | POA: Diagnosis not present

## 2018-10-20 DIAGNOSIS — F319 Bipolar disorder, unspecified: Secondary | ICD-10-CM | POA: Diagnosis present

## 2018-10-20 DIAGNOSIS — M1990 Unspecified osteoarthritis, unspecified site: Secondary | ICD-10-CM | POA: Diagnosis present

## 2018-10-20 LAB — CBC
HCT: 36 % (ref 36.0–46.0)
Hemoglobin: 11.4 g/dL — ABNORMAL LOW (ref 12.0–15.0)
MCH: 28.3 pg (ref 26.0–34.0)
MCHC: 31.7 g/dL (ref 30.0–36.0)
MCV: 89.3 fL (ref 80.0–100.0)
Platelets: 198 10*3/uL (ref 150–400)
RBC: 4.03 MIL/uL (ref 3.87–5.11)
RDW: 13.1 % (ref 11.5–15.5)
WBC: 12.9 10*3/uL — ABNORMAL HIGH (ref 4.0–10.5)
nRBC: 0 % (ref 0.0–0.2)

## 2018-10-20 LAB — BASIC METABOLIC PANEL
Anion gap: 8 (ref 5–15)
BUN: 10 mg/dL (ref 6–20)
CO2: 24 mmol/L (ref 22–32)
Calcium: 8.5 mg/dL — ABNORMAL LOW (ref 8.9–10.3)
Chloride: 103 mmol/L (ref 98–111)
Creatinine, Ser: 0.76 mg/dL (ref 0.44–1.00)
GFR calc Af Amer: >60 mL/min (ref >60)
GFR calc non Af Amer: >60 mL/min (ref >60)
Glucose, Bld: 111 mg/dL — ABNORMAL HIGH (ref 70–99)
Potassium: 3.9 mmol/L (ref 3.5–5.1)
Sodium: 135 mmol/L (ref 135–145)

## 2018-10-20 LAB — SARS CORONAVIRUS 2 AG (30 MIN TAT): SARS Coronavirus 2 Ag: NEGATIVE

## 2018-10-20 LAB — HIV ANTIBODY (ROUTINE TESTING W REFLEX): HIV Screen 4th Generation wRfx: NONREACTIVE

## 2018-10-20 MED ORDER — ONDANSETRON HCL 4 MG/2ML IJ SOLN
4.0000 mg | Freq: Four times a day (QID) | INTRAMUSCULAR | Status: DC | PRN
  Administered 2018-10-20 (×3): 4 mg via INTRAVENOUS
  Filled 2018-10-20 (×3): qty 2

## 2018-10-20 MED ORDER — OXYBUTYNIN CHLORIDE 5 MG PO TABS
5.0000 mg | ORAL_TABLET | Freq: Three times a day (TID) | ORAL | Status: DC
  Administered 2018-10-20 – 2018-10-22 (×6): 5 mg via ORAL
  Filled 2018-10-20 (×6): qty 1

## 2018-10-20 MED ORDER — BUTALBITAL-APAP-CAFFEINE 50-325-40 MG PO TABS
1.0000 | ORAL_TABLET | ORAL | Status: DC | PRN
  Administered 2018-10-20 – 2018-10-21 (×3): 1 via ORAL
  Filled 2018-10-20 (×3): qty 1

## 2018-10-20 MED ORDER — TAMSULOSIN HCL 0.4 MG PO CAPS
0.4000 mg | ORAL_CAPSULE | Freq: Every day | ORAL | Status: DC
  Administered 2018-10-20 – 2018-10-22 (×3): 0.4 mg via ORAL
  Filled 2018-10-20 (×3): qty 1

## 2018-10-20 MED ORDER — PREGABALIN 50 MG PO CAPS
100.0000 mg | ORAL_CAPSULE | Freq: Two times a day (BID) | ORAL | Status: DC
  Administered 2018-10-20 – 2018-10-22 (×4): 100 mg via ORAL
  Filled 2018-10-20 (×4): qty 2

## 2018-10-20 MED ORDER — ACETAMINOPHEN 325 MG PO TABS
650.0000 mg | ORAL_TABLET | Freq: Once | ORAL | Status: AC
  Administered 2018-10-20: 650 mg via ORAL
  Filled 2018-10-20: qty 2

## 2018-10-20 MED ORDER — LISINOPRIL 10 MG PO TABS
10.0000 mg | ORAL_TABLET | Freq: Every day | ORAL | Status: DC
  Administered 2018-10-20 – 2018-10-22 (×3): 10 mg via ORAL
  Filled 2018-10-20 (×4): qty 1

## 2018-10-20 MED ORDER — SODIUM CHLORIDE 0.9 % IV SOLN
INTRAVENOUS | Status: AC
  Administered 2018-10-20 (×2): via INTRAVENOUS

## 2018-10-20 MED ORDER — ACETAMINOPHEN 500 MG PO TABS: 1000.0000 mg | ORAL_TABLET | Freq: Four times a day (QID) | ORAL | Status: DC | PRN

## 2018-10-20 MED ORDER — ONDANSETRON 4 MG PO TBDP: 4.0000 mg | ORAL_TABLET | Freq: Three times a day (TID) | ORAL | Status: DC | PRN

## 2018-10-20 MED ORDER — ACETAMINOPHEN 650 MG RE SUPP: 650.0000 mg | Freq: Four times a day (QID) | RECTAL | Status: DC | PRN

## 2018-10-20 MED ORDER — POLYETHYLENE GLYCOL 3350 17 G PO PACK: 17.0000 g | PACK | Freq: Every day | ORAL | Status: DC | PRN

## 2018-10-20 MED ORDER — KETOROLAC TROMETHAMINE 30 MG/ML IJ SOLN
30.0000 mg | Freq: Four times a day (QID) | INTRAMUSCULAR | Status: DC | PRN
  Administered 2018-10-20 – 2018-10-22 (×5): 30 mg via INTRAVENOUS
  Filled 2018-10-20 (×7): qty 1

## 2018-10-20 MED ORDER — SODIUM CHLORIDE 0.9 % IV BOLUS
1000.0000 mL | Freq: Once | INTRAVENOUS | Status: AC
  Administered 2018-10-20: 1000 mL via INTRAVENOUS

## 2018-10-20 MED ORDER — HYDROMORPHONE HCL 1 MG/ML IJ SOLN
1.0000 mg | Freq: Once | INTRAMUSCULAR | Status: AC
  Administered 2018-10-20: 1 mg via INTRAVENOUS
  Filled 2018-10-20: qty 1

## 2018-10-20 MED ORDER — VENLAFAXINE HCL ER 150 MG PO CP24
150.0000 mg | ORAL_CAPSULE | Freq: Every day | ORAL | Status: DC
  Administered 2018-10-20 – 2018-10-22 (×3): 150 mg via ORAL
  Filled 2018-10-20 (×3): qty 1

## 2018-10-20 MED ORDER — KETOROLAC TROMETHAMINE 30 MG/ML IJ SOLN
30.0000 mg | Freq: Four times a day (QID) | INTRAMUSCULAR | Status: DC
  Administered 2018-10-20: 30 mg via INTRAVENOUS
  Filled 2018-10-20: qty 1

## 2018-10-20 MED ORDER — TRAZODONE HCL 100 MG PO TABS
200.0000 mg | ORAL_TABLET | Freq: Every day | ORAL | Status: DC
  Administered 2018-10-20 – 2018-10-21 (×2): 200 mg via ORAL
  Filled 2018-10-20 (×2): qty 2

## 2018-10-20 MED ORDER — ONDANSETRON HCL 4 MG PO TABS: 4.0000 mg | ORAL_TABLET | Freq: Four times a day (QID) | ORAL | Status: DC | PRN

## 2018-10-20 MED ORDER — HYDROMORPHONE HCL 1 MG/ML IJ SOLN
0.5000 mg | INTRAMUSCULAR | Status: DC | PRN
  Administered 2018-10-20 – 2018-10-21 (×6): 0.5 mg via INTRAVENOUS
  Filled 2018-10-20 (×6): qty 0.5

## 2018-10-20 MED ORDER — SUMATRIPTAN SUCCINATE 50 MG PO TABS
50.0000 mg | ORAL_TABLET | ORAL | Status: AC | PRN
  Administered 2018-10-20 – 2018-10-21 (×2): 50 mg via ORAL
  Filled 2018-10-20 (×4): qty 1

## 2018-10-20 MED ORDER — ATORVASTATIN CALCIUM 40 MG PO TABS
40.0000 mg | ORAL_TABLET | Freq: Every day | ORAL | Status: DC
  Administered 2018-10-20 – 2018-10-22 (×3): 40 mg via ORAL
  Filled 2018-10-20 (×3): qty 1

## 2018-10-20 MED ORDER — SODIUM CHLORIDE 0.9 % IV SOLN
1.0000 g | Freq: Once | INTRAVENOUS | Status: AC
  Administered 2018-10-20: 1 g via INTRAVENOUS
  Filled 2018-10-20: qty 1

## 2018-10-20 MED ORDER — LORAZEPAM 0.5 MG PO TABS
0.5000 mg | ORAL_TABLET | Freq: Every day | ORAL | Status: DC | PRN
  Administered 2018-10-20: 0.5 mg via ORAL
  Filled 2018-10-20: qty 1

## 2018-10-20 MED ORDER — SODIUM CHLORIDE 0.9 % IV SOLN
2.0000 g | INTRAVENOUS | Status: DC
  Administered 2018-10-20 – 2018-10-21 (×2): 2 g via INTRAVENOUS
  Filled 2018-10-20 (×2): qty 2

## 2018-10-20 MED ORDER — ACETAMINOPHEN 325 MG PO TABS
650.0000 mg | ORAL_TABLET | Freq: Four times a day (QID) | ORAL | Status: DC | PRN
  Administered 2018-10-20 – 2018-10-21 (×4): 650 mg via ORAL
  Filled 2018-10-20 (×4): qty 2
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
## 2018-10-20 NOTE — Progress Notes (Signed)
Transfer from [facility] to [facility] for pyelonephritis with sepsis-fever 103, with leukocytosis 16.. Patient had recent lithotripsy 5/20. EDP talked to urologist at [facility], patient accepted but no beds at [facility] hence admission here.  During patient is septic, I recommended ED provider talk urology on call for [facility] long as CT abdomen is showing a faint punctate minimally obstructing stone in the proximal left ureter.  EDP to Dr. Diona Fanti, who recommends transfer to [facility] long, n.p.o., take to OR in a.m. for stent.  Temporary admit orders placed- inpatient telemetry.  Garnet Sierras, MD [facility] 2:33 AM 10/20/18.   LOS- NO CHARGE.

## 2018-10-20 NOTE — ED Provider Notes (Signed)
Discussed pt with urology, Dr Diona Fanti, including pt with uti/pyelo, soft bp, ct with ?prox left ureteral stone, left pelviectasis, bil kidney stones, and plan for admit to hospitalist at [facility].   He indicates keep npo, ivf, iv abx, transfer to [facility]/hospitalists, he will plan to take to OR in AM for stent. Keep npo.   Await transfer to [facility].      Lajean Saver, MD 10/20/18 [phone]
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
## 2018-10-20 NOTE — Progress Notes (Signed)
Patient is a 48 year old female with history of nephrolithiasis, seizure disorder, depression, bipolar disorder, fibromyalgia, chronic pain syndrome, Gullien Barre syndrome who presented to the emergency room with complaints of fever, chills, dysuria, worsening bilateral flank pain.  She recently had lithotripsy procedure 2 days ago for a 12 mm kidney stone at [facility]. On presentation she was febrile, tachycardic, had leukocytosis.  UA showed moderate leukocytes.  CT renal study showed bilateral nephrolithiasis left worse than right.  Patient was suspected to have urinary tract infection and admitted for IV antibiotics. Urine culture, blood culture sent .Urology following.  Continue current antibiotics.  Patient seen and examined the bedside this morning.  Currently hemodynamically stable.  Afebrile.  Still complains of left-sided flank pain.  No nausea or vomiting. Patient seen by Dr. Denton Brick this morning.

## 2018-10-21 LAB — BLOOD CULTURE ID PANEL (REFLEXED)

## 2018-10-21 MED ORDER — KETOROLAC TROMETHAMINE 15 MG/ML IJ SOLN
15.0000 mg | Freq: Once | INTRAMUSCULAR | Status: AC
  Administered 2018-10-21: 15 mg via INTRAVENOUS
  Filled 2018-10-21: qty 1

## 2018-10-21 MED ORDER — SUMATRIPTAN SUCCINATE 50 MG PO TABS
50.0000 mg | ORAL_TABLET | ORAL | Status: AC | PRN
  Administered 2018-10-21 – 2018-10-22 (×2): 50 mg via ORAL
  Filled 2018-10-21 (×3): qty 1

## 2018-10-21 MED ORDER — METHYLPREDNISOLONE SODIUM SUCC 125 MG IJ SOLR
60.0000 mg | Freq: Once | INTRAMUSCULAR | Status: AC
  Administered 2018-10-21: 60 mg via INTRAVENOUS
  Filled 2018-10-21: qty 2

## 2018-10-21 MED ORDER — PROCHLORPERAZINE EDISYLATE 10 MG/2ML IJ SOLN
10.0000 mg | Freq: Once | INTRAMUSCULAR | Status: AC
  Administered 2018-10-21: 10 mg via INTRAVENOUS
  Filled 2018-10-21: qty 2

## 2018-10-21 MED ORDER — DIPHENHYDRAMINE HCL 50 MG/ML IJ SOLN
50.0000 mg | Freq: Once | INTRAMUSCULAR | Status: AC
  Administered 2018-10-21: 50 mg via INTRAVENOUS
  Filled 2018-10-21: qty 1

## 2018-10-21 MED ORDER — SUMATRIPTAN SUCCINATE 6 MG/0.5ML ~~LOC~~ SOLN
6.0000 mg | Freq: Once | SUBCUTANEOUS | Status: AC
  Administered 2018-10-21: 6 mg via SUBCUTANEOUS
  Filled 2018-10-21: qty 0.5

## 2018-10-21 NOTE — Progress Notes (Signed)
PHARMACY - PHYSICIAN COMMUNICATION CRITICAL VALUE ALERT - BLOOD CULTURE IDENTIFICATION (BCID)  [name] is an 48 y.o. female who presented to [facility] on 10/19/2018 with a chief complaint of fever.   Assessment:  Patient with Klebsiella pneumoniae noted on BCID. (include suspected source if known)  Name of physician (or Provider) Contacted: none  Current antibiotics: Ceftriaxone 2gm iv q24hr  Changes to prescribed antibiotics recommended:  Patient is on recommended antibiotics - No changes needed  Results for orders placed or performed during the hospital encounter of 10/19/18  Blood Culture ID Panel (Reflexed) (Collected: 10/19/2018 10:40 PM)  Result Value Ref Range   Enterococcus species NOT DETECTED NOT DETECTED   Listeria monocytogenes NOT DETECTED NOT DETECTED   Staphylococcus species NOT DETECTED NOT DETECTED   Staphylococcus aureus (BCID) NOT DETECTED NOT DETECTED   Streptococcus species NOT DETECTED NOT DETECTED   Streptococcus agalactiae NOT DETECTED NOT DETECTED   Streptococcus pneumoniae NOT DETECTED NOT DETECTED   Streptococcus pyogenes NOT DETECTED NOT DETECTED   Acinetobacter baumannii NOT DETECTED NOT DETECTED   Enterobacteriaceae species DETECTED (A) NOT DETECTED   Enterobacter cloacae complex NOT DETECTED NOT DETECTED   Escherichia coli NOT DETECTED NOT DETECTED   Klebsiella oxytoca NOT DETECTED NOT DETECTED   Klebsiella pneumoniae DETECTED (A) NOT DETECTED   Proteus species NOT DETECTED NOT DETECTED   Serratia marcescens NOT DETECTED NOT DETECTED   Carbapenem resistance NOT DETECTED NOT DETECTED   Haemophilus influenzae NOT DETECTED NOT DETECTED   Neisseria meningitidis NOT DETECTED NOT DETECTED   Pseudomonas aeruginosa NOT DETECTED NOT DETECTED   Candida albicans NOT DETECTED NOT DETECTED   Candida glabrata NOT DETECTED NOT DETECTED   Candida krusei NOT DETECTED NOT DETECTED   Candida parapsilosis NOT DETECTED NOT DETECTED   Candida tropicalis  NOT DETECTED NOT DETECTED    [name] 10/21/2018  1:28 AM

## 2018-10-21 NOTE — Progress Notes (Signed)
PROGRESS NOTE    KARN DERK  QJJ:[id number] DOB: [birth year] DOA: 10/19/2018 PCP: System, Pcp Not In   Brief Narrative: Patient is a 48 year old female with history of nephrolithiasis, seizure disorder, depression, bipolar disorder, fibromyalgia, chronic pain syndrome, Gullien Barre syndrome who presented to the emergency room with complaints of fever, chills, dysuria, worsening bilateral flank pain.  She recently had lithotripsy procedure 2 days ago for a 12 mm kidney stone at [facility]. On presentation she was febrile, tachycardic, had leukocytosis.  UA showed moderate leukocytes.  CT renal study showed bilateral nephrolithiasis left worse than right.  Patient was suspected to have urinary tract infection and admitted for IV antibiotics. Urine culture, blood culture sent .Urology following.  Blood cultures showing Klebsiella  Pneumoniae. Waiting for culture and sensitivity.  Hospital course remarkable for persistent migraine headache.  Assessment & Plan:   Active Problems:   Pyelonephritis  Gram-negative bacteremia: Most likely associated with recent urological procedure.  Blood cultures ,urine culture showing   Klebsiella  Pneumoniae. Will wait for final culture and sensitivity.  Continue ceftriaxone.  Currently afebrile and hemodynamically stable.  Pyelonephritis with sepsis: Presented with fever, dysuria, bilateral flank pain.Continue current antibiotics.  Nephrolithiasis: Recent left-sided nephrolithotripsy for 12 mm stone.  CT showed very faint punctate minimally obstructing stone in the proximal left ureter.  Urology following.  Migraine headache: Complains of severe headache this morning.  We will try combination of Benadryl, ketorolac and Compazine.  Also given a dose of subcutaneous sumatriptan.  Continue oral sumatriptan as needed.  If it does not help will try steroids.  Seizure disorder: Not on antiseizure medications.  Depression/fibromyalgia: Restarted her  home medications  Hyperlipidemia: Continue statin         DVT prophylaxis:SCD Code Status: Full Family Communication: None present at the bedside Disposition Plan: Home after culture and sensitivity report, resolution of headache   Consultants:   Procedures:  Antimicrobials:  Anti-infectives (From admission, onward)   Start     Dose/Rate Route Frequency Ordered Stop   10/20/18 2200  cefTRIAXone (ROCEPHIN) 2 g in sodium chloride 0.9 % 100 mL IVPB     2 g 200 mL/hr over 30 Minutes Intravenous Every 24 hours 10/20/18 0451     10/20/18 0500  cefTRIAXone (ROCEPHIN) 1 g in sodium chloride 0.9 % 100 mL IVPB     1 g 200 mL/hr over 30 Minutes Intravenous  Once 10/20/18 0451 10/20/18 0615   10/19/18 2315  cefTRIAXone (ROCEPHIN) 1 g in sodium chloride 0.9 % 100 mL IVPB     1 g 200 mL/hr over 30 Minutes Intravenous  Once 10/19/18 2312 10/20/18 0005      Subjective: Patient seen and examined the bedside this morning.  Afebrile.  Hemodynamically stable.  Complains of severe headache this morning.  Objective: Vitals:   10/20/18 2052 10/21/18 0518 10/21/18 0758 10/21/18 0932  BP: (!) 145/82 137/82  139/70  Pulse: 88 87  96  Resp: 20 18  18   Temp: 98.2 F (36.8 C) 99.6 F (37.6 C) 99 F (37.2 C) 100.1 F (37.8 C)  TempSrc: Oral Oral Oral Oral  SpO2: 94% 91%    Weight:      Height:        Intake/Output Summary (Last 24 hours) at 10/21/2018 1057 Last data filed at 10/21/2018 0517 Gross per 24 hour  Intake 868.9 ml  Output 240 ml  Net 628.9 ml   Filed Weights   10/20/18 0452  Weight: 90.3 kg  Examination:  General exam: Moderate distress due to headache, obese  HEENT:PERRL,Oral mucosa moist, Ear/Nose normal on gross exam Respiratory system: Bilateral equal air entry, normal vesicular breath sounds, no wheezes or crackles  Cardiovascular system: S1 & S2 heard, RRR. No JVD, murmurs, rubs, gallops or clicks. No pedal edema. Gastrointestinal system: Abdomen is  nondistended, soft and nontender. No organomegaly or masses felt. Normal bowel sounds heard. Central nervous system: Alert and oriented. No focal neurological deficits. Extremities: No edema, no clubbing ,no cyanosis, distal peripheral pulses palpable. Skin: No rashes, lesions or ulcers,no icterus ,no pallor MSK: Normal muscle bulk,tone ,power Psychiatry: Judgement and insight appear normal. Mood & affect appropriate.     Data Reviewed: I have personally reviewed following labs and imaging studies  CBC: Recent Labs  Lab 10/19/18 2106 10/20/18 0516  WBC 16.0* 12.9*  NEUTROABS 13.3*  --   HGB 12.8 11.4*  HCT 40.3 36.0  MCV 86.5 89.3  PLT 244 160   Basic Metabolic Panel: Recent Labs  Lab 10/19/18 2106 10/20/18 0516  NA 132* 135  K 3.9 3.9  CL 99 103  CO2 23 24  GLUCOSE 109* 111*  BUN 12 10  CREATININE 0.68 0.76  CALCIUM 9.3 8.5*   GFR: Estimated Creatinine Clearance: 92.8 mL/min (by C-G formula based on SCr of 0.76 mg/dL). Liver Function Tests: Recent Labs  Lab 10/19/18 2106  AST 18  ALT 25  ALKPHOS 98  BILITOT 0.8  PROT 7.2  ALBUMIN 3.9   Recent Labs  Lab 10/19/18 2106  LIPASE 27   No results for input(s): AMMONIA in the last 168 hours. Coagulation Profile: No results for input(s): INR, PROTIME in the last 168 hours. Cardiac Enzymes: No results for input(s): CKTOTAL, CKMB, CKMBINDEX, TROPONINI in the last 168 hours. BNP (last 3 results) No results for input(s): PROBNP in the last 8760 hours. HbA1C: No results for input(s): HGBA1C in the last 72 hours. CBG: No results for input(s): GLUCAP in the last 168 hours. Lipid Profile: No results for input(s): CHOL, HDL, LDLCALC, TRIG, CHOLHDL, LDLDIRECT in the last 72 hours. Thyroid Function Tests: No results for input(s): TSH, T4TOTAL, FREET4, T3FREE, THYROIDAB in the last 72 hours. Anemia Panel: No results for input(s): VITAMINB12, FOLATE, FERRITIN, TIBC, IRON, RETICCTPCT in the last 72 hours. Sepsis  Labs: Recent Labs  Lab 10/19/18 2106  LATICACIDVEN 1.0    Recent Results (from the past 240 hour(s))  Urine culture     Status: Abnormal (Preliminary result)   Collection Time: 10/19/18  8:15 PM  Result Value Ref Range Status   Specimen Description   Final    URINE, RANDOM Performed at [facility], [address] [place], [place] [address]    Special Requests   Final    NONE Performed at [facility], [address] [place], [place] [address]    Culture >=100,000 COLONIES/mL GRAM NEGATIVE RODS (A)  Final   Report Status PENDING  Incomplete  Blood culture (routine x 2)     Status: None (Preliminary result)   Collection Time: 10/19/18  9:30 PM  Result Value Ref Range Status   Specimen Description   Final    BLOOD RIGHT ARM Performed at [facility], [address] [place], [place] [address]    Special Requests   Final    BOTTLES DRAWN AEROBIC AND ANAEROBIC Blood Culture adequate volume Performed at [facility], [address] [place], [place] [address]  Culture   Final    NO GROWTH < 24 HOURS Performed at [facility], [place] [address] [place], [place] [address]    Report Status PENDING  Incomplete  Blood culture (routine x 2)     Status: Abnormal (Preliminary result)   Collection Time: 10/19/18 10:40 PM  Result Value Ref Range Status   Specimen Description   Final    BLOOD LEFT HAND Performed at [facility], [address] [place], [place] [address]    Special Requests   Final    BOTTLES DRAWN AEROBIC AND ANAEROBIC Blood Culture adequate volume Performed at [facility], [address] [place], [place] [address]    Culture  Setup Time   Final    GRAM NEGATIVE RODS ANAEROBIC BOTTLE ONLY Organism ID to follow CRITICAL RESULT CALLED TO, READ BACK BY AND VERIFIED WITH: J.GRIMSLEY,PHARMD 0105 10/21/2018 M.CAMPBELL    Culture (A)  Final    KLEBSIELLA PNEUMONIAE SUSCEPTIBILITIES TO FOLLOW  Performed at [facility], [place] [address] [place], [place] [address]    Report Status PENDING  Incomplete  Blood Culture ID Panel (Reflexed)     Status: Abnormal   Collection Time: 10/19/18 10:40 PM  Result Value Ref Range Status   Enterococcus species NOT DETECTED NOT DETECTED Final   Listeria monocytogenes NOT DETECTED NOT DETECTED Final   Staphylococcus species NOT DETECTED NOT DETECTED Final   Staphylococcus aureus (BCID) NOT DETECTED NOT DETECTED Final   Streptococcus species NOT DETECTED NOT DETECTED Final   Streptococcus agalactiae NOT DETECTED NOT DETECTED Final   Streptococcus pneumoniae NOT DETECTED NOT DETECTED Final   Streptococcus pyogenes NOT DETECTED NOT DETECTED Final   Acinetobacter baumannii NOT DETECTED NOT DETECTED Final   Enterobacteriaceae species DETECTED (A) NOT DETECTED Final    Comment: Enterobacteriaceae represent a large family of gram-negative bacteria, not a single organism. CRITICAL RESULT CALLED TO, READ BACK BY AND VERIFIED WITH: J.GRIMSLEY,PHARMD 0105 10/21/2018 M.CAMPBELL    Enterobacter cloacae complex NOT DETECTED NOT DETECTED Final   Escherichia coli NOT DETECTED NOT DETECTED Final   Klebsiella oxytoca NOT DETECTED NOT DETECTED Final   Klebsiella pneumoniae DETECTED (A) NOT DETECTED Final    Comment: CRITICAL RESULT CALLED TO, READ BACK BY AND VERIFIED WITH: J.GRIMSLEY,PHARMD 0105 10/21/2018 M.CAMPBELL    Proteus species NOT DETECTED NOT DETECTED Final   Serratia marcescens NOT DETECTED NOT DETECTED Final   Carbapenem resistance NOT DETECTED NOT DETECTED Final   Haemophilus influenzae NOT DETECTED NOT DETECTED Final   Neisseria meningitidis NOT DETECTED NOT DETECTED Final   Pseudomonas aeruginosa NOT DETECTED NOT DETECTED Final   Candida albicans NOT DETECTED NOT DETECTED Final   Candida glabrata NOT DETECTED NOT DETECTED Final   Candida krusei NOT DETECTED NOT DETECTED Final   Candida parapsilosis NOT DETECTED NOT DETECTED Final    Candida tropicalis NOT DETECTED NOT DETECTED Final    Comment: Performed at [facility], [place] [address], [place], [place] [address]  SARS Coronavirus 2 (Hosp order,Performed in [facility] lab via [employer])     Status: None   Collection Time: 10/20/18 12:20 AM  Result Value Ref Range Status   SARS Coronavirus 2 ([employer] Now) NEGATIVE NEGATIVE Final    Comment: (NOTE) Interpretive Result Comment(s): COVID 19 Positive SARS CoV 2 target nucleic acids are DETECTED. The SARS CoV 2 RNA is generally detectable in upper and lower respiratory specimens during the acute phase of infection.  Positive results  are indicative of active infection with SARS CoV 2.  Clinical correlation with patient history and other diagnostic information is necessary to determine patient infection status.  Positive results do not rule out bacterial infection or coinfection with other viruses. The expected result is Negative. COVID 19 Negative SARS CoV 2 target nucleic acids are NOT DETECTED. The SARS CoV 2 RNA is generally detectable in upper and lower respiratory specimens during the acute phase of infection.  Negative results do not preclude SARS CoV 2 infection, do not rule out coinfections with other pathogens, and should not be used as the sole basis for treatment or other patient management decisions.  Negative results must be combined with clinical  observations, patient history, and epidemiological information. The expected result is Negative. Invalid Presence or absence of SARS CoV 2 nucleic acids cannot be determined. Repeat testing was performed on the submitted specimen and repeated Invalid results were obtained.  If clinically indicated, additional testing on a new specimen with an alternate test methodology [phone]) is advised.  The SARS CoV 2 RNA is generally detectable in upper and lower respiratory specimens during the acute phase of infection. The expected result is Negative. Fact  Sheet for Patients:  G[employer]ing[employer].no Fact Sheet for Healthcare Providers: https://www.hernandez-brewer.com/ This test is not yet approved or cleared by the [place] FDA and has been authorized for detection and/or diagnosis of SARS CoV 2 by FDA under an Emergency Use Authorization (EUA).  This EUA will remain in effect (meaning this test can be used) for the duration of the COVID19 d eclaration under Section 564(b)(1) of the Act, 21 U.S.C. section [phone] 3(b)(1), unless the authorization is terminated or revoked sooner. Performed at [facility], [address] [place], [place] [address]          Radiology Studies: Dg Chest 2 View  Result Date: 10/19/2018 CLINICAL DATA:  Fever EXAM: CHEST - 2 VIEW COMPARISON:  05/30/2017 FINDINGS: The heart size and mediastinal contours are within normal limits. Both lungs are clear. The visualized skeletal structures are unremarkable. IMPRESSION: No active cardiopulmonary disease. Electronically Signed   By: Constance Holster M.D.   On: 10/19/2018 22:45   Ct Renal Stone Study  Result Date: 10/19/2018 CLINICAL DATA:  Right-sided flank pain. The patient has a fever and is status post recent lithotripsy. EXAM: CT ABDOMEN AND PELVIS WITHOUT CONTRAST TECHNIQUE: Multidetector CT imaging of the abdomen and pelvis was performed following the standard protocol without IV contrast. COMPARISON:  CT dated 08/19/2018 FINDINGS: Lower chest: No acute abnormality. Hepatobiliary: There is a patent steatosis. The gallbladder is unremarkable. Pancreas: Unremarkable. No pancreatic ductal dilatation or surrounding inflammatory changes. Spleen: Normal in size without focal abnormality. Adrenals/Urinary Tract: Multiple right-sided kidney stones are noted measuring up to approximately 4-5 mm. There is no right-sided hydronephrosis. There are no right-sided ureteral stones. There is fat stranding about the left kidney. Multiple  left-sided kidney stones are noted. The largest is located in the upper pole measures approximately 1 cm. There is mild pelviectasis. Findings are suspicious for a very small 1 mm stone in the proximal left ureter. A small stone is noted in the dependent portion of the urinary bladder on the left. The adrenal glands are unremarkable. Stomach/Bowel: Stomach is within normal limits. Appendix appears normal. No evidence of bowel wall thickening, distention, or inflammatory changes. Vascular/Lymphatic: No significant vascular findings are present. No enlarged abdominal or pelvic lymph nodes. Reproductive: Status post hysterectomy. No adnexal masses. Other: No abdominal wall hernia  or abnormality. No abdominopelvic ascites. Musculoskeletal: No fracture is seen.  A sacral stimulator is noted. IMPRESSION: 1. Again identified is bilateral nephrolithiasis, left worse than right. There is new fat stranding about the left kidney which may be related to the patient's recent intervention. However, correlation with urinalysis is recommended to exclude an infectious process. 2. Overall stone burden appears similar to prior study. There may be a very faint punctate minimally obstructing stone in the proximal left ureter. There is mild left-sided pelviectasis. 3. Small stone in the urinary bladder. 4. Hepatic steatosis. Electronically Signed   By: Constance Holster M.D.   On: 10/19/2018 22:41        Scheduled Meds: . atorvastatin  40 mg Oral Daily  . lisinopril  10 mg Oral Daily  . oxybutynin  5 mg Oral TID  . pregabalin  100 mg Oral BID  . SUMAtriptan  6 mg Subcutaneous Once  . tamsulosin  0.4 mg Oral Daily  . traZODone  200 mg Oral QHS  . venlafaxine XR  150 mg Oral Q breakfast   Continuous Infusions: . cefTRIAXone (ROCEPHIN)  IV 2 g (10/20/18 2114)     LOS: 1 day    Time spent: 25 mins.More than 50% of that time was spent in counseling and/or coordination of care.      Shelly Coss, MD [facility] Pager [phone]  If 7PM-7AM, please contact night-coverage [email or web address] Password [facility] 10/21/2018, 10:57 AM

## 2018-10-21 NOTE — Plan of Care (Signed)
  Problem: Education: Goal: Knowledge of General Education information will improve Description Including pain rating scale, medication(s)/side effects and non-pharmacologic comfort measures Outcome: Progressing   Problem: Health Behavior/Discharge Planning: Goal: Ability to manage health-related needs will improve Outcome: Progressing   Problem: Clinical Measurements: Goal: Ability to maintain clinical measurements within normal limits will improve Outcome: Progressing Goal: Will remain free from infection Outcome: Progressing Goal: Diagnostic test results will improve Outcome: Progressing Goal: Respiratory complications will improve Outcome: Progressing Goal: Cardiovascular complication will be avoided Outcome: Progressing   Problem: Activity: Goal: Risk for activity intolerance will decrease Outcome: Progressing   Problem: Nutrition: Goal: Adequate nutrition will be maintained Outcome: Progressing   Problem: Coping: Goal: Level of anxiety will decrease Outcome: Progressing   Problem: Pain Managment: Goal: General experience of comfort will improve Outcome: Progressing   Problem: Skin Integrity: Goal: Risk for impaired skin integrity will decrease Outcome: Progressing

## 2018-10-21 NOTE — Progress Notes (Signed)
No flank pain Temp 99 Headache moderate- speak to primary team C/S pending No stent needed Passed another small fragment

## 2018-10-22 LAB — CULTURE, BLOOD (ROUTINE X 2): Special Requests: ADEQUATE

## 2018-10-22 LAB — CBC WITH DIFFERENTIAL/PLATELET
Abs Immature Granulocytes: 0.03 10*3/uL (ref 0.00–0.07)
Basophils Absolute: 0 10*3/uL (ref 0.0–0.1)
Basophils Relative: 0 %
Eosinophils Absolute: 0 10*3/uL (ref 0.0–0.5)
Eosinophils Relative: 0 %
HCT: 38.6 % (ref 36.0–46.0)
Hemoglobin: 11.4 g/dL — ABNORMAL LOW (ref 12.0–15.0)
Immature Granulocytes: 0 %
Lymphocytes Relative: 10 %
Lymphs Abs: 0.9 10*3/uL (ref 0.7–4.0)
MCH: 27.3 pg (ref 26.0–34.0)
MCHC: 29.5 g/dL — ABNORMAL LOW (ref 30.0–36.0)
MCV: 92.6 fL (ref 80.0–100.0)
Monocytes Absolute: 0.3 10*3/uL (ref 0.1–1.0)
Monocytes Relative: 3 %
Neutro Abs: 7.7 10*3/uL (ref 1.7–7.7)
Neutrophils Relative %: 87 %
Platelets: 227 10*3/uL (ref 150–400)
RBC: 4.17 MIL/uL (ref 3.87–5.11)
RDW: 13.2 % (ref 11.5–15.5)
WBC: 8.8 10*3/uL (ref 4.0–10.5)
nRBC: 0 % (ref 0.0–0.2)

## 2018-10-22 LAB — URINE CULTURE: Culture: >=100000 — AB

## 2018-10-22 MED ORDER — CEPHALEXIN 500 MG PO CAPS: 500.0000 mg | ORAL_CAPSULE | Freq: Three times a day (TID) | ORAL | Status: DC

## 2018-10-22 MED ORDER — SUMATRIPTAN SUCCINATE 50 MG PO TABS: 50.0000 mg | ORAL_TABLET | ORAL | 0 refills | Status: DC | PRN

## 2018-10-22 MED ORDER — CEPHALEXIN 500 MG PO CAPS: 500.0000 mg | ORAL_CAPSULE | Freq: Three times a day (TID) | ORAL | 0 refills | Status: AC

## 2018-10-22 NOTE — Discharge Summary (Signed)
Physician Discharge Summary  [name] NWG:[id number] DOB: 23-Nov-1970 DOA: 10/19/2018  PCP: System, Pcp Not In  Admit date: 10/19/2018 Discharge date: 10/22/2018  Admitted From: Home Disposition:  Home  Discharge Condition:Stable CODE STATUS:FULL Diet recommendation: Heart Healthy   Brief/Interim Summary:  Patient is a 48 year old female with history of nephrolithiasis, seizure disorder, depression, bipolar disorder, fibromyalgia, chronic pain syndrome, Gullien Barre syndrome who presented to the emergency room with complaints of fever, chills, dysuria, worsening bilateral flank pain.  She recently had lithotripsy procedure 2 days ago for a 12 mm kidney stone at [facility]. On presentation she was febrile, tachycardic, had leukocytosis.  UA showed moderate leukocytes.  CT renal study showed bilateral nephrolithiasis left worse than right.  Patient was suspected to have urinary tract infection and admitted for IV antibiotics. Urine culture, blood culture sent .Urology was following.  Blood cultures/urine culture  Showed pansensitive  Klebsiella  Pneumoniae. Hospital course remarkable for persistent migraine headache.  Her headache has resolved this morning.  Currently she is hemodynamically stable, afebrile.  She is stable for discharge to home with oral antibiotics.  Following problems were addressed during her hospitalization:  Gram-negative bacteremia: Most likely associated with recent urological procedure.  Blood cultures ,urine culture showed pansensitive   Klebsiella  Pneumoniae. Antibiotics changed to Keflex.  Currently afebrile and hemodynamically stable.  Pyelonephritis with sepsis: Presented with fever, dysuria, bilateral flank pain.Resolved.  Nephrolithiasis: Recent left-sided nephrolithotripsy for 12 mm stone.  CT showed very faint punctate minimally obstructing stone in the proximal left ureter.  Urology was  following.  Migraine headache: given combination  of Benadryl, ketorolac and Compazine.  Also given a dose of subcutaneous sumatriptan,steroids.  Headache resolved this morning.  Seizure disorder: Not on antiseizure medications.  Depression/fibromyalgia: Resume her home medications  Hyperlipidemia: Continue statin     Discharge Diagnoses:  Active Problems:   Pyelonephritis    Discharge Instructions  Discharge Instructions    Diet - low sodium heart healthy   Complete by:  As directed    Discharge instructions   Complete by:  As directed    1)Please follow up with her PCP, urologist 2) Take prescribed medications as instructed.   Increase activity slowly   Complete by:  As directed      Allergies as of 10/22/2018      Reactions   Augmentin [amoxicillin-pot Clavulanate] Nausea And Vomiting   Has patient had a PCN reaction causing immediate rash, facial/tongue/throat swelling, SOB or lightheadedness with hypotension: No Has patient had a PCN reaction causing severe rash involving mucus membranes or skin necrosis: No Has patient had a PCN reaction that required hospitalization: No Has patient had a PCN reaction occurring within the last 10 years: Yes If all of the above answers are "NO", then may proceed with Cephalosporin use.   Bisacodyl Other (See Comments)   blisters blisters   Adhesive [tape] Other (See Comments)   Irritates skin      Medication List    TAKE these medications   acetaminophen 500 MG tablet Commonly known as:  TYLENOL Take 1,000 mg by mouth every 6 (six) hours as needed for moderate pain.   atorvastatin 40 MG tablet Commonly known as:  LIPITOR Take 1 tablet (40 mg total) by mouth daily.   cephALEXin 500 MG capsule Commonly known as:  KEFLEX Take 1 capsule (500 mg total) by mouth every 8 (eight) hours for 12 days.   hydrOXYzine 50 MG capsule Commonly known as:  VISTARIL Take 100  mg by mouth at bedtime.   ibuprofen 200 MG tablet Commonly known as:  ADVIL Take 800 mg by mouth every 6  (six) hours as needed for moderate pain.   lisinopril 10 MG tablet Commonly known as:  ZESTRIL Take 1 tablet (10 mg total) by mouth daily.   LORazepam 0.5 MG tablet Commonly known as:  ATIVAN Take 0.5 mg by mouth daily as needed for anxiety.   Lyrica 100 MG capsule Generic drug:  pregabalin Take 100 mg by mouth 2 (two) times daily.   ondansetron 4 MG disintegrating tablet Commonly known as:  Zofran ODT Take 1 tablet (4 mg total) by mouth every 8 (eight) hours as needed for nausea or vomiting.   oxybutynin 5 MG tablet Commonly known as:  DITROPAN Take 1 tablet (5 mg total) 3 (three) times daily by mouth.   oxyCODONE-acetaminophen 5-325 MG tablet Commonly known as:  PERCOCET/ROXICET Take 1-2 tablets by mouth every 4 (four) hours as needed for severe pain.   SUMAtriptan 50 MG tablet Commonly known as:  IMITREX Take 1 tablet (50 mg total) by mouth every 2 (two) hours as needed for migraine or headache. May repeat in 2 hours if headache persists or recurs.   tamsulosin 0.4 MG Caps capsule Commonly known as:  FLOMAX Take 0.4 mg by mouth daily.   traZODone 100 MG tablet Commonly known as:  DESYREL Take 200 mg by mouth at bedtime.   venlafaxine XR 75 MG 24 hr capsule Commonly known as:  EFFEXOR-XR Take 150 mg by mouth daily with breakfast.       Allergies  Allergen Reactions  . Augmentin [Amoxicillin-Pot Clavulanate] Nausea And Vomiting    Has patient had a PCN reaction causing immediate rash, facial/tongue/throat swelling, SOB or lightheadedness with hypotension: No Has patient had a PCN reaction causing severe rash involving mucus membranes or skin necrosis: No Has patient had a PCN reaction that required hospitalization: No Has patient had a PCN reaction occurring within the last 10 years: Yes If all of the above answers are "NO", then may proceed with Cephalosporin use.   . Bisacodyl Other (See Comments)    blisters  blisters  . Adhesive [Tape] Other (See  Comments)    Irritates skin    Consultations:  Urology   Procedures/Studies: Dg Chest 2 View  Result Date: 10/19/2018 CLINICAL DATA:  Fever EXAM: CHEST - 2 VIEW COMPARISON:  05/30/2017 FINDINGS: The heart size and mediastinal contours are within normal limits. Both lungs are clear. The visualized skeletal structures are unremarkable. IMPRESSION: No active cardiopulmonary disease. Electronically Signed   By: Constance Holster M.D.   On: 10/19/2018 22:45   Ct Renal Stone Study  Result Date: 10/19/2018 CLINICAL DATA:  Right-sided flank pain. The patient has a fever and is status post recent lithotripsy. EXAM: CT ABDOMEN AND PELVIS WITHOUT CONTRAST TECHNIQUE: Multidetector CT imaging of the abdomen and pelvis was performed following the standard protocol without IV contrast. COMPARISON:  CT dated 08/19/2018 FINDINGS: Lower chest: No acute abnormality. Hepatobiliary: There is a patent steatosis. The gallbladder is unremarkable. Pancreas: Unremarkable. No pancreatic ductal dilatation or surrounding inflammatory changes. Spleen: Normal in size without focal abnormality. Adrenals/Urinary Tract: Multiple right-sided kidney stones are noted measuring up to approximately 4-5 mm. There is no right-sided hydronephrosis. There are no right-sided ureteral stones. There is fat stranding about the left kidney. Multiple left-sided kidney stones are noted. The largest is located in the upper pole measures approximately 1 cm. There is mild pelviectasis. Findings are  suspicious for a very small 1 mm stone in the proximal left ureter. A small stone is noted in the dependent portion of the urinary bladder on the left. The adrenal glands are unremarkable. Stomach/Bowel: Stomach is within normal limits. Appendix appears normal. No evidence of bowel wall thickening, distention, or inflammatory changes. Vascular/Lymphatic: No significant vascular findings are present. No enlarged abdominal or pelvic lymph nodes. Reproductive:  Status post hysterectomy. No adnexal masses. Other: No abdominal wall hernia or abnormality. No abdominopelvic ascites. Musculoskeletal: No fracture is seen.  A sacral stimulator is noted. IMPRESSION: 1. Again identified is bilateral nephrolithiasis, left worse than right. There is new fat stranding about the left kidney which may be related to the patient's recent intervention. However, correlation with urinalysis is recommended to exclude an infectious process. 2. Overall stone burden appears similar to prior study. There may be a very faint punctate minimally obstructing stone in the proximal left ureter. There is mild left-sided pelviectasis. 3. Small stone in the urinary bladder. 4. Hepatic steatosis. Electronically Signed   By: Constance Holster M.D.   On: 10/19/2018 22:41       Subjective: Patient seen and examined the bedside this morning.  Remains comfortable today.  No headache.  No fever.  Stable for discharge  Discharge Exam: Vitals:   10/21/18 2022 10/22/18 0427  BP: 137/82 (!) 151/92  Pulse: 75 64  Resp: (!) 24 (!) 22  Temp: 98.6 F (37 C) 98.2 F (36.8 C)  SpO2: 93% 90%   Vitals:   10/21/18 0932 10/21/18 1417 10/21/18 2022 10/22/18 0427  BP: 139/70 113/60 137/82 (!) 151/92  Pulse: 96 74 75 64  Resp: 18 16 (!) 24 (!) 22  Temp: 100.1 F (37.8 C) 98.5 F (36.9 C) 98.6 F (37 C) 98.2 F (36.8 C)  TempSrc: Oral Oral Oral Oral  SpO2:  98% 93% 90%  Weight:      Height:        General: Pt is alert, awake, not in acute distress Cardiovascular: RRR, S1/S2 +, no rubs, no gallops Respiratory: CTA bilaterally, no wheezing, no rhonchi Abdominal: Soft, NT, ND, bowel sounds + Extremities: no edema, no cyanosis    The results of significant diagnostics from this hospitalization (including imaging, microbiology, ancillary and laboratory) are listed below for reference.     Microbiology: Recent Results (from the past 240 hour(s))  Urine culture     Status: Abnormal    Collection Time: 10/19/18  8:15 PM  Result Value Ref Range Status   Specimen Description   Final    URINE, RANDOM Performed at [facility], [address] [place], [place] [address]    Special Requests   Final    NONE Performed at [facility], [address] [place], [place] [address]    Culture >=100,000 COLONIES/mL KLEBSIELLA PNEUMONIAE (A)  Final   Report Status 10/22/2018 FINAL  Final   Organism ID, Bacteria KLEBSIELLA PNEUMONIAE (A)  Final      Susceptibility   Klebsiella pneumoniae - MIC*    AMPICILLIN >=32 RESISTANT Resistant     CEFAZOLIN <=4 SENSITIVE Sensitive     CEFTRIAXONE <=1 SENSITIVE Sensitive     CIPROFLOXACIN <=0.25 SENSITIVE Sensitive     GENTAMICIN <=1 SENSITIVE Sensitive     IMIPENEM <=0.25 SENSITIVE Sensitive     NITROFURANTOIN 128 RESISTANT Resistant     TRIMETH/SULFA <=20 SENSITIVE Sensitive     AMPICILLIN/SULBACTAM 8 SENSITIVE Sensitive     PIP/TAZO 8  SENSITIVE Sensitive     Extended ESBL NEGATIVE Sensitive     * >=100,000 COLONIES/mL KLEBSIELLA PNEUMONIAE  Blood culture (routine x 2)     Status: None (Preliminary result)   Collection Time: 10/19/18  9:30 PM  Result Value Ref Range Status   Specimen Description   Final    BLOOD RIGHT ARM Performed at [facility], [address] [place], [place] [address]    Special Requests   Final    BOTTLES DRAWN AEROBIC AND ANAEROBIC Blood Culture adequate volume Performed at [facility], [address] [place], [place] [address]    Culture   Final    NO GROWTH 3 DAYS Performed at [facility], [place] [address] [place], [place] [address]    Report Status PENDING  Incomplete  Blood culture (routine x 2)     Status: Abnormal   Collection Time: 10/19/18 10:40 PM  Result Value Ref Range Status   Specimen Description   Final    BLOOD LEFT HAND Performed at [facility], [address] [place], [place] [address]    Special Requests   Final     BOTTLES DRAWN AEROBIC AND ANAEROBIC Blood Culture adequate volume Performed at [facility], [address] [place], [place] [address]    Culture  Setup Time   Final    GRAM NEGATIVE RODS ANAEROBIC BOTTLE ONLY CRITICAL RESULT CALLED TO, READ BACK BY AND VERIFIED WITH: J.GRIMSLEY,PHARMD 0105 10/21/2018 M.CAMPBELL Performed at [facility], [place] [address] [place], [place] [address]    Culture KLEBSIELLA PNEUMONIAE (A)  Final   Report Status 10/22/2018 FINAL  Final   Organism ID, Bacteria KLEBSIELLA PNEUMONIAE  Final      Susceptibility   Klebsiella pneumoniae - MIC*    AMPICILLIN >=32 RESISTANT Resistant     CEFAZOLIN <=4 SENSITIVE Sensitive     CEFEPIME <=1 SENSITIVE Sensitive     CEFTAZIDIME <=1 SENSITIVE Sensitive     CEFTRIAXONE <=1 SENSITIVE Sensitive     CIPROFLOXACIN 0.5 SENSITIVE Sensitive     GENTAMICIN <=1 SENSITIVE Sensitive     IMIPENEM <=0.25 SENSITIVE Sensitive     TRIMETH/SULFA <=20 SENSITIVE Sensitive     AMPICILLIN/SULBACTAM 8 SENSITIVE Sensitive     PIP/TAZO <=4 SENSITIVE Sensitive     Extended ESBL NEGATIVE Sensitive     * KLEBSIELLA PNEUMONIAE  Blood Culture ID Panel (Reflexed)     Status: Abnormal   Collection Time: 10/19/18 10:40 PM  Result Value Ref Range Status   Enterococcus species NOT DETECTED NOT DETECTED Final   Listeria monocytogenes NOT DETECTED NOT DETECTED Final   Staphylococcus species NOT DETECTED NOT DETECTED Final   Staphylococcus aureus (BCID) NOT DETECTED NOT DETECTED Final   Streptococcus species NOT DETECTED NOT DETECTED Final   Streptococcus agalactiae NOT DETECTED NOT DETECTED Final   Streptococcus pneumoniae NOT DETECTED NOT DETECTED Final   Streptococcus pyogenes NOT DETECTED NOT DETECTED Final   Acinetobacter baumannii NOT DETECTED NOT DETECTED Final   Enterobacteriaceae species DETECTED (A) NOT DETECTED Final    Comment: Enterobacteriaceae represent a large family of gram-negative bacteria, not a single  organism. CRITICAL RESULT CALLED TO, READ BACK BY AND VERIFIED WITH: J.GRIMSLEY,PHARMD 0105 10/21/2018 M.CAMPBELL    Enterobacter cloacae complex NOT DETECTED NOT DETECTED Final   Escherichia coli NOT DETECTED NOT DETECTED Final   Klebsiella oxytoca NOT DETECTED NOT DETECTED Final   Klebsiella pneumoniae DETECTED (A) NOT DETECTED  Final    Comment: CRITICAL RESULT CALLED TO, READ BACK BY AND VERIFIED WITH: J.GRIMSLEY,PHARMD 0105 10/21/2018 M.CAMPBELL    Proteus species NOT DETECTED NOT DETECTED Final   Serratia marcescens NOT DETECTED NOT DETECTED Final   Carbapenem resistance NOT DETECTED NOT DETECTED Final   Haemophilus influenzae NOT DETECTED NOT DETECTED Final   Neisseria meningitidis NOT DETECTED NOT DETECTED Final   Pseudomonas aeruginosa NOT DETECTED NOT DETECTED Final   Candida albicans NOT DETECTED NOT DETECTED Final   Candida glabrata NOT DETECTED NOT DETECTED Final   Candida krusei NOT DETECTED NOT DETECTED Final   Candida parapsilosis NOT DETECTED NOT DETECTED Final   Candida tropicalis NOT DETECTED NOT DETECTED Final    Comment: Performed at [facility], [place] [address], [place], [place] [address]  SARS Coronavirus 2 (Hosp order,Performed in [facility] lab via [employer] [facility])     Status: None   Collection Time: 10/20/18 12:20 AM  Result Value Ref Range Status   SARS Coronavirus 2 ([employer] Now) NEGATIVE NEGATIVE Final    Comment: (NOTE) Interpretive Result Comment(s): COVID 19 Positive SARS CoV 2 target nucleic acids are DETECTED. The SARS CoV 2 RNA is generally detectable in upper and lower respiratory specimens during the acute phase of infection.  Positive results are indicative of active infection with SARS CoV 2.  Clinical correlation with patient history and other diagnostic information is necessary to determine patient infection status.  Positive results do not rule out bacterial infection or coinfection with other viruses. The expected result is  Negative. COVID 19 Negative SARS CoV 2 target nucleic acids are NOT DETECTED. The SARS CoV 2 RNA is generally detectable in upper and lower respiratory specimens during the acute phase of infection.  Negative results do not preclude SARS CoV 2 infection, do not rule out coinfections with other pathogens, and should not be used as the sole basis for treatment or other patient management decisions.  Negative results must be combined with clinical  observations, patient history, and epidemiological information. The expected result is Negative. Invalid Presence or absence of SARS CoV 2 nucleic acids cannot be determined. Repeat testing was performed on the submitted specimen and repeated Invalid results were obtained.  If clinically indicated, additional testing on a new specimen with an alternate test methodology [phone]) is advised.  The SARS CoV 2 RNA is generally detectable in upper and lower respiratory specimens during the acute phase of infection. The expected result is Negative. Fact Sheet for Patients:  GolfingFamily.no Fact Sheet for Healthcare Providers: https://www.hernandez-brewer.com/ This test is not yet approved or cleared by the [place] FDA and has been authorized for detection and/or diagnosis of SARS CoV 2 by FDA under an Emergency Use Authorization (EUA).  This EUA will remain in effect (meaning this test can be used) for the duration of the COVID19 d eclaration under Section 564(b)(1) of the Act, 21 U.S.C. section [phone] 3(b)(1), unless the authorization is terminated or revoked sooner. Performed at [facility], [address] [place], [place] [address]      Labs: BNP (last 3 results) No results for input(s): BNP in the last 8760 hours. Basic Metabolic Panel: Recent Labs  Lab 10/19/18 2106 10/20/18 0516  NA 132* 135  K 3.9 3.9  CL 99 103  CO2 23 24  GLUCOSE 109* 111*  BUN 12 10  CREATININE 0.68  0.76  CALCIUM 9.3 8.5*   Liver Function Tests: Recent Labs  Lab 10/19/18 2106  AST  18  ALT 25  ALKPHOS 98  BILITOT 0.8  PROT 7.2  ALBUMIN 3.9   Recent Labs  Lab 10/19/18 2106  LIPASE 27   No results for input(s): AMMONIA in the last 168 hours. CBC: Recent Labs  Lab 10/19/18 2106 10/20/18 0516 10/22/18 0414  WBC 16.0* 12.9* 8.8  NEUTROABS 13.3*  --  7.7  HGB 12.8 11.4* 11.4*  HCT 40.3 36.0 38.6  MCV 86.5 89.3 92.6  PLT 244 198 227   Cardiac Enzymes: No results for input(s): CKTOTAL, CKMB, CKMBINDEX, TROPONINI in the last 168 hours. BNP: Invalid input(s): POCBNP CBG: No results for input(s): GLUCAP in the last 168 hours. D-Dimer No results for input(s): DDIMER in the last 72 hours. Hgb A1c No results for input(s): HGBA1C in the last 72 hours. Lipid Profile No results for input(s): CHOL, HDL, LDLCALC, TRIG, CHOLHDL, LDLDIRECT in the last 72 hours. Thyroid function studies No results for input(s): TSH, T4TOTAL, T3FREE, THYROIDAB in the last 72 hours.  Invalid input(s): FREET3 Anemia work up No results for input(s): VITAMINB12, FOLATE, FERRITIN, TIBC, IRON, RETICCTPCT in the last 72 hours. Urinalysis    Component Value Date/Time   COLORURINE YELLOW 10/19/2018 2015   APPEARANCEUR CLOUDY (A) 10/19/2018 2015   LABSPEC 1.020 10/19/2018 2015   PHURINE 7.0 10/19/2018 2015   GLUCOSEU NEGATIVE 10/19/2018 2015   HGBUR MODERATE (A) 10/19/2018 2015   [place] NEGATIVE 10/19/2018 2015   KETONESUR NEGATIVE 10/19/2018 2015   PROTEINUR 30 (A) 10/19/2018 2015   UROBILINOGEN 1.0 12/05/2013 0545   NITRITE NEGATIVE 10/19/2018 2015   LEUKOCYTESUR MODERATE (A) 10/19/2018 2015   Sepsis Labs Invalid input(s): PROCALCITONIN,  WBC,  LACTICIDVEN Microbiology Recent Results (from the past 240 hour(s))  Urine culture     Status: Abnormal   Collection Time: 10/19/18  8:15 PM  Result Value Ref Range Status   Specimen Description   Final    URINE, RANDOM Performed at [facility], [address] [place], [place] [address]    Special Requests   Final    NONE Performed at [facility], [address] [place], [place] [address]    Culture >=100,000 COLONIES/mL KLEBSIELLA PNEUMONIAE (A)  Final   Report Status 10/22/2018 FINAL  Final   Organism ID, Bacteria KLEBSIELLA PNEUMONIAE (A)  Final      Susceptibility   Klebsiella pneumoniae - MIC*    AMPICILLIN >=32 RESISTANT Resistant     CEFAZOLIN <=4 SENSITIVE Sensitive     CEFTRIAXONE <=1 SENSITIVE Sensitive     CIPROFLOXACIN <=0.25 SENSITIVE Sensitive     GENTAMICIN <=1 SENSITIVE Sensitive     IMIPENEM <=0.25 SENSITIVE Sensitive     NITROFURANTOIN 128 RESISTANT Resistant     TRIMETH/SULFA <=20 SENSITIVE Sensitive     AMPICILLIN/SULBACTAM 8 SENSITIVE Sensitive     PIP/TAZO 8 SENSITIVE Sensitive     Extended ESBL NEGATIVE Sensitive     * >=100,000 COLONIES/mL KLEBSIELLA PNEUMONIAE  Blood culture (routine x 2)     Status: None (Preliminary result)   Collection Time: 10/19/18  9:30 PM  Result Value Ref Range Status   Specimen Description   Final    BLOOD RIGHT ARM Performed at [facility], [address] [place], [place] [address]    Special Requests   Final    BOTTLES DRAWN AEROBIC AND ANAEROBIC Blood Culture adequate volume Performed at [facility], [place], [address] [place], [place] [address]    Culture  Final    NO GROWTH 3 DAYS Performed at [facility], [place] [address], [place], [place] [address]    Report Status PENDING  Incomplete  Blood culture (routine x 2)     Status: Abnormal   Collection Time: 10/19/18 10:40 PM  Result Value Ref Range Status   Specimen Description   Final    BLOOD LEFT HAND Performed at [facility], [address] [place], [place] [address]    Special Requests   Final    BOTTLES DRAWN AEROBIC AND ANAEROBIC Blood Culture adequate volume Performed at [facility], [address] [place], [place] [address]    Culture  Setup Time   Final    GRAM NEGATIVE RODS ANAEROBIC BOTTLE ONLY CRITICAL RESULT CALLED TO, READ BACK BY AND VERIFIED WITH: J.GRIMSLEY,PHARMD 0105 10/21/2018 M.CAMPBELL Performed at [facility], [place] [address] [place], [place] [address]    Culture KLEBSIELLA PNEUMONIAE (A)  Final   Report Status 10/22/2018 FINAL  Final   Organism ID, Bacteria KLEBSIELLA PNEUMONIAE  Final      Susceptibility   Klebsiella pneumoniae - MIC*    AMPICILLIN >=32 RESISTANT Resistant     CEFAZOLIN <=4 SENSITIVE Sensitive     CEFEPIME <=1 SENSITIVE Sensitive     CEFTAZIDIME <=1 SENSITIVE Sensitive     CEFTRIAXONE <=1 SENSITIVE Sensitive     CIPROFLOXACIN 0.5 SENSITIVE Sensitive     GENTAMICIN <=1 SENSITIVE Sensitive     IMIPENEM <=0.25 SENSITIVE Sensitive     TRIMETH/SULFA <=20 SENSITIVE Sensitive     AMPICILLIN/SULBACTAM 8 SENSITIVE Sensitive     PIP/TAZO <=4 SENSITIVE Sensitive     Extended ESBL NEGATIVE Sensitive     * KLEBSIELLA PNEUMONIAE  Blood Culture ID Panel (Reflexed)     Status: Abnormal   Collection Time: 10/19/18 10:40 PM  Result Value Ref Range Status   Enterococcus species NOT DETECTED NOT DETECTED Final   Listeria monocytogenes NOT DETECTED NOT DETECTED Final   Staphylococcus species NOT DETECTED NOT DETECTED Final   Staphylococcus aureus (BCID) NOT DETECTED NOT DETECTED Final   Streptococcus species NOT DETECTED NOT DETECTED Final   Streptococcus agalactiae NOT DETECTED NOT DETECTED Final   Streptococcus pneumoniae NOT DETECTED NOT DETECTED Final   Streptococcus pyogenes NOT DETECTED NOT DETECTED Final   Acinetobacter baumannii NOT DETECTED NOT DETECTED Final   Enterobacteriaceae species DETECTED (A) NOT DETECTED Final    Comment: Enterobacteriaceae represent a large family of gram-negative bacteria, not a single organism. CRITICAL RESULT CALLED TO, READ BACK BY AND VERIFIED WITH: J.GRIMSLEY,PHARMD 0105 10/21/2018 M.CAMPBELL    Enterobacter cloacae  complex NOT DETECTED NOT DETECTED Final   Escherichia coli NOT DETECTED NOT DETECTED Final   Klebsiella oxytoca NOT DETECTED NOT DETECTED Final   Klebsiella pneumoniae DETECTED (A) NOT DETECTED Final    Comment: CRITICAL RESULT CALLED TO, READ BACK BY AND VERIFIED WITH: J.GRIMSLEY,PHARMD 0105 10/21/2018 M.CAMPBELL    Proteus species NOT DETECTED NOT DETECTED Final   Serratia marcescens NOT DETECTED NOT DETECTED Final   Carbapenem resistance NOT DETECTED NOT DETECTED Final   Haemophilus influenzae NOT DETECTED NOT DETECTED Final   Neisseria meningitidis NOT DETECTED NOT DETECTED Final   Pseudomonas aeruginosa NOT DETECTED NOT DETECTED Final   Candida albicans NOT DETECTED NOT DETECTED Final   Candida glabrata NOT DETECTED NOT DETECTED Final   Candida krusei NOT DETECTED NOT DETECTED Final   Candida parapsilosis NOT DETECTED NOT DETECTED Final   Candida tropicalis  NOT DETECTED NOT DETECTED Final    Comment: Performed at [facility], [place] [address], [place], [place] [address]  SARS Coronavirus 2 (Hosp order,Performed in [place] [facility] lab via Abbott ID)     Status: None   Collection Time: 10/20/18 12:20 AM  Result Value Ref Range Status   SARS Coronavirus 2 (Abbott ID Now) NEGATIVE NEGATIVE Final    Comment: (NOTE) Interpretive Result Comment(s): COVID 19 Positive SARS CoV 2 target nucleic acids are DETECTED. The SARS CoV 2 RNA is generally detectable in upper and lower respiratory specimens during the acute phase of infection.  Positive results are indicative of active infection with SARS CoV 2.  Clinical correlation with patient history and other diagnostic information is necessary to determine patient infection status.  Positive results do not rule out bacterial infection or coinfection with other viruses. The expected result is Negative. COVID 19 Negative SARS CoV 2 target nucleic acids are NOT DETECTED. The SARS CoV 2 RNA is generally detectable in upper and  lower respiratory specimens during the acute phase of infection.  Negative results do not preclude SARS CoV 2 infection, do not rule out coinfections with other pathogens, and should not be used as the sole basis for treatment or other patient management decisions.  Negative results must be combined with clinical  observations, patient history, and epidemiological information. The expected result is Negative. Invalid Presence or absence of SARS CoV 2 nucleic acids cannot be determined. Repeat testing was performed on the submitted specimen and repeated Invalid results were obtained.  If clinically indicated, additional testing on a new specimen with an alternate test methodology [phone]) is advised.  The SARS CoV 2 RNA is generally detectable in upper and lower respiratory specimens during the acute phase of infection. The expected result is Negative. Fact Sheet for Patients:  GolfingFamily.no Fact Sheet for Healthcare Providers: https://www.hernandez-brewer.com/ This test is not yet approved or cleared by the [place] FDA and has been authorized for detection and/or diagnosis of SARS CoV 2 by FDA under an Emergency Use Authorization (EUA).  This EUA will remain in effect (meaning this test can be used) for the duration of the COVID19 d eclaration under Section 564(b)(1) of the Act, 21 U.S.C. section [phone] 3(b)(1), unless the authorization is terminated or revoked sooner. Performed at [facility], [address] [place],  [address]     Please note: You were cared for by a hospitalist during your hospital stay. Once you are discharged, your primary care physician will handle any further medical issues. Please note that NO REFILLS for any discharge medications will be authorized once you are discharged, as it is imperative that you return to your primary care physician (or establish a relationship with a primary care  physician if you do not have one) for your post hospital discharge needs so that they can reassess your need for medications and monitor your lab values.    Time coordinating discharge: 40 minutes  SIGNED:   Shelly Coss, MD  [facility] 10/22/2018, 9:48 AM Pager [phone]  If 7PM-7AM, please contact night-coverage [email or web address] Password [email or web address]

## 2018-10-22 NOTE — Progress Notes (Signed)
PT verbalized understanding of DC  instructions
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
## 2018-10-24 LAB — CULTURE, BLOOD (ROUTINE X 2)
Culture: NO GROWTH
Special Requests: ADEQUATE

## 2019-04-30 ENCOUNTER — Ambulatory Visit: Payer: 59 | Admitting: Internal Medicine

## 2019-06-07 ENCOUNTER — Ambulatory Visit (INDEPENDENT_AMBULATORY_CARE_PROVIDER_SITE_OTHER): Payer: 59 | Admitting: Internal Medicine

## 2019-06-07 ENCOUNTER — Encounter: Payer: Self-pay | Admitting: Internal Medicine

## 2019-06-07 VITALS — BP 108/60 | HR 74 | Temp 98.2°F | Ht 62.0 in | Wt 198.0 lb

## 2019-06-07 DIAGNOSIS — Z1159 Encounter for screening for other viral diseases: Secondary | ICD-10-CM

## 2019-06-07 DIAGNOSIS — M6289 Other specified disorders of muscle: Secondary | ICD-10-CM | POA: Diagnosis not present

## 2019-06-07 DIAGNOSIS — R1013 Epigastric pain: Secondary | ICD-10-CM

## 2019-06-07 DIAGNOSIS — R112 Nausea with vomiting, unspecified: Secondary | ICD-10-CM

## 2019-06-07 DIAGNOSIS — K5909 Other constipation: Secondary | ICD-10-CM | POA: Diagnosis not present
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
## 2019-06-07 NOTE — Patient Instructions (Signed)
You have been scheduled for an endoscopy. Please follow written instructions given to you at your visit today. If you use inhalers (even only as needed), please bring them with you on the day of your procedure.   We are giving you samples of Linzess 290 mcg. To try for your constipation. Take one daily on an empty stomach before breakfast.     I appreciate the opportunity to care for you. Silvano Rusk, MD, [facility]

## 2019-06-12 ENCOUNTER — Ambulatory Visit (INDEPENDENT_AMBULATORY_CARE_PROVIDER_SITE_OTHER): Payer: 59

## 2019-06-12 DIAGNOSIS — Z1159 Encounter for screening for other viral diseases: Secondary | ICD-10-CM

## 2019-06-13 LAB — SARS CORONAVIRUS 2 (TAT 6-24 HRS): SARS Coronavirus 2: NEGATIVE

## 2019-06-14 ENCOUNTER — Ambulatory Visit (AMBULATORY_SURGERY_CENTER): Payer: 59 | Admitting: Internal Medicine

## 2019-06-14 ENCOUNTER — Other Ambulatory Visit: Payer: Self-pay

## 2019-06-14 ENCOUNTER — Encounter: Payer: Self-pay | Admitting: Internal Medicine

## 2019-06-14 VITALS — BP 113/62 | HR 62 | Temp 98.3°F | Resp 21 | Ht 62.0 in | Wt 198.0 lb

## 2019-06-14 DIAGNOSIS — R1013 Epigastric pain: Secondary | ICD-10-CM | POA: Diagnosis not present

## 2019-06-14 MED ORDER — ONDANSETRON HCL 4 MG PO TABS: 8.0000 mg | ORAL_TABLET | Freq: Three times a day (TID) | ORAL | 3 refills | Status: DC | PRN

## 2019-06-14 MED ORDER — ONDANSETRON 8 MG PO TBDP: 8.0000 mg | ORAL_TABLET | Freq: Three times a day (TID) | ORAL | 3 refills | Status: DC | PRN

## 2019-06-14 MED ORDER — ONDANSETRON HCL 8 MG PO TABS: 8.0000 mg | ORAL_TABLET | Freq: Three times a day (TID) | ORAL | 2 refills | Status: DC | PRN

## 2019-06-14 MED ORDER — MOTEGRITY 2 MG PO TABS: 2.0000 mg | ORAL_TABLET | Freq: Every day | ORAL | 0 refills | Status: DC

## 2019-06-14 MED ORDER — SODIUM CHLORIDE 0.9 % IV SOLN: 500.0000 mL | Freq: Once | INTRAVENOUS | Status: DC

## 2019-06-14 NOTE — Op Note (Signed)
[place] Patient Name: [name] Procedure Date: 06/14/2019 7:55 AM MRN: [id number] Endoscopist: Gatha Mayer , MD Age: 49 Referring MD:  Date of Birth: 11/18/1970 Gender: Female Account #: [id number] Procedure:                Upper GI endoscopy Indications:              Epigastric abdominal pain Procedure:                Pre-Anesthesia Assessment:                           - Prior to the procedure, a History and Physical                            was performed, and patient medications and                            allergies were reviewed. The patient's tolerance of                            previous anesthesia was also reviewed. The risks                            and benefits of the procedure and the sedation                            options and risks were discussed with the patient.                            All questions were answered, and informed consent                            was obtained. Prior Anticoagulants: The patient has                            taken no previous anticoagulant or antiplatelet                            agents. ASA Grade Assessment: II - A patient with                            mild systemic disease. After reviewing the risks                            and benefits, the patient was deemed in                            satisfactory condition to undergo the procedure.                           After obtaining informed consent, the endoscope was                            passed under direct vision. Throughout the  procedure, the patient's blood pressure, pulse, and                            oxygen saturations were monitored continuously. The                            Endoscope was introduced through the mouth, and                            advanced to the second part of duodenum. The upper                            GI endoscopy was accomplished without difficulty.                            The  patient tolerated the procedure well. Scope In: Scope Out: Findings:                 The esophagus was normal.                           The stomach was normal. Did have dimple in body                            from prior gastrostomy                           The examined duodenum was normal.                           The cardia and gastric fundus were normal on                            retroflexion. Complications:            No immediate complications. Estimated Blood Loss:     Estimated blood loss: none. Impression:               - Normal esophagus.                           - Normal stomach. s/p gastrostomy                           - Normal examined duodenum.                           - No specimens collected. Recommendation:           - Resume previous diet.                           - Continue present medications.                           - Patient has a contact number available for  emergencies. The signs and symptoms of potential                            delayed complications were discussed with the                            patient. Return to normal activities tomorrow.                            Written discharge instructions were provided to the                            patient.                           - Trial of Motegrity 2 mg qd instead of Linzess Gatha Mayer, MD 06/14/2019 8:23:57 AM This report has been signed electronically.

## 2019-06-14 NOTE — Progress Notes (Signed)
Pt tolerated well. VSSS. Awake and to recovery.

## 2019-06-14 NOTE — Patient Instructions (Addendum)
Resume previous diet Continue present medications Begin motegrity 72m once every day       Things look ok so have to think the symptoms are coming from dysfunction of the gastrointestinal tract.  I am going to try Motegrity instead of Linzess - Motegrity may fix the nausea and vomiting and pain. Try the sampoles instead of Linzess and let me know.  Beware that ondansetron can constipate.   I printed the ondansetron Rx - go to [employer] website  to save $  I appreciate the opportunity to care for you. CGatha Mayer MD, FACG    YOU [facility]OS[facility]IC [facility]EDURE [facility]:   Refer to the procedure report that was given to you for any specific questions about what was found during the examination.  If the procedure report does not answer your questions, please call your gastroenterologist to clarify.  If you requested that your care partner not be given the details of your procedure findings, then the procedure report has been included in a sealed envelope for you to review at your convenience later.  YOU SHOULD EXPECT: Some feelings of bloating in the abdomen. Passage of more gas than usual.  Walking can help get rid of the air that was put into your GI tract during the procedure and reduce the bloating. If you had a lower endoscopy (such as a colonoscopy or flexible sigmoidoscopy) you may notice spotting of blood in your stool or on the toilet paper. If you underwent a bowel prep for your procedure, you may not have a normal bowel movement for a few days.  Please Note:  You might notice some irritation and congestion in your nose or some drainage.  This is from the oxygen used during your procedure.  There is no need for concern and it should clear up in a day or so.  SYMPTOMS TO REPORT IMMEDIATELY:   Following upper endoscopy (EGD)  Vomiting of blood or coffee ground material  New chest pain or pain under the shoulder blades  Painful or persistently  difficult swallowing  New shortness of breath  Fever of 100F or higher  Black, tarry-looking stools  For urgent or emergent issues, a gastroenterologist can be reached at any hour by calling ([phone]   DIET:  We do recommend a small meal at first, but then you may proceed to your regular diet.  Drink plenty of fluids but you should avoid alcoholic beverages for 24 hours.  ACTIVITY:  You should plan to take it easy for the rest of today and you should NOT DRIVE or use heavy machinery until tomorrow (because of the sedation medicines used during the test).    FOLLOW UP: Our staff will call the number listed on your records 48-72 hours following your procedure to check on you and address any questions or concerns that you may have regarding the information given to you following your procedure. If we do not reach you, we will leave a message.  We will attempt to reach you two times.  During this call, we will ask if you have developed any symptoms of COVID 19. If you develop any symptoms (ie: fever, flu-like symptoms, shortness of breath, cough etc.) before then, please call [phone]  If you test positive for Covid 19 in the 2 weeks post procedure, please call and report this information to [place]    If any biopsies were taken you will be contacted by phone or by letter within  the next 1-3 weeks.  Please call us at [phone] if you have not heard about the biopsies in 3 weeks.    SIGNATURES/CONFIDENTIALITY: You and/or your care partner have signed paperwork which will be entered into your electronic medical record.  These signatures attest to the fact that that the information above on your After Visit Summary has been reviewed and is understood.  Full responsibility of the confidentiality of this discharge information lies with you and/or your care-partner.

## 2019-06-14 NOTE — Progress Notes (Signed)
Temperature- [place]  Last seizure- only 1 grand mal seizure in 2001

## 2019-06-18 ENCOUNTER — Telehealth: Payer: Self-pay

## 2019-06-18 NOTE — Telephone Encounter (Signed)
Attempted to reach patient for post-procedure f/u call. No answer. Left message that we will make another attempt to reach her again later today and for her to please not hesitate to call us if she has any questions/concerns regarding her care. 

## 2019-06-18 NOTE — Telephone Encounter (Signed)
Second follow up phone call, no answer, LM

## 2019-07-24 ENCOUNTER — Other Ambulatory Visit: Payer: Self-pay | Admitting: Cardiovascular Disease

## 2019-07-28 ENCOUNTER — Ambulatory Visit: Payer: 59

## 2019-08-09 ENCOUNTER — Emergency Department (HOSPITAL_BASED_OUTPATIENT_CLINIC_OR_DEPARTMENT_OTHER): Payer: 59

## 2019-08-09 ENCOUNTER — Other Ambulatory Visit: Payer: Self-pay

## 2019-08-09 ENCOUNTER — Emergency Department (HOSPITAL_BASED_OUTPATIENT_CLINIC_OR_DEPARTMENT_OTHER)
Admission: EM | Admit: 2019-08-09 | Discharge: 2019-08-09 | Disposition: A | Discharge status: Home / Self Care | Payer: 59 | Attending: Emergency Medicine | Admitting: Emergency Medicine

## 2019-08-09 ENCOUNTER — Encounter (HOSPITAL_BASED_OUTPATIENT_CLINIC_OR_DEPARTMENT_OTHER): Payer: Self-pay

## 2019-08-09 DIAGNOSIS — K219 Gastro-esophageal reflux disease without esophagitis: Secondary | ICD-10-CM | POA: Diagnosis not present

## 2019-08-09 DIAGNOSIS — R1011 Right upper quadrant pain: Secondary | ICD-10-CM

## 2019-08-09 DIAGNOSIS — Z79899 Other long term (current) drug therapy: Secondary | ICD-10-CM | POA: Diagnosis not present

## 2019-08-09 DIAGNOSIS — R110 Nausea: Secondary | ICD-10-CM | POA: Diagnosis not present

## 2019-08-09 DIAGNOSIS — I10 Essential (primary) hypertension: Secondary | ICD-10-CM | POA: Diagnosis not present

## 2019-08-09 DIAGNOSIS — R52 Pain, unspecified: Secondary | ICD-10-CM

## 2019-08-09 DIAGNOSIS — M797 Fibromyalgia: Secondary | ICD-10-CM | POA: Insufficient documentation

## 2019-08-09 DIAGNOSIS — K589 Irritable bowel syndrome without diarrhea: Secondary | ICD-10-CM | POA: Diagnosis not present

## 2019-08-09 DIAGNOSIS — K828 Other specified diseases of gallbladder: Secondary | ICD-10-CM

## 2019-08-09 LAB — COMPREHENSIVE METABOLIC PANEL
ALT: 38 U/L (ref 0–44)
AST: 27 U/L (ref 15–41)
Albumin: 4.1 g/dL (ref 3.5–5.0)
Alkaline Phosphatase: 109 U/L (ref 38–126)
Anion gap: 8 (ref 5–15)
BUN: 15 mg/dL (ref 6–20)
CO2: 28 mmol/L (ref 22–32)
Calcium: 9.6 mg/dL (ref 8.9–10.3)
Chloride: 102 mmol/L (ref 98–111)
Creatinine, Ser: 0.74 mg/dL (ref 0.44–1.00)
GFR calc Af Amer: >60 mL/min (ref >60)
GFR calc non Af Amer: >60 mL/min (ref >60)
Glucose, Bld: 97 mg/dL (ref 70–99)
Potassium: 3.9 mmol/L (ref 3.5–5.1)
Sodium: 138 mmol/L (ref 135–145)
Total Bilirubin: 0.3 mg/dL (ref 0.3–1.2)
Total Protein: 7.5 g/dL (ref 6.5–8.1)

## 2019-08-09 LAB — LIPASE, BLOOD: Lipase: 35 U/L (ref 11–51)

## 2019-08-09 LAB — CBC WITH DIFFERENTIAL/PLATELET
Abs Immature Granulocytes: 0.03 10*3/uL (ref 0.00–0.07)
Basophils Absolute: 0 10*3/uL (ref 0.0–0.1)
Basophils Relative: 0 %
Eosinophils Absolute: 0.1 10*3/uL (ref 0.0–0.5)
Eosinophils Relative: 2 %
HCT: 44.5 % (ref 36.0–46.0)
Hemoglobin: 14.3 g/dL (ref 12.0–15.0)
Immature Granulocytes: 0 %
Lymphocytes Relative: 30 %
Lymphs Abs: 2.7 10*3/uL (ref 0.7–4.0)
MCH: 28.5 pg (ref 26.0–34.0)
MCHC: 32.1 g/dL (ref 30.0–36.0)
MCV: 88.6 fL (ref 80.0–100.0)
Monocytes Absolute: 0.6 10*3/uL (ref 0.1–1.0)
Monocytes Relative: 6 %
Neutro Abs: 5.5 10*3/uL (ref 1.7–7.7)
Neutrophils Relative %: 62 %
Platelets: 334 10*3/uL (ref 150–400)
RBC: 5.02 MIL/uL (ref 3.87–5.11)
RDW: 13.1 % (ref 11.5–15.5)
WBC: 9 10*3/uL (ref 4.0–10.5)
nRBC: 0 % (ref 0.0–0.2)

## 2019-08-09 LAB — URINALYSIS, ROUTINE W REFLEX MICROSCOPIC
Bilirubin Urine: NEGATIVE
Glucose, UA: NEGATIVE mg/dL
Hgb urine dipstick: NEGATIVE
Ketones, ur: NEGATIVE mg/dL
Leukocytes,Ua: NEGATIVE
Nitrite: NEGATIVE
Protein, ur: NEGATIVE mg/dL
Specific Gravity, Urine: >1.03 — ABNORMAL HIGH (ref 1.005–1.030)
pH: 6 (ref 5.0–8.0)

## 2019-08-09 MED ORDER — FAMOTIDINE IN NACL 20-0.9 MG/50ML-% IV SOLN
20.0000 mg | Freq: Once | INTRAVENOUS | Status: AC
  Administered 2019-08-09: 20 mg via INTRAVENOUS
  Filled 2019-08-09: qty 50

## 2019-08-09 MED ORDER — KETOROLAC TROMETHAMINE 30 MG/ML IJ SOLN
15.0000 mg | Freq: Once | INTRAMUSCULAR | Status: AC
  Administered 2019-08-09: 15 mg via INTRAVENOUS
  Filled 2019-08-09: qty 1

## 2019-08-09 MED ORDER — MORPHINE SULFATE (PF) 4 MG/ML IV SOLN
4.0000 mg | Freq: Once | INTRAVENOUS | Status: AC
  Administered 2019-08-09: 4 mg via INTRAVENOUS
  Filled 2019-08-09: qty 1

## 2019-08-09 MED ORDER — IOHEXOL 300 MG/ML  SOLN
100.0000 mL | Freq: Once | INTRAMUSCULAR | Status: AC | PRN
  Administered 2019-08-09: 100 mL via INTRAVENOUS

## 2019-08-09 MED ORDER — ALUM & MAG HYDROXIDE-SIMETH 200-200-20 MG/5ML PO SUSP
30.0000 mL | Freq: Once | ORAL | Status: AC
  Administered 2019-08-09: 30 mL via ORAL
  Filled 2019-08-09: qty 30

## 2019-08-09 MED ORDER — LIDOCAINE VISCOUS HCL 2 % MT SOLN
15.0000 mL | Freq: Once | OROMUCOSAL | Status: AC
  Administered 2019-08-09: 15 mL via ORAL
  Filled 2019-08-09: qty 15

## 2019-08-09 MED ORDER — FENTANYL CITRATE (PF) 100 MCG/2ML IJ SOLN
50.0000 ug | Freq: Once | INTRAMUSCULAR | Status: AC
  Administered 2019-08-09: 50 ug via INTRAVENOUS
  Filled 2019-08-09: qty 2

## 2019-08-09 NOTE — ED Notes (Signed)
PT back in room

## 2019-08-09 NOTE — ED Provider Notes (Signed)
Care assumed from [facility], please see her note for full details, but in brief [name] is a 49 y.o. female with presents with nausea and RUQ pain for 3 days. Focally tender in RUQ on exam. Blood work looks good, RUQ [place] with sludge but no evidence of cholecystitis. Will get CT as pt continues to have 8/10 pain.  BP 116/74 (BP Location: Left Arm)   Pulse 70   Temp 98.5 F (36.9 C) (Oral)   Resp 20   Ht 5' 2"  (1.575 m)   Wt 85.7 kg   LMP 02/19/2017 (Approximate)   SpO2 99%   BMI 34.57 kg/m    ED Course/Procedures     Labs Reviewed  URINALYSIS, ROUTINE W REFLEX MICROSCOPIC - Abnormal; Notable for the following components:      Result Value   Specific Gravity, Urine >1.030 (*)    All other components within normal limits  CBC WITH DIFFERENTIAL/PLATELET  COMPREHENSIVE METABOLIC PANEL  LIPASE, BLOOD   CT ABDOMEN PELVIS W CONTRAST  Result Date: 08/09/2019 CLINICAL DATA:  Abdomen pain EXAM: CT ABDOMEN AND PELVIS WITH CONTRAST TECHNIQUE: Multidetector CT imaging of the abdomen and pelvis was performed using the standard protocol following bolus administration of intravenous contrast. CONTRAST:  184m OMNIPAQUE IOHEXOL 300 MG/ML  SOLN COMPARISON:  Ultrasound 08/19/2019, CT 10/19/2018 FINDINGS: Lower chest: lung bases demonstrate no acute consolidation or effusion. The heart size is normal. Hepatobiliary: No focal liver abnormality is seen. No gallstones, gallbladder wall thickening, or biliary dilatation. Pancreas: Unremarkable. No pancreatic ductal dilatation or surrounding inflammatory changes. Spleen: Normal in size without focal abnormality. Adrenals/Urinary Tract: Adrenal glands are normal. No hydronephrosis. Multiple intrarenal stones bilaterally. Largest stones on the right are seen in the lower pole and measure up to 4 mm. Largest stones on the left or seen within the lower pole and measure up to 3 mm in size. No ureteral stones are visualized. The bladder is normal  Stomach/Bowel: Stomach is within normal limits. Appendix appears normal. No evidence of bowel wall thickening, distention, or inflammatory changes. Vascular/Lymphatic: No significant vascular findings are present. No enlarged abdominal or pelvic lymph nodes. Reproductive: Status post hysterectomy. No adnexal masses. Other: Negative for free air or free fluid Musculoskeletal: Left sacral stimulator. IMPRESSION: 1. No CT evidence for acute intra-abdominal or pelvic abnormality. 2. Multiple intrarenal stones bilaterally without evidence for obstructive uropathy. Electronically Signed   By: KDonavan FoilM.D.   On: 08/09/2019 18:08   [place] Limited RUQ  Result Date: 08/09/2019 CLINICAL DATA:  Right upper quadrant pain EXAM: ULTRASOUND ABDOMEN LIMITED RIGHT UPPER QUADRANT COMPARISON:  CT 10/19/2018 FINDINGS: Gallbladder: Small amount of gallbladder sludge.  No definitive shadowing stone. Common bile duct: Diameter: 5 mm Liver: No focal lesion identified. Within normal limits in parenchymal echogenicity. Portal vein is patent on color Doppler imaging with normal direction of blood flow towards the liver. Other: Slightly prominent pancreatic duct. IMPRESSION: 1. Small amount of gallbladder sludge. Negative for acute cholecystitis or biliary dilatation. Electronically Signed   By: KDonavan FoilM.D.   On: 08/09/2019 17:16    Procedures  MDM   CT scan with no evidence to explain patient's acute abdominal pain, there are multiple intrarenal stones noted bilaterally but there is no evidence of obstructive uropathy.  On reevaluation patient is still uncomfortable with pain localized to the RUQ, question whether the patient is having biliary colic from gallbladder sludge, will try up multifaceted approach for pain management with Pepcid, GI cocktail, Toradol  and fentanyl, but ultimately will plan for discharge home with outpatient general surgery and GI follow-up.  On reevaluation patient does report some  improvement in her pain.  Will discharge home with Zofran, Bentyl, encouraged her to use ibuprofen and Tylenol as well.  Discussed outpatient follow-up.  Patient expresses understanding and agreement with plan.  Discharged home in good condition.  Final diagnoses:  Pain  RUQ pain  Gallbladder sludge        Janet Berlin 08/09/19 1913    Wyvonnia Dusky, MD 08/09/19 2255

## 2019-08-09 NOTE — ED Notes (Signed)
ED Provider at bedside. 

## 2019-08-09 NOTE — ED Triage Notes (Signed)
Pt c/o right side abd pain x 3 days-nausea-denies v/d-seen/sent PCP-NAD-steady gait

## 2019-08-09 NOTE — ED Notes (Signed)
Pt to US.

## 2019-08-09 NOTE — ED Notes (Signed)
PT to CT scan. Pt appears to be resting comfortably in stretcher. No emesis since arrival.
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
## 2019-08-09 NOTE — ED Notes (Signed)
Pt. Is trying to give urine sample at this time

## 2019-08-09 NOTE — Discharge Instructions (Signed)
Your ultrasound shows that you have some sludge present in the gallbladder, this can cause pain and nausea.  Please read the information provided to make changes to your diet to help limit continued pain.  I would also like for you to follow-up with general surgery regarding the findings on your ultrasound today.  Your CT scan did not show any other potential explanation for your pain and your lab work looks good.  You can use prescribed nausea medicine at home as well as ibuprofen, Tylenol and prescribed bentyl.  Return for new or worsening abdominal pain, fevers, vomiting or other new or concerning symptoms.

## 2019-08-13 ENCOUNTER — Ambulatory Visit: Payer: Self-pay | Admitting: Surgery

## 2019-08-13 ENCOUNTER — Encounter: Payer: Self-pay | Admitting: Surgery

## 2019-08-13 DIAGNOSIS — N200 Calculus of kidney: Secondary | ICD-10-CM | POA: Insufficient documentation

## 2019-08-13 NOTE — H&P (View-Only) (Signed)
[name] Documented: 08/13/2019 9:45 AM Location: [facility] Patient #: [id number] DOB: 1970/07/30 Married / Language: English / Race: White Female  History of Present Illness [name] Hector MD; 08/13/2019 11:25 AM) The patient is a 49 year old female who presents with abdominal pain. Note for "Abdominal pain": ` ` ` Patient sent for surgical consultation at the request of  Chief Complaint: Right upper quadrant and epigastric abdominal pain with history of gallbladder sludge ` ` The patient is a woman with numerous medical problems. Interstitial cystitis followed by Dr. Amalia Hailey at [facility]. Irritable bowel syndrome with chronic constipation followed by Dr. Carlean Purl with [facility]. Fibromyalgia and chronic pain issues with chronic pain specialist in [place]. She has had a history of gastritis and irritation on proton pump inhibitors. Apparently she had a severe bout of nausea vomiting constipation and required a G-tube for tube feeds back in 2007. Medicines been removed. Had a gastric imaging study at that time that was normal. She does struggle with chronic pain and uses narcotics usually a few times a month.  She's had worsening episodes of abdominal pain. Especially right upper quadrant. Became much more severe last week. Went to the emergency room Friday night. CAT scan ruled out any major abnormalities. Ultrasound did show some blood sludge but no cholecystitis. Recommendation for surgical follow-up. She comes in 4 days later to our office. She notes she has achiness on her right chest wall and abdomen radiating to her back. Worse if she gets up and turns and twists. Worse when she eats She has not been able to tolerate solid food his she's had worsening nausea. Tolerating some liquids and yogurt. She moves her bowels once a week but that has been stable for her for a while. Uses some occasional wince as but not recently.  She normally can walk about 20 minutes without difficulty. Her urinary changes. No hematuria or hematochezia. No incontinence to flatus or stool.  (Review of systems as stated in this history (HPI) or in the review of systems. Otherwise all other 12 point ROS are negative) ` ` `  This patient encounter took 55 minutes today to perform the following: obtain history, perform exam, review outside records, interpret tests & imaging, counsel the patient on their diagnosis; and, document this encounter, including findings & plan in the electronic health record (EHR).   Past Surgical History (Chanel Teressa Senter, CMA; 08/13/2019 9:45 AM) Cesarean Section - 1 Foot Surgery Bilateral. Hysterectomy (not due to cancer) - Partial Knee Surgery Left. Oral Surgery Tonsillectomy  Diagnostic Studies History (Chanel Teressa Senter, CMA; 08/13/2019 9:45 AM) Colonoscopy >10 years ago Mammogram 1-3 years ago Pap Smear 1-5 years ago  Allergies Emmaline Kluver Teressa Senter, CMA; 08/13/2019 9:46 AM) Dulcolax *LAXATIVES* Allergies Reconciled  Medication History (Chanel Teressa Senter, CMA; 08/13/2019 9:47 AM) oxyCODONE-Acetaminophen (5-325MG Tablet, Oral) Active. Atorvastatin Calcium (40MG Tablet, Oral) Active. Lisinopril (10MG Tablet, Oral) Active. Tylenol Extra Strength (1000MG/30ML Liquid, Oral) Active. LORazepam (0.5MG Tablet, Oral) Active. Tamsulosin HCl (0.4MG Capsule, Oral) Active. Effexor XR (150MG Capsule ER 24HR, Oral) Active. Ondansetron HCl (4MG Tablet, Oral) Active. Lyrica (Oral) Specific strength unknown - Active. hydrOXYzine Pamoate (50MG Capsule, Oral) Active. Medications Reconciled  Social History Antonietta Jewel, CMA; 08/13/2019 9:45 AM) Alcohol use Occasional alcohol use. Caffeine use Carbonated beverages. No drug use Tobacco use Never smoker.  Family History Antonietta Jewel, [place]; 08/13/2019 9:45 AM) Arthritis Mother. Colon Polyps Mother. Depression Mother. Diabetes Mellitus  Father. Heart Disease Brother, Mother. Heart disease in female family member  before age 15 Heart disease in female family member before age 38 Hypertension Father, Mother.  Pregnancy / Birth History Antonietta Jewel, [place]; 08/13/2019 9:45 AM) Age at menarche 63 years. Contraceptive History Oral contraceptives. Gravida 1 Length (months) of breastfeeding 12-24 Maternal age 47-40 Para 2  Other Problems (Chanel Teressa Senter, CMA; 08/13/2019 9:45 AM) Anxiety Disorder Arthritis Back Pain Bladder Problems Depression Gastric Ulcer Gastroesophageal Reflux Disease General anesthesia - complications High blood pressure Kidney Stone Migraine Headache Other disease, cancer, significant illness Seizure Disorder     Review of Systems (Chanel Nolan CMA; 08/13/2019 9:45 AM) General Present- Appetite Loss, Fatigue and Weight Loss. Not Present- Chills, Fever, Night Sweats and Weight Gain. Skin Not Present- Change in Wart/Mole, Dryness, Hives, Jaundice, New Lesions, Non-Healing Wounds, Rash and Ulcer. HEENT Present- Wears glasses/contact lenses. Not Present- Earache, Hearing Loss, Hoarseness, Nose Bleed, Oral Ulcers, Ringing in the Ears, Seasonal Allergies, Sinus Pain, Sore Throat, Visual Disturbances and Yellow Eyes. Respiratory Not Present- Bloody sputum, Chronic Cough, Difficulty Breathing, Snoring and Wheezing. Breast Not Present- Breast Mass, Breast Pain, Nipple Discharge and Skin Changes. Cardiovascular Not Present- Chest Pain, Difficulty Breathing Lying Down, Leg Cramps, Palpitations, Rapid Heart Rate, Shortness of Breath and Swelling of Extremities. Gastrointestinal Present- Abdominal Pain, Indigestion and Nausea. Not Present- Bloating, Bloody Stool, Change in Bowel Habits, Chronic diarrhea, Constipation, Difficulty Swallowing, Excessive gas, Gets full quickly at meals, Hemorrhoids, Rectal Pain and Vomiting. Female Genitourinary Not Present- Frequency, Nocturia, Painful Urination,  Pelvic Pain and Urgency. Musculoskeletal Present- Back Pain. Not Present- Joint Pain, Joint Stiffness, Muscle Pain, Muscle Weakness and Swelling of Extremities. Neurological Not Present- Decreased Memory, Fainting, Headaches, Numbness, Seizures, Tingling, Tremor, Trouble walking and Weakness. Psychiatric Present- Depression. Not Present- Anxiety, Bipolar, Change in Sleep Pattern, Fearful and Frequent crying. Endocrine Not Present- Cold Intolerance, Excessive Hunger, Hair Changes, Heat Intolerance, Hot flashes and New Diabetes. Hematology Not Present- Blood Thinners, Easy Bruising, Excessive bleeding, Gland problems, HIV and Persistent Infections.  Vitals (Chanel Nolan CMA; 08/13/2019 9:48 AM) 08/13/2019 9:47 AM Weight: 194.13 lb Height: 63in Body Surface Area: 1.91 m Body Mass Index: 34.39 kg/m  Temp.: 98.59F  Pulse: 97 (Regular)  BP: 116/80 (Sitting, Left Arm, Standard)        Physical Exam [name] Hector MD; 08/13/2019 11:27 AM)  General Mental Status-Alert. General Appearance-Not in acute distress, Not Sickly. Orientation-Oriented X3. Hydration-Well hydrated. Voice-Normal. Note: Lying still. Tearful and uncomfortable. Anxious but somewhat consolable. Tired but not toxic  Integumentary Global Assessment Upon inspection and palpation of skin surfaces of the - Axillae: non-tender, no inflammation or ulceration, no drainage. and Distribution of scalp and body hair is normal. General Characteristics Temperature - normal warmth is noted.  Head and Neck Head-normocephalic, atraumatic with no lesions or palpable masses. Face Global Assessment - atraumatic, no absence of expression. Neck Global Assessment - no abnormal movements, no bruit auscultated on the right, no bruit auscultated on the left, no decreased range of motion, non-tender. Trachea-midline. Thyroid Gland Characteristics - non-tender.  Eye Eyeball - Left-Extraocular movements  intact, No Nystagmus - Left. Eyeball - Right-Extraocular movements intact, No Nystagmus - Right. Cornea - Left-No Hazy - Left. Cornea - Right-No Hazy - Right. Sclera/Conjunctiva - Left-No scleral icterus, No Discharge - Left. Sclera/Conjunctiva - Right-No scleral icterus, No Discharge - Right. Pupil - Left-Direct reaction to light normal. Pupil - Right-Direct reaction to light normal.  ENMT Ears Pinna - Left - no drainage observed, no generalized tenderness observed. Pinna - Right - no drainage observed, no generalized tenderness  observed. Nose and Sinuses External Inspection of the Nose - no destructive lesion observed. Inspection of the nares - Left - quiet respiration. Inspection of the nares - Right - quiet respiration. Mouth and Throat Lips - Upper Lip - no fissures observed, no pallor noted. Lower Lip - no fissures observed, no pallor noted. Nasopharynx - no discharge present. Oral Cavity/Oropharynx - Tongue - no dryness observed. Oral Mucosa - no cyanosis observed. Hypopharynx - no evidence of airway distress observed.  Chest and Lung Exam Inspection Movements - Normal and Symmetrical. Accessory muscles - No use of accessory muscles in breathing. Palpation Palpation of the chest reveals - Non-tender. Auscultation Breath sounds - Normal and Clear. Note: Obvious chest wall tenderness to palpation along anterior and lateral rib cage. Reproduction of pain with sternal compression. Left side not involved mild splinting with deep breaths but no internal pleuritic pain. No wheezes rales or rhonchi on auscultation. No sternal click  Cardiovascular Auscultation Rhythm - Regular. Murmurs & Other Heart Sounds - Auscultation of the heart reveals - No Murmurs and No Systolic Clicks.  Abdomen Inspection Inspection of the abdomen reveals - No Visible peristalsis and No Abnormal pulsations. Umbilicus - No Bleeding, No Urine drainage. Palpation/Percussion Palpation and  Percussion of the abdomen reveal - Soft, Non Tender, No Rebound tenderness, No Rigidity (guarding) and No Cutaneous hyperesthesia. Note: Abdomen soft. Some right upper quadrant & epigastric discomfort. When talking to her and she is distracted, her pain does not seem to be that bad as opposed to when she's focusing on it. No true Murphy sign. Left upper quadrant periumbilical and lower abdomen nontender Not severely distended. No distasis recti. No umbilical or other anterior abdominal wall hernias  Female Genitourinary Sexual Maturity Tanner 5 - Adult hair pattern. Note: No vaginal bleeding nor discharge  Peripheral Vascular Upper Extremity Inspection - Left - No Cyanotic nailbeds - Left, Not Ischemic. Inspection - Right - No Cyanotic nailbeds - Right, Not Ischemic.  Neurologic Neurologic evaluation reveals -normal attention span and ability to concentrate, able to name objects and repeat phrases. Appropriate fund of knowledge , normal sensation and normal coordination. Mental Status Affect - not angry, not paranoid. Cranial Nerves-Normal Bilaterally. Gait-Normal.  Neuropsychiatric Mental status exam performed with findings of-able to articulate well with normal speech/language, rate, volume and coherence, thought content normal with ability to perform basic computations and apply abstract reasoning and no evidence of hallucinations, delusions, obsessions or homicidal/suicidal ideation.  Musculoskeletal Global Assessment Spine, Ribs and Pelvis - no instability, subluxation or laxity. Right Upper Extremity - no instability, subluxation or laxity.  Lymphatic Head & Neck  General Head & Neck Lymphatics: Bilateral - Description - No Localized lymphadenopathy. Axillary  General Axillary Region: Bilateral - Description - No Localized lymphadenopathy. Femoral & Inguinal  Generalized Femoral & Inguinal Lymphatics: Left - Description - No Localized lymphadenopathy. Right -  Description - No Localized lymphadenopathy.    Assessment & Plan [name] Hector MD; 08/13/2019 11:31 AM)  COSTOCHONDRAL PAIN (R07.89) Impression: Chest wall pain reproduced with twisting and turning and coughing. Suspect she has some component of costochondritis most likely related to intermittent nausea vomiting and her chronic pain issues.  Heat and anti-inflammatories. Ideally would do a nonsteroidal but with her history of gastritis perhaps use Tylenol  Current Plans Pt Education - [facility] Pain control - tylenol only: discussed with patient and provided information.  CHRONIC CHOLECYSTITIS WITH CALCULUS (K80.10) Impression: While she has some costochondritis, I do think she has biliary colic as well.  I suspect she has more of a chronic cholecystitis and not necessarily acute. However with her constant soreness and dwindling oral intake, I do not think she can wait a few months. We'll try and see if we can originally set up laparoscopic cholecystectomy.. Initial do a liver biopsy as well. Cholangiogram. At approach given her obesity and history of prior gastrostomy tube, may need to be more of a 4 port standard operation. We will see.  I do not think she has any major operative risks from a cardiopulmonary standpoint. Apparently she had some pulmonary hypertension when she was pregnant and almost a decade ago. Follow-up echocardiogram normalized after her delivery. She has decent exercise tolerance.  Her biggest risk is her chronic pain and bowel issues make recovery asymptomatic unlikely. However hopefully we can get her to a better place. I am skeptical that having her revisit cardiology or gastroenterology will have much at this time. She wishes to proceed with surgery first  Current Plans Started Ondansetron 4 MG Oral Tablet Disintegrating, 1 (one) Tablet four times daily, as needed, #8, 08/13/2019, Ref. x2. You are being scheduled for surgery- Our schedulers will call you.  You should  hear from our office's scheduling department within 5 working days about the location, date, and time of surgery. We try to make accommodations for patient's preferences in scheduling surgery, but sometimes the OR schedule or the surgeon's schedule prevents [place] from making those accommodations.  If you have not heard from our office [phone]) in 5 working days, call the office and ask for your surgeon's nurse.  If you have other questions about your diagnosis, plan, or surgery, call the office and ask for your surgeon's nurse.  Written instructions provided Pt Education - Pamphlet Given - Laparoscopic Gallbladder Surgery: discussed with patient and provided information. The anatomy & physiology of hepatobiliary & pancreatic function was discussed. The pathophysiology of gallbladder dysfunction was discussed. Natural history risks without surgery was discussed. I feel the risks of no intervention will lead to serious problems that outweigh the operative risks; therefore, I recommended cholecystectomy to remove the pathology. I explained laparoscopic techniques with possible need for an open approach. Probable cholangiogram to evaluate the bilary tract was explained as well.  Risks such as bleeding, infection, abscess, leak, injury to other organs, need for further treatment, heart attack, death, and other risks were discussed. I noted a good likelihood this will help address the problem. Possibility that this will not correct all abdominal symptoms was explained. Goals of post-operative recovery were discussed as well. We will work to minimize complications. An educational handout further explaining the pathology and treatment options was given as well. Questions were answered. The patient expresses understanding & wishes to proceed with surgery.  Pt Education - [facility] Laparosopic Post Op HCI (Taydon Nasworthy) Pt Education - Laparoscopic Cholecystectomy: gallbladder  IRRITABLE BOWEL SYNDROME WITH  CONSTIPATION (K58.1) Impression: I noted that chronic constipation can exacerbate any abdominal issues. I strongly recommend that she get on a more aggressive bowel regimen. She's been able to have a colonoscopy bowel prep, so MiraLAX should help. 2-4 doses a day. He is a lens is more often. Hopefully if her bowels are moving more regularly, that will help with her abdominal pain and other issues  Current Plans Pt Education - [facility] Good Bowel Health (Treylin Burtch) Pt Education - [facility] IBS patient info: discussed with patient and provided information.  FIBROMYALGIA (M79.7)   INTERSTITIAL CYSTITIS (N30.10)  [name] Hector, MD, FACS, MASCRS [facility] [address], [place], [place] [address] [phone] Main / Paging [phone] Fax

## 2019-08-13 NOTE — H&P (Signed)
[name] Documented: 08/13/2019 9:45 AM Location: [facility] Patient #: [id number] DOB: 04-26-1971 Married / Language: English / Race: White Female  History of Present Illness [name] Hector MD; 08/13/2019 11:25 AM) The patient is a 49 year old female who presents with abdominal pain. Note for "Abdominal pain": ` ` ` Patient sent for surgical consultation at the request of  Chief Complaint: Right upper quadrant and epigastric abdominal pain with history of gallbladder sludge ` ` The patient is a woman with numerous medical problems. Interstitial cystitis followed by Dr. Amalia [name] at [facility]. Irritable bowel syndrome with chronic constipation followed by Dr. Carlean [name] with [name]. Fibromyalgia and chronic pain issues with chronic pain specialist in [place]. She has had a history of gastritis and irritation on proton pump inhibitors. Apparently she had a severe bout of nausea vomiting constipation and required a G-tube for tube feeds back in 2007. Medicines been removed. Had a gastric imaging study at that time that was normal. She does struggle with chronic pain and uses narcotics usually a few times a month.  She's had worsening episodes of abdominal pain. Especially right upper quadrant. Became much more severe last week. Went to the emergency room Friday night. CAT scan ruled out any major abnormalities. Ultrasound did show some blood sludge but no cholecystitis. Recommendation for surgical follow-up. She comes in 4 days later to our office. She notes she has achiness on her right chest wall and abdomen radiating to her back. Worse if she gets up and turns and twists. Worse when she eats She has not been able to tolerate solid food his she's had worsening nausea. Tolerating some liquids and yogurt. She moves her bowels once a week but that has been stable for her for a while. Uses some occasional wince as but not recently.  She normally can walk about 20 minutes without difficulty. Her urinary changes. No hematuria or hematochezia. No incontinence to flatus or stool.  (Review of systems as stated in this history (HPI) or in the review of systems. Otherwise all other 12 point ROS are negative) ` ` `  This patient encounter took 55 minutes today to perform the following: obtain history, perform exam, review outside records, interpret tests & imaging, counsel the patient on their diagnosis; and, document this encounter, including findings & plan in the electronic health record (EHR).   Past Surgical History ([name], CMA; 08/13/2019 9:45 AM) Cesarean Section - 1 Foot Surgery Bilateral. Hysterectomy (not due to cancer) - Partial Knee Surgery Left. Oral Surgery Tonsillectomy  Diagnostic Studies History ([name], CMA; 08/13/2019 9:45 AM) Colonoscopy >10 years ago Mammogram 1-3 years ago Pap Smear 1-5 years ago  Allergies Emmaline Kluver Teressa [name], CMA; 08/13/2019 9:46 AM) Dulcolax *LAXATIVES* Allergies Reconciled  Medication History ([name], CMA; 08/13/2019 9:47 AM) oxyCODONE-Acetaminophen (5-325MG Tablet, Oral) Active. Atorvastatin Calcium (40MG Tablet, Oral) Active. Lisinopril (10MG Tablet, Oral) Active. Tylenol Extra Strength (1000MG/30ML Liquid, Oral) Active. LORazepam (0.5MG Tablet, Oral) Active. Tamsulosin HCl (0.4MG Capsule, Oral) Active. Effexor XR (150MG Capsule ER 24HR, Oral) Active. Ondansetron HCl (4MG Tablet, Oral) Active. Lyrica (Oral) Specific strength unknown - Active. hydrOXYzine Pamoate (50MG Capsule, Oral) Active. Medications Reconciled  Social History [name], CMA; 08/13/2019 9:45 AM) Alcohol use Occasional alcohol use. Caffeine use Carbonated beverages. No drug use Tobacco use Never smoker.  Family History [name], [place]; 08/13/2019 9:45 AM) Arthritis Mother. Colon Polyps Mother. Depression Mother. Diabetes Mellitus  Father. Heart Disease Brother, Mother. Heart disease in female family member  before age 31 Heart disease in female family member before age 61 Hypertension Father, Mother.  Pregnancy / Birth History [name], [place]; 08/13/2019 9:45 AM) Age at menarche 62 years. Contraceptive History Oral contraceptives. Gravida 1 Length (months) of breastfeeding 12-24 Maternal age 84-40 Para 2  Other Problems ([name], CMA; 08/13/2019 9:45 AM) Anxiety Disorder Arthritis Back Pain Bladder Problems Depression Gastric Ulcer Gastroesophageal Reflux Disease General anesthesia - complications High blood pressure Kidney Stone Migraine Headache Other disease, cancer, significant illness Seizure Disorder     Review of Systems ([name] CMA; 08/13/2019 9:45 AM) General Present- Appetite Loss, Fatigue and Weight Loss. Not Present- Chills, Fever, Night Sweats and Weight Gain. Skin Not Present- Change in Wart/Mole, Dryness, Hives, Jaundice, New Lesions, Non-Healing Wounds, Rash and Ulcer. HEENT Present- Wears glasses/contact lenses. Not Present- Earache, Hearing Loss, Hoarseness, Nose Bleed, Oral Ulcers, Ringing in the Ears, Seasonal Allergies, Sinus Pain, Sore Throat, Visual Disturbances and Yellow Eyes. Respiratory Not Present- Bloody sputum, Chronic Cough, Difficulty Breathing, Snoring and Wheezing. Breast Not Present- Breast Mass, Breast Pain, Nipple Discharge and Skin Changes. Cardiovascular Not Present- Chest Pain, Difficulty Breathing Lying Down, Leg Cramps, Palpitations, Rapid Heart Rate, Shortness of Breath and Swelling of Extremities. Gastrointestinal Present- Abdominal Pain, Indigestion and Nausea. Not Present- Bloating, Bloody Stool, Change in Bowel Habits, Chronic diarrhea, Constipation, Difficulty Swallowing, Excessive gas, Gets full quickly at meals, Hemorrhoids, Rectal Pain and Vomiting. Female Genitourinary Not Present- Frequency, Nocturia, Painful Urination,  Pelvic Pain and Urgency. Musculoskeletal Present- Back Pain. Not Present- Joint Pain, Joint Stiffness, Muscle Pain, Muscle Weakness and Swelling of Extremities. Neurological Not Present- Decreased Memory, Fainting, Headaches, Numbness, Seizures, Tingling, Tremor, Trouble walking and Weakness. Psychiatric Present- Depression. Not Present- Anxiety, Bipolar, Change in Sleep Pattern, Fearful and Frequent crying. Endocrine Not Present- Cold Intolerance, Excessive Hunger, Hair Changes, Heat Intolerance, Hot flashes and New Diabetes. Hematology Not Present- Blood Thinners, Easy Bruising, Excessive bleeding, Gland problems, HIV and Persistent Infections.  Vitals ([name] CMA; 08/13/2019 9:48 AM) 08/13/2019 9:47 AM Weight: 194.13 lb Height: 63in Body Surface Area: 1.91 m Body Mass Index: 34.39 kg/m  Temp.: 98.58F  Pulse: 97 (Regular)  BP: 116/80 (Sitting, Left Arm, Standard)        Physical Exam [name] Hector MD; 08/13/2019 11:27 AM)  General Mental Status-Alert. General Appearance-Not in acute distress, Not Sickly. Orientation-Oriented X3. Hydration-Well hydrated. Voice-Normal. Note: Lying still. Tearful and uncomfortable. Anxious but somewhat consolable. Tired but not toxic  Integumentary Global Assessment Upon inspection and palpation of skin surfaces of the - Axillae: non-tender, no inflammation or ulceration, no drainage. and Distribution of scalp and body hair is normal. General Characteristics Temperature - normal warmth is noted.  Head and Neck Head-normocephalic, atraumatic with no lesions or palpable masses. Face Global Assessment - atraumatic, no absence of expression. Neck Global Assessment - no abnormal movements, no bruit auscultated on the right, no bruit auscultated on the left, no decreased range of motion, non-tender. Trachea-midline. Thyroid Gland Characteristics - non-tender.  Eye Eyeball - Left-Extraocular movements  intact, No Nystagmus - Left. Eyeball - Right-Extraocular movements intact, No Nystagmus - Right. Cornea - Left-No Hazy - Left. Cornea - Right-No Hazy - Right. Sclera/Conjunctiva - Left-No scleral icterus, No Discharge - Left. Sclera/Conjunctiva - Right-No scleral icterus, No Discharge - Right. Pupil - Left-Direct reaction to light normal. Pupil - Right-Direct reaction to light normal.  ENMT Ears Pinna - Left - no drainage observed, no generalized tenderness observed. Pinna - Right - no drainage observed, no generalized tenderness  observed. Nose and Sinuses External Inspection of the Nose - no destructive lesion observed. Inspection of the nares - Left - quiet respiration. Inspection of the nares - Right - quiet respiration. Mouth and Throat Lips - Upper Lip - no fissures observed, no pallor noted. Lower Lip - no fissures observed, no pallor noted. Nasopharynx - no discharge present. Oral Cavity/Oropharynx - Tongue - no dryness observed. Oral Mucosa - no cyanosis observed. Hypopharynx - no evidence of airway distress observed.  Chest and Lung Exam Inspection Movements - Normal and Symmetrical. Accessory muscles - No use of accessory muscles in breathing. Palpation Palpation of the chest reveals - Non-tender. Auscultation Breath sounds - Normal and Clear. Note: Obvious chest wall tenderness to palpation along anterior and lateral rib cage. Reproduction of pain with sternal compression. Left side not involved mild splinting with deep breaths but no internal pleuritic pain. No wheezes rales or rhonchi on auscultation. No sternal click  Cardiovascular Auscultation Rhythm - Regular. Murmurs & Other Heart Sounds - Auscultation of the heart reveals - No Murmurs and No Systolic Clicks.  Abdomen Inspection Inspection of the abdomen reveals - No Visible peristalsis and No Abnormal pulsations. Umbilicus - No Bleeding, No Urine drainage. Palpation/Percussion Palpation and  Percussion of the abdomen reveal - Soft, Non Tender, No Rebound tenderness, No Rigidity (guarding) and No Cutaneous hyperesthesia. Note: Abdomen soft. Some right upper quadrant & epigastric discomfort. When talking to her and she is distracted, her pain does not seem to be that bad as opposed to when she's focusing on it. No true Murphy sign. Left upper quadrant periumbilical and lower abdomen nontender Not severely distended. No distasis recti. No umbilical or other anterior abdominal wall hernias  Female Genitourinary Sexual Maturity Tanner 5 - Adult hair pattern. Note: No vaginal bleeding nor discharge  Peripheral Vascular Upper Extremity Inspection - Left - No Cyanotic nailbeds - Left, Not Ischemic. Inspection - Right - No Cyanotic nailbeds - Right, Not Ischemic.  Neurologic Neurologic evaluation reveals -normal attention span and ability to concentrate, able to name objects and repeat phrases. Appropriate fund of knowledge , normal sensation and normal coordination. Mental Status Affect - not angry, not paranoid. Cranial Nerves-Normal Bilaterally. Gait-Normal.  Neuropsychiatric Mental status exam performed with findings of-able to articulate well with normal speech/language, rate, volume and coherence, thought content normal with ability to perform basic computations and apply abstract reasoning and no evidence of hallucinations, delusions, obsessions or homicidal/suicidal ideation.  Musculoskeletal Global Assessment Spine, Ribs and Pelvis - no instability, subluxation or laxity. Right Upper Extremity - no instability, subluxation or laxity.  Lymphatic Head & Neck  General Head & Neck Lymphatics: Bilateral - Description - No Localized lymphadenopathy. Axillary  General Axillary Region: Bilateral - Description - No Localized lymphadenopathy. Femoral & Inguinal  Generalized Femoral & Inguinal Lymphatics: Left - Description - No Localized lymphadenopathy. Right -  Description - No Localized lymphadenopathy.    Assessment & Plan [name] Hector MD; 08/13/2019 11:31 AM)  COSTOCHONDRAL PAIN (R07.89) Impression: Chest wall pain reproduced with twisting and turning and coughing. Suspect she has some component of costochondritis most likely related to intermittent nausea vomiting and her chronic pain issues.  Heat and anti-inflammatories. Ideally would do a nonsteroidal but with her history of gastritis perhaps use Tylenol  Current Plans Pt Education - [facility] Pain control - tylenol only: discussed with patient and provided information.  CHRONIC CHOLECYSTITIS WITH CALCULUS (K80.10) Impression: While she has some costochondritis, I do think she has biliary colic as well.  I suspect she has more of a chronic cholecystitis and not necessarily acute. However with her constant soreness and dwindling oral intake, I do not think she can wait a few months. We'll try and see if we can originally set up laparoscopic cholecystectomy.. Initial do a liver biopsy as well. Cholangiogram. At approach given her obesity and history of prior gastrostomy tube, may need to be more of a 4 port standard operation. We will see.  I do not think she has any major operative risks from a cardiopulmonary standpoint. Apparently she had some pulmonary hypertension when she was pregnant and almost a decade ago. Follow-up echocardiogram normalized after her delivery. She has decent exercise tolerance.  Her biggest risk is her chronic pain and bowel issues make recovery asymptomatic unlikely. However hopefully we can get her to a better place. I am skeptical that having her revisit cardiology or gastroenterology will have much at this time. She wishes to proceed with surgery first  Current Plans Started Ondansetron 4 MG Oral Tablet Disintegrating, 1 (one) Tablet four times daily, as needed, #8, 08/13/2019, Ref. x2. You are being scheduled for surgery- Our schedulers will call you.  You should  hear from our office's scheduling department within 5 working days about the location, date, and time of surgery. We try to make accommodations for patient's preferences in scheduling surgery, but sometimes the OR schedule or the surgeon's schedule prevents [place] from making those accommodations.  If you have not heard from our office [phone]) in 5 working days, call the office and ask for your surgeon's nurse.  If you have other questions about your diagnosis, plan, or surgery, call the office and ask for your surgeon's nurse.  Written instructions provided Pt Education - Pamphlet Given - Laparoscopic Gallbladder Surgery: discussed with patient and provided information. The anatomy & physiology of hepatobiliary & pancreatic function was discussed. The pathophysiology of gallbladder dysfunction was discussed. Natural history risks without surgery was discussed. I feel the risks of no intervention will lead to serious problems that outweigh the operative risks; therefore, I recommended cholecystectomy to remove the pathology. I explained laparoscopic techniques with possible need for an open approach. Probable cholangiogram to evaluate the bilary tract was explained as well.  Risks such as bleeding, infection, abscess, leak, injury to other organs, need for further treatment, heart attack, death, and other risks were discussed. I noted a good likelihood this will help address the problem. Possibility that this will not correct all abdominal symptoms was explained. Goals of post-operative recovery were discussed as well. We will work to minimize complications. An educational handout further explaining the pathology and treatment options was given as well. Questions were answered. The patient expresses understanding & wishes to proceed with surgery.  Pt Education - [facility] Laparosopic Post Op HCI (Jacqualyn Sedgwick) Pt Education - Laparoscopic Cholecystectomy: gallbladder  IRRITABLE BOWEL SYNDROME WITH  CONSTIPATION (K58.1) Impression: I noted that chronic constipation can exacerbate any abdominal issues. I strongly recommend that she get on a more aggressive bowel regimen. She's been able to have a colonoscopy bowel prep, so MiraLAX should help. 2-4 doses a day. He is a lens is more often. Hopefully if her bowels are moving more regularly, that will help with her abdominal pain and other issues  Current Plans Pt Education - [facility] Good Bowel Health (Quantrell Splitt) Pt Education - [facility] IBS patient info: discussed with patient and provided information.  FIBROMYALGIA (M79.7)   INTERSTITIAL CYSTITIS (N30.10)  [name] Hector, MD, FACS, MASCRS [facility] [address] [place], [place] [address] [phone] Main / Paging [phone] Fax

## 2019-08-14 ENCOUNTER — Other Ambulatory Visit (HOSPITAL_COMMUNITY)
Admission: RE | Admit: 2019-08-14 | Discharge: 2019-08-14 | Disposition: A | Discharge status: Home / Self Care | Payer: 59 | Source: Ambulatory Visit | Attending: Surgery | Admitting: Surgery

## 2019-08-14 DIAGNOSIS — Z01812 Encounter for preprocedural laboratory examination: Secondary | ICD-10-CM | POA: Insufficient documentation

## 2019-08-14 DIAGNOSIS — Z20822 Contact with and (suspected) exposure to covid-19: Secondary | ICD-10-CM | POA: Insufficient documentation

## 2019-08-14 LAB — SARS CORONAVIRUS 2 (TAT 6-24 HRS): SARS Coronavirus 2: NEGATIVE

## 2019-08-15 ENCOUNTER — Encounter (HOSPITAL_COMMUNITY): Payer: Self-pay | Admitting: Surgery

## 2019-08-15 ENCOUNTER — Other Ambulatory Visit: Payer: Self-pay

## 2019-08-15 MED ORDER — BUPIVACAINE LIPOSOME 1.3 % IJ SUSP
20.0000 mL | Freq: Once | INTRAMUSCULAR | Status: DC
  Filled 2019-08-15 (×2): qty 20

## 2019-08-15 NOTE — Progress Notes (Signed)
Mrs. [name] denies chest pain or shortness of breath. Patient tested negative for Covid on 3/[id number] and has been in quarantine since that time. I instructed [name] that she is to not eat after midnight, But may drink clear liquid until 0900, patient states she drinks [name] and water, I told her that both are approved.

## 2019-08-16 ENCOUNTER — Other Ambulatory Visit: Payer: Self-pay

## 2019-08-16 ENCOUNTER — Ambulatory Visit (HOSPITAL_COMMUNITY): Payer: 59

## 2019-08-16 ENCOUNTER — Ambulatory Visit (HOSPITAL_COMMUNITY)
Admission: RE | Admit: 2019-08-16 | Discharge: 2019-08-16 | Disposition: A | Discharge status: Home / Self Care | Payer: 59 | Attending: Surgery | Admitting: Surgery

## 2019-08-16 ENCOUNTER — Encounter (HOSPITAL_COMMUNITY): Payer: Self-pay | Admitting: Surgery

## 2019-08-16 ENCOUNTER — Ambulatory Visit (HOSPITAL_COMMUNITY): Payer: 59 | Admitting: Certified Registered"

## 2019-08-16 ENCOUNTER — Encounter (HOSPITAL_COMMUNITY)
Admission: RE | Disposition: A | Discharge status: Home / Self Care | Payer: Self-pay | Source: Home / Self Care | Attending: Surgery

## 2019-08-16 DIAGNOSIS — K3184 Gastroparesis: Secondary | ICD-10-CM | POA: Insufficient documentation

## 2019-08-16 DIAGNOSIS — I10 Essential (primary) hypertension: Secondary | ICD-10-CM | POA: Diagnosis not present

## 2019-08-16 DIAGNOSIS — Z8719 Personal history of other diseases of the digestive system: Secondary | ICD-10-CM | POA: Diagnosis not present

## 2019-08-16 DIAGNOSIS — F419 Anxiety disorder, unspecified: Secondary | ICD-10-CM | POA: Diagnosis not present

## 2019-08-16 DIAGNOSIS — M940 Chondrocostal junction syndrome [Tietze]: Secondary | ICD-10-CM | POA: Diagnosis not present

## 2019-08-16 DIAGNOSIS — K811 Chronic cholecystitis: Secondary | ICD-10-CM | POA: Diagnosis present

## 2019-08-16 DIAGNOSIS — F329 Major depressive disorder, single episode, unspecified: Secondary | ICD-10-CM | POA: Insufficient documentation

## 2019-08-16 DIAGNOSIS — N3010 Interstitial cystitis (chronic) without hematuria: Secondary | ICD-10-CM | POA: Diagnosis not present

## 2019-08-16 DIAGNOSIS — K259 Gastric ulcer, unspecified as acute or chronic, without hemorrhage or perforation: Secondary | ICD-10-CM | POA: Diagnosis not present

## 2019-08-16 DIAGNOSIS — K581 Irritable bowel syndrome with constipation: Secondary | ICD-10-CM | POA: Diagnosis not present

## 2019-08-16 DIAGNOSIS — Z79899 Other long term (current) drug therapy: Secondary | ICD-10-CM | POA: Diagnosis not present

## 2019-08-16 DIAGNOSIS — G40909 Epilepsy, unspecified, not intractable, without status epilepticus: Secondary | ICD-10-CM | POA: Insufficient documentation

## 2019-08-16 DIAGNOSIS — Z888 Allergy status to other drugs, medicaments and biological substances status: Secondary | ICD-10-CM | POA: Insufficient documentation

## 2019-08-16 DIAGNOSIS — Z90710 Acquired absence of both cervix and uterus: Secondary | ICD-10-CM | POA: Diagnosis not present

## 2019-08-16 DIAGNOSIS — Z818 Family history of other mental and behavioral disorders: Secondary | ICD-10-CM | POA: Insufficient documentation

## 2019-08-16 DIAGNOSIS — M1990 Unspecified osteoarthritis, unspecified site: Secondary | ICD-10-CM | POA: Diagnosis not present

## 2019-08-16 DIAGNOSIS — K8010 Calculus of gallbladder with chronic cholecystitis without obstruction: Secondary | ICD-10-CM

## 2019-08-16 DIAGNOSIS — Z87891 Personal history of nicotine dependence: Secondary | ICD-10-CM | POA: Insufficient documentation

## 2019-08-16 DIAGNOSIS — K828 Other specified diseases of gallbladder: Secondary | ICD-10-CM | POA: Diagnosis not present

## 2019-08-16 DIAGNOSIS — G43909 Migraine, unspecified, not intractable, without status migrainosus: Secondary | ICD-10-CM | POA: Insufficient documentation

## 2019-08-16 DIAGNOSIS — K219 Gastro-esophageal reflux disease without esophagitis: Secondary | ICD-10-CM | POA: Insufficient documentation

## 2019-08-16 DIAGNOSIS — E669 Obesity, unspecified: Secondary | ICD-10-CM | POA: Insufficient documentation

## 2019-08-16 DIAGNOSIS — Z8249 Family history of ischemic heart disease and other diseases of the circulatory system: Secondary | ICD-10-CM | POA: Insufficient documentation

## 2019-08-16 DIAGNOSIS — Z6834 Body mass index (BMI) 34.0-34.9, adult: Secondary | ICD-10-CM | POA: Insufficient documentation

## 2019-08-16 DIAGNOSIS — Z87442 Personal history of urinary calculi: Secondary | ICD-10-CM | POA: Insufficient documentation

## 2019-08-16 DIAGNOSIS — Z419 Encounter for procedure for purposes other than remedying health state, unspecified: Secondary | ICD-10-CM

## 2019-08-16 DIAGNOSIS — M797 Fibromyalgia: Secondary | ICD-10-CM | POA: Diagnosis not present

## 2019-08-16 DIAGNOSIS — G8929 Other chronic pain: Secondary | ICD-10-CM | POA: Insufficient documentation

## 2019-08-16 DIAGNOSIS — Z8371 Family history of colonic polyps: Secondary | ICD-10-CM | POA: Insufficient documentation

## 2019-08-16 DIAGNOSIS — Z833 Family history of diabetes mellitus: Secondary | ICD-10-CM | POA: Insufficient documentation

## 2019-08-16 HISTORY — PX: LAPAROSCOPIC CHOLECYSTECTOMY SINGLE SITE WITH INTRAOPERATIVE CHOLANGIOGRAM: SHX6538

## 2019-08-16 SURGERY — LAPAROSCOPIC CHOLECYSTECTOMY SINGLE SITE WITH INTRAOPERATIVE CHOLANGIOGRAM
Anesthesia: General | Site: Abdomen

## 2019-08-16 MED ORDER — KETOROLAC TROMETHAMINE 30 MG/ML IJ SOLN
INTRAMUSCULAR | Status: AC
  Filled 2019-08-16: qty 1

## 2019-08-16 MED ORDER — ROCURONIUM BROMIDE 50 MG/5ML IV SOSY
PREFILLED_SYRINGE | INTRAVENOUS | Status: DC | PRN
  Administered 2019-08-16: 60 mg via INTRAVENOUS
  Administered 2019-08-16: 10 mg via INTRAVENOUS

## 2019-08-16 MED ORDER — BUPIVACAINE LIPOSOME 1.3 % IJ SUSP
INTRAMUSCULAR | Status: DC | PRN
  Administered 2019-08-16: 20 mL

## 2019-08-16 MED ORDER — CELECOXIB 200 MG PO CAPS
200.0000 mg | ORAL_CAPSULE | ORAL | Status: AC
  Administered 2019-08-16: 200 mg via ORAL
  Filled 2019-08-16: qty 1

## 2019-08-16 MED ORDER — MIDAZOLAM HCL 2 MG/2ML IJ SOLN
INTRAMUSCULAR | Status: AC
  Filled 2019-08-16: qty 2

## 2019-08-16 MED ORDER — SCOPOLAMINE 1 MG/3DAYS TD PT72
MEDICATED_PATCH | TRANSDERMAL | Status: AC
  Administered 2019-08-16: 1.5 mg via TRANSDERMAL
  Filled 2019-08-16: qty 1

## 2019-08-16 MED ORDER — DEXAMETHASONE SODIUM PHOSPHATE 10 MG/ML IJ SOLN
INTRAMUSCULAR | Status: DC | PRN
  Administered 2019-08-16: 5 mg via INTRAVENOUS

## 2019-08-16 MED ORDER — CLINDAMYCIN PHOSPHATE 900 MG/50ML IV SOLN
900.0000 mg | INTRAVENOUS | Status: AC
  Administered 2019-08-16: 900 mg via INTRAVENOUS
  Filled 2019-08-16: qty 50

## 2019-08-16 MED ORDER — OXYCODONE HCL 5 MG PO TABS: 5.0000 mg | ORAL_TABLET | Freq: Four times a day (QID) | ORAL | 0 refills | Status: DC | PRN

## 2019-08-16 MED ORDER — FENTANYL CITRATE (PF) 100 MCG/2ML IJ SOLN
INTRAMUSCULAR | Status: AC
  Administered 2019-08-16: 100 ug via INTRAVENOUS
  Filled 2019-08-16: qty 2

## 2019-08-16 MED ORDER — GABAPENTIN 300 MG PO CAPS: 300.0000 mg | ORAL_CAPSULE | ORAL | Status: AC

## 2019-08-16 MED ORDER — SODIUM CHLORIDE 0.9 % IV SOLN
INTRAVENOUS | Status: DC | PRN
  Administered 2019-08-16: 25 mL

## 2019-08-16 MED ORDER — ALBUMIN HUMAN 5 % IV SOLN: INTRAVENOUS | Status: DC | PRN

## 2019-08-16 MED ORDER — MIDAZOLAM HCL 2 MG/2ML IJ SOLN
INTRAMUSCULAR | Status: AC
  Administered 2019-08-16: 2 mg via INTRAVENOUS
  Filled 2019-08-16: qty 2

## 2019-08-16 MED ORDER — ONDANSETRON HCL 4 MG/2ML IJ SOLN
INTRAMUSCULAR | Status: AC
  Filled 2019-08-16: qty 2

## 2019-08-16 MED ORDER — ACETAMINOPHEN 500 MG PO TABS
1000.0000 mg | ORAL_TABLET | ORAL | Status: AC
  Administered 2019-08-16: 1000 mg via ORAL
  Filled 2019-08-16: qty 2

## 2019-08-16 MED ORDER — MIDAZOLAM HCL 2 MG/2ML IJ SOLN: 1.0000 mg | Freq: Once | INTRAMUSCULAR | Status: AC

## 2019-08-16 MED ORDER — LACTATED RINGERS IV SOLN: INTRAVENOUS | Status: DC | PRN

## 2019-08-16 MED ORDER — 0.9 % SODIUM CHLORIDE (POUR BTL) OPTIME
TOPICAL | Status: DC | PRN
  Administered 2019-08-16: 1000 mL

## 2019-08-16 MED ORDER — GABAPENTIN 300 MG PO CAPS
ORAL_CAPSULE | ORAL | Status: AC
  Administered 2019-08-16: 300 mg via ORAL
  Filled 2019-08-16: qty 1

## 2019-08-16 MED ORDER — ONDANSETRON HCL 4 MG/2ML IJ SOLN
4.0000 mg | Freq: Once | INTRAMUSCULAR | Status: AC | PRN
  Administered 2019-08-16: 4 mg via INTRAVENOUS

## 2019-08-16 MED ORDER — ONDANSETRON HCL 4 MG/2ML IJ SOLN
INTRAMUSCULAR | Status: DC | PRN
  Administered 2019-08-16: 4 mg via INTRAVENOUS

## 2019-08-16 MED ORDER — FENTANYL CITRATE (PF) 250 MCG/5ML IJ SOLN
INTRAMUSCULAR | Status: AC
  Filled 2019-08-16: qty 5

## 2019-08-16 MED ORDER — FENTANYL CITRATE (PF) 250 MCG/5ML IJ SOLN
INTRAMUSCULAR | Status: DC | PRN
  Administered 2019-08-16: 100 ug via INTRAVENOUS
  Administered 2019-08-16 (×3): 50 ug via INTRAVENOUS

## 2019-08-16 MED ORDER — BUPIVACAINE HCL (PF) 0.25 % IJ SOLN
INTRAMUSCULAR | Status: AC
  Filled 2019-08-16: qty 30

## 2019-08-16 MED ORDER — LIDOCAINE 2% (20 MG/ML) 5 ML SYRINGE
INTRAMUSCULAR | Status: DC | PRN
  Administered 2019-08-16: 80 mg via INTRAVENOUS

## 2019-08-16 MED ORDER — MIDAZOLAM HCL 5 MG/5ML IJ SOLN
INTRAMUSCULAR | Status: DC | PRN
  Administered 2019-08-16: 2 mg via INTRAVENOUS

## 2019-08-16 MED ORDER — KETOROLAC TROMETHAMINE 30 MG/ML IJ SOLN
30.0000 mg | Freq: Once | INTRAMUSCULAR | Status: AC | PRN
  Administered 2019-08-16: 30 mg via INTRAVENOUS

## 2019-08-16 MED ORDER — FENTANYL CITRATE (PF) 100 MCG/2ML IJ SOLN
INTRAMUSCULAR | Status: AC
  Administered 2019-08-16: 50 ug via INTRAVENOUS
  Filled 2019-08-16: qty 2

## 2019-08-16 MED ORDER — GENTAMICIN SULFATE 40 MG/ML IJ SOLN
5.0000 mg/kg | INTRAVENOUS | Status: AC
  Administered 2019-08-16: 430 mg via INTRAVENOUS
  Filled 2019-08-16: qty 10.75

## 2019-08-16 MED ORDER — ONDANSETRON 4 MG PO TBDP: 4.0000 mg | ORAL_TABLET | Freq: Three times a day (TID) | ORAL | 0 refills | Status: DC | PRN

## 2019-08-16 MED ORDER — BUPIVACAINE HCL (PF) 0.25 % IJ SOLN
INTRAMUSCULAR | Status: DC | PRN
  Administered 2019-08-16: 30 mL

## 2019-08-16 MED ORDER — HYDROMORPHONE HCL 1 MG/ML IJ SOLN: 0.2500 mg | INTRAMUSCULAR | Status: DC | PRN

## 2019-08-16 MED ORDER — EPHEDRINE SULFATE-NACL 50-0.9 MG/10ML-% IV SOSY
PREFILLED_SYRINGE | INTRAVENOUS | Status: DC | PRN
  Administered 2019-08-16: 5 mg via INTRAVENOUS

## 2019-08-16 MED ORDER — SCOPOLAMINE 1 MG/3DAYS TD PT72: 1.0000 | MEDICATED_PATCH | TRANSDERMAL | Status: DC

## 2019-08-16 MED ORDER — HYDROMORPHONE HCL 1 MG/ML IJ SOLN
INTRAMUSCULAR | Status: AC
  Filled 2019-08-16: qty 1

## 2019-08-16 MED ORDER — LACTATED RINGERS IV SOLN: INTRAVENOUS | Status: DC

## 2019-08-16 MED ORDER — MIDAZOLAM HCL 2 MG/2ML IJ SOLN: 2.0000 mg | Freq: Once | INTRAMUSCULAR | Status: AC

## 2019-08-16 MED ORDER — CHLORHEXIDINE GLUCONATE CLOTH 2 % EX PADS: 6.0000 | MEDICATED_PAD | Freq: Once | CUTANEOUS | Status: DC

## 2019-08-16 MED ORDER — FENTANYL CITRATE (PF) 100 MCG/2ML IJ SOLN: 50.0000 ug | Freq: Once | INTRAMUSCULAR | Status: AC

## 2019-08-16 MED ORDER — PROPOFOL 10 MG/ML IV BOLUS
INTRAVENOUS | Status: DC | PRN
  Administered 2019-08-16: 130 mg via INTRAVENOUS

## 2019-08-16 MED ORDER — FENTANYL CITRATE (PF) 100 MCG/2ML IJ SOLN: 100.0000 ug | Freq: Once | INTRAMUSCULAR | Status: AC

## 2019-08-16 MED ORDER — MIDAZOLAM HCL 2 MG/2ML IJ SOLN
INTRAMUSCULAR | Status: AC
  Administered 2019-08-16: 1 mg via INTRAVENOUS
  Filled 2019-08-16: qty 2

## 2019-08-16 MED ORDER — SODIUM CHLORIDE 0.9 % IR SOLN
Status: DC | PRN
  Administered 2019-08-16: 1000 mL

## 2019-08-16 MED ORDER — FENTANYL CITRATE (PF) 100 MCG/2ML IJ SOLN: 25.0000 ug | INTRAMUSCULAR | Status: DC | PRN

## 2019-08-16 MED ORDER — DEXAMETHASONE SODIUM PHOSPHATE 10 MG/ML IJ SOLN
4.0000 mg | INTRAMUSCULAR | Status: DC
  Filled 2019-08-16: qty 1

## 2019-08-16 MED ORDER — DEXMEDETOMIDINE HCL 200 MCG/2ML IV SOLN
INTRAVENOUS | Status: DC | PRN
  Administered 2019-08-16 (×2): 8 ug via INTRAVENOUS

## 2019-08-16 MED ORDER — STERILE WATER FOR IRRIGATION IR SOLN
Status: DC | PRN
  Administered 2019-08-16: 1000 mL

## 2019-08-16 MED ORDER — SUGAMMADEX SODIUM 200 MG/2ML IV SOLN
INTRAVENOUS | Status: DC | PRN
  Administered 2019-08-16: 200 mg via INTRAVENOUS

## 2019-08-16 MED ORDER — PHENYLEPHRINE 40 MCG/ML (10ML) SYRINGE FOR IV PUSH (FOR BLOOD PRESSURE SUPPORT)
PREFILLED_SYRINGE | INTRAVENOUS | Status: DC | PRN
  Administered 2019-08-16: 80 ug via INTRAVENOUS

## 2019-08-16 MED ORDER — PROPOFOL 10 MG/ML IV BOLUS
INTRAVENOUS | Status: AC
  Filled 2019-08-16: qty 20

## 2019-08-16 SURGICAL SUPPLY — 48 items
APL PRP STRL LF DISP 70% ISPRP (MISCELLANEOUS) ×2
APPLIER CLIP 5 13 M/L LIGAMAX5 (MISCELLANEOUS) ×3
APR CLP MED LRG 5 ANG JAW (MISCELLANEOUS) ×2
BAG SPEC RTRVL LRG 6X4 10 (ENDOMECHANICALS) ×2
CANISTER SUCT 3000ML PPV (MISCELLANEOUS) ×3 IMPLANT
CHLORAPREP W/TINT 26 (MISCELLANEOUS) ×3 IMPLANT
CLIP APPLIE 5 13 M/L LIGAMAX5 (MISCELLANEOUS) ×2 IMPLANT
COVER MAYO STAND STRL (DRAPES) ×3 IMPLANT
COVER SURGICAL LIGHT HANDLE (MISCELLANEOUS) ×3 IMPLANT
DRAPE C-ARM 42X72 X-RAY (DRAPES) ×3 IMPLANT
DRAPE WARM FLUID 44X44 (DRAPES) ×3 IMPLANT
DRSG TEGADERM 4X4.75 (GAUZE/BANDAGES/DRESSINGS) ×3 IMPLANT
ELECT REM PT RETURN 9FT ADLT (ELECTROSURGICAL) ×3
ELECTRODE REM PT RTRN 9FT ADLT (ELECTROSURGICAL) ×2 IMPLANT
ENDOLOOP SUT PDS II  0 18 (SUTURE) ×3
ENDOLOOP SUT PDS II 0 18 (SUTURE) IMPLANT
GAUZE SPONGE 2X2 8PLY STRL LF (GAUZE/BANDAGES/DRESSINGS) ×2 IMPLANT
GLOVE BIOGEL PI IND STRL 6.5 (GLOVE) IMPLANT
GLOVE BIOGEL PI INDICATOR 6.5 (GLOVE) ×2
GLOVE ECLIPSE 8.0 STRL XLNG CF (GLOVE) ×3 IMPLANT
GLOVE INDICATOR 8.0 STRL GRN (GLOVE) ×3 IMPLANT
GLOVE SURG SS PI 6.0 STRL IVOR (GLOVE) ×1 IMPLANT
GLOVE SURG SS PI 6.5 STRL IVOR (GLOVE) ×1 IMPLANT
GOWN STRL REUS W/ TWL LRG LVL3 (GOWN DISPOSABLE) ×4 IMPLANT
GOWN STRL REUS W/ TWL XL LVL3 (GOWN DISPOSABLE) ×2 IMPLANT
GOWN STRL REUS W/TWL LRG LVL3 (GOWN DISPOSABLE) ×12
GOWN STRL REUS W/TWL XL LVL3 (GOWN DISPOSABLE) ×3
KIT BASIN OR (CUSTOM PROCEDURE TRAY) ×3 IMPLANT
KIT TURNOVER KIT B (KITS) ×3 IMPLANT
NEEDLE 22X1 1/2 (OR ONLY) (NEEDLE) ×3 IMPLANT
NS IRRIG 1000ML POUR BTL (IV SOLUTION) ×3 IMPLANT
PAD ARMBOARD 7.5X6 YLW CONV (MISCELLANEOUS) ×6 IMPLANT
POUCH SPECIMEN RETRIEVAL 10MM (ENDOMECHANICALS) ×1 IMPLANT
SCISSORS LAP 5X35 DISP (ENDOMECHANICALS) ×3 IMPLANT
SET CHOLANGIOGRAPH 5 50 .035 (SET/KITS/TRAYS/PACK) ×3 IMPLANT
SET IRRIG TUBING LAPAROSCOPIC (IRRIGATION / IRRIGATOR) ×3 IMPLANT
SET TUBE SMOKE EVAC HIGH FLOW (TUBING) ×3 IMPLANT
SHEARS HARMONIC ACE PLUS 36CM (ENDOMECHANICALS) ×3 IMPLANT
SPONGE GAUZE 2X2 STER 10/PKG (GAUZE/BANDAGES/DRESSINGS) ×1
SUT MNCRL AB 4-0 PS2 18 (SUTURE) ×3 IMPLANT
SUT PDS AB 1 CT  36 (SUTURE) ×6
SUT PDS AB 1 CT 36 (SUTURE) ×4 IMPLANT
SUT VICRYL 0 TIES 12 18 (SUTURE) IMPLANT
TOWEL GREEN STERILE (TOWEL DISPOSABLE) ×3 IMPLANT
TRAY LAPAROSCOPIC MC (CUSTOM PROCEDURE TRAY) ×3 IMPLANT
TROCAR 5M 150ML BLDLS (TROCAR) ×3 IMPLANT
TROCAR XCEL NON-BLD 5MMX100MML (ENDOMECHANICALS) ×3 IMPLANT
WATER STERILE IRR 1000ML POUR (IV SOLUTION) ×3 IMPLANT

## 2019-08-16 NOTE — Anesthesia Preprocedure Evaluation (Addendum)
Anesthesia Evaluation  Patient identified by MRN, date of birth, ID band Patient awake    Reviewed: Allergy & Precautions, NPO status , Patient's Chart, lab work & pertinent test results  Airway Mallampati: II  TM Distance: >3 FB Neck ROM: Full    Dental  (+) Teeth Intact, Dental Advisory Given   Pulmonary former smoker,    breath sounds clear to auscultation       Cardiovascular hypertension,  Rhythm:Regular Rate:Normal     Neuro/Psych    GI/Hepatic   Endo/Other    Renal/GU      Musculoskeletal   Abdominal (+) + obese,   Peds  Hematology   Anesthesia Other Findings   Reproductive/Obstetrics                             Anesthesia Physical Anesthesia Plan  ASA: III  Anesthesia Plan: General   Post-op Pain Management:    Induction: Intravenous  PONV Risk Score and Plan: Ondansetron and Dexamethasone  Airway Management Planned: Oral ETT  Additional Equipment:   Intra-op Plan:   Post-operative Plan: Extubation in OR  Informed Consent: I have reviewed the patients History and Physical, chart, labs and discussed the procedure including the risks, benefits and alternatives for the proposed anesthesia with the patient or authorized representative who has indicated his/her understanding and acceptance.     Dental advisory given  Plan Discussed with: CRNA and Anesthesiologist  Anesthesia Plan Comments: (Chronic cholecystitis Chronic interstitial cystitis and chronic pain Hypertension   Plan GA with multimodal pain management. Consider bilateral TAP blocks.  Roberts Gaudy)       Anesthesia Quick Evaluation

## 2019-08-16 NOTE — Anesthesia Postprocedure Evaluation (Signed)
Anesthesia Post Note  Patient: [name]  Procedure(s) Performed: LAPAROSCOPIC CHOLECYSTECTOMY SINGLE SITE WITH INTRAOPERATIVE CHOLANGIOGRAM (N/A Abdomen)     Patient location during evaluation: PACU Anesthesia Type: General Level of consciousness: awake and alert Pain management: pain level controlled Vital Signs Assessment: post-procedure vital signs reviewed and stable Respiratory status: spontaneous breathing, nonlabored ventilation, respiratory function stable and patient connected to nasal cannula oxygen Cardiovascular status: blood pressure returned to baseline and stable Postop Assessment: no apparent nausea or vomiting Anesthetic complications: no    Last Vitals:  Vitals:   08/16/19 1445 08/16/19 1500  BP: 95/65 101/66  Pulse: 69 87  Resp: 18 15  Temp:  36.7 C  SpO2: 92% 93%    Last Pain:  Vitals:   08/16/19 1325  TempSrc:   PainSc: 6                  Brandey Vandalen COKER

## 2019-08-16 NOTE — OR Nursing (Signed)
Teal Exparel bracelet applied to patient's wrist

## 2019-08-16 NOTE — Anesthesia Procedure Notes (Signed)
Anesthesia Regional Block: TAP block   Pre-Anesthetic Checklist: ,, timeout performed, Correct Patient, Correct Site, Correct Laterality, Correct Procedure, Correct Position, site marked, Risks and benefits discussed, pre-op evaluation,  At surgeon's request and post-op pain management  Laterality: Right  Prep: Maximum Sterile Barrier Precautions used, chloraprep       Needles:  Injection technique: Single-shot  Needle Type: Echogenic Stimulator Needle     Needle Length: 9cm  Needle Gauge: 21     Additional Needles:   Narrative:  Start time: 08/16/2019 9:50 AM End time: 08/16/2019 10:00 AM Injection made incrementally with aspirations every 5 mL.  Performed by: Personally  Anesthesiologist: Roberts Gaudy, MD  Additional Notes: 30 cc 0.25% Bupivacaine 1:200 Epi injected easily

## 2019-08-16 NOTE — Transfer of Care (Signed)
Immediate Anesthesia Transfer of Care Note  Patient: [name]  Procedure(s) Performed: LAPAROSCOPIC CHOLECYSTECTOMY SINGLE SITE WITH INTRAOPERATIVE CHOLANGIOGRAM (N/A Abdomen)  Patient Location: PACU  Anesthesia Type:General  Level of Consciousness: oriented, drowsy and patient cooperative  Airway & Oxygen Therapy: Patient Spontanous Breathing and Patient connected to face mask oxygen  Post-op Assessment: Report given to RN and Post -op Vital signs reviewed and stable  Post vital signs: Reviewed  Last Vitals:  Vitals Value Taken Time  BP 100/44 08/16/19 1325  Temp 36.5 C 08/16/19 1325  Pulse 70 08/16/19 1336  Resp 15 08/16/19 1336  SpO2 100 % 08/16/19 1336  Vitals shown include unvalidated device data.  Last Pain:  Vitals:   08/16/19 1325  TempSrc:   PainSc: 6       Patients Stated Pain Goal: 4 (82/80/03 4917)  Complications: No apparent anesthesia complications

## 2019-08-16 NOTE — Op Note (Signed)
08/16/2019  PATIENT:  [name]  49 y.o. female  Patient Care Team: Virginia Rochester, [place] as PCP - General (Family Medicine) Gatha Mayer, MD as Consulting Physician (Gastroenterology) Leighton Ruff, MD as Consulting Physician (Colon and Rectal Surgery) Domingo Pulse, MD as Consulting Physician (Urology) Cleon Gustin, MD as Consulting Physician (Pain Medicine) Michael Boston, MD as Consulting Physician (General Surgery)  PRE-OPERATIVE DIAGNOSIS:    Chronic Calculus cholecystitis  POST-OPERATIVE DIAGNOSIS:   Chronic Calculus cholecystitis  PROCEDURE:  SINGLE SITE Laparoscopic cholecystectomy with intraoperative cholangiogram  SURGEON:  [name], MD, FACS.  ASSISTANT: OR Staff   ANESTHESIA:    General with endotracheal intubation Local anesthetic as a field block  EBL:  (See Anesthesia Intraoperative Record) Total I/O In: 1410.8 [I.V.:1000; IV Piggyback:410.8] Out: -   Delay start of Pharmacological VTE agent (>24hrs) due to surgical blood loss or risk of bleeding:  no  DRAINS: None   SPECIMEN: Gallbladder    DISPOSITION OF SPECIMEN:  PATHOLOGY  COUNTS:  YES  PLAN OF CARE: Discharge to home after PACU  PATIENT DISPOSITION:  PACU - hemodynamically stable.  INDICATION: Woman struggling with numerous abdominal issues including irritable bowel and reflux/gastroparesis.  Episodes of upper abdominal pain right-sided with gallstones.  Story suspicious of biliary colic.  I offered cholecystectomy  The anatomy & physiology of hepatobiliary & pancreatic function was discussed.  The pathophysiology of gallbladder dysfunction was discussed.  Natural history risks without surgery was discussed.   I feel the risks of no intervention will lead to serious problems that outweigh the operative risks; therefore, I recommended cholecystectomy to remove the pathology.  I explained laparoscopic techniques with possible need for an open approach.  Probable  cholangiogram to evaluate the bilary tract was explained as well.    Risks such as bleeding, infection, abscess, leak, injury to other organs, need for further treatment, heart attack, death, and other risks were discussed.  I noted a good likelihood this will help address the problem.  Possibility that this will not correct all abdominal symptoms was explained.  Goals of post-operative recovery were discussed as well.  We will work to minimize complications.  An educational handout further explaining the pathology and treatment options was given as well.  Questions were answered.  The patient expresses understanding & wishes to proceed with surgery.  OR FINDINGS: Boggy gallbladder somewhat atonic with mild adhesions suspicious for chronic cholecystitis  Liver: normal  DESCRIPTION:   The patient was identified & brought in the operating room. The patient was positioned supine with arms tucked. SCDs were active during the entire case. The patient underwent general anesthesia without any difficulty.  The abdomen was prepped and draped in a sterile fashion. A Surgical Timeout confirmed our plan.  I made a transverse curvilinear incision through the superior umbilical fold.  I placed a 44m long port through the supraumbilical fascia using a modified Hassan cutdown technique with umbilical stalk fascial countertraction. I began carbon dioxide insufflation.  No change in end tidal CO2 measurement.   Camera inspection revealed no injury. There were no adhesions to the anterior abdominal wall supraumbilically.  I proceeded to continue with single site technique. I placed a #5 port in left upper aspect of the wound. I placed a 5 mm atraumatic grasper in the right inferior aspect of the wound.  I turned attention to the right upper quadrant.  The gallbladder fundus was elevated cephalad. I freed adhesions of mesocolon, greater omentum, and duodenal bulb to the  ventral surface of the gallbladder off carefully.  I  freed the peritoneal coverings between the gallbladder and the liver on the posteriolateral and anteriomedial walls. I alternated between Harmonic & blunt Maryland dissection to help get a good critical view of the cystic artery and cystic duct.  did further dissection to free 80% of the gallbladder off the liver bed to get a good critical view of the infundibulum and cystic duct. I dissected out the cystic artery; and, after getting a good 360 view, ligated the anterior & posterior branches of the cystic artery close on the infundibulum using the Harmonic ultrasonic dissection.  I skeletonized the cystic duct.  I placed a clip on the infundibulum. I did a partial cystic duct-otomy and ensured patency. I placed a 5 [place] cholangiocatheter through a puncture site at the right subcostal ridge of the abdominal wall and directed it into the cystic duct.  We ran a cholangiogram with dilute radio-opaque contrast and continuous fluoroscopy. Contrast flowed from a side branch consistent with cystic duct cannulization. Contrast flowed up the common hepatic duct into the right and left intrahepatic chains out to secondary radicals. Contrast flowed down the common bile duct easily across the normal ampulla into the duodenum.  This was consistent with a normal cholangiogram.  I removed the cholangiocatheter. I placed clips on the cystic duct x4.  I completed cystic duct transection. I freed the gallbladder from its remaining attachments to the liver. I ensured hemostasis on the gallbladder fossa of the liver and elsewhere.  Placed the gallbladder inside and Endo Catch bag.  I inspected the rest of the abdomen & detected no injury nor bleeding elsewhere.  I removed the gallbladder out the supraumbilical fascia. I closed the fascia transversely using #1 PDS interrupted stitches. I closed the skin using 4-0 monocryl stitch.  Sterile dressing was applied. The patient was extubated & arrived in the PACU in stable  condition.  I had discussed postoperative care with the patient in the holding area. I discussed operative findings, updated the patient's status, discussed probable steps to recovery, and gave postoperative recommendations to the patient's spouse,   Recommendations were made.  Questions were answered.  He expressed understanding & appreciation.  [name], M.D., F.A.C.S. Gastrointestinal and Minimally Invasive Surgery [facility] [address], [place], [place] [address] [phone] Main / Paging  08/16/2019 1:06 PM

## 2019-08-16 NOTE — Anesthesia Procedure Notes (Addendum)
Anesthesia Regional Block: TAP block   Pre-Anesthetic Checklist: ,, timeout performed, Correct Patient, Correct Site, Correct Laterality, Correct Procedure, Correct Position, site marked, Risks and benefits discussed, pre-op evaluation,  At surgeon's request and post-op pain management  Laterality: Left  Prep: Maximum Sterile Barrier Precautions used, chloraprep       Needles:  Injection technique: Single-shot  Needle Type: Echogenic Stimulator Needle     Needle Length: 9cm  Needle Gauge: 21     Additional Needles:   Narrative:  Start time: 08/16/2019 10:00 AM End time: 08/16/2019 10:10 AM Injection made incrementally with aspirations every 5 mL. Anesthesiologist: Roberts Gaudy, MD  Additional Notes: 30 cc 0.25% Bupivacaine 1:200 epi injected easily

## 2019-08-16 NOTE — Anesthesia Procedure Notes (Signed)
Procedure Name: Intubation Date/Time: 08/16/2019 11:51 AM Performed by: Griffin Dakin, CRNA Pre-anesthesia Checklist: Patient identified, Emergency Drugs available, Suction available and Patient being monitored Patient Re-evaluated:Patient Re-evaluated prior to induction Oxygen Delivery Method: Circle system utilized Preoxygenation: Pre-oxygenation with 100% oxygen Induction Type: IV induction Ventilation: Mask ventilation without difficulty Laryngoscope Size: Mac and 3 Grade View: Grade I Tube type: Oral Tube size: 7.0 mm Number of attempts: 1 Airway Equipment and Method: Stylet and Oral airway Placement Confirmation: ETT inserted through vocal cords under direct vision,  positive ETCO2 and breath sounds checked- equal and bilateral Secured at: 22 cm Tube secured with: Tape Dental Injury: Teeth and Oropharynx as per pre-operative assessment

## 2019-08-16 NOTE — Discharge Instructions (Signed)
LAPAROSCOPIC SURGERY: POST OP INSTRUCTIONS  ######################################################################  EAT Gradually transition to a high fiber diet with a fiber supplement over the next few weeks after discharge.  Start with a pureed / full liquid diet (see below)  WALK Walk an hour a day.  Control your pain to do that.    CONTROL PAIN Control pain so that you can walk, sleep, tolerate sneezing/coughing, go up/down stairs.  HAVE A BOWEL MOVEMENT DAILY Keep your bowels regular to avoid problems.  OK to try a laxative to override constipation.  OK to use an antidairrheal to slow down diarrhea.  Call if not better after 2 tries  CALL IF YOU HAVE PROBLEMS/CONCERNS Call if you are still struggling despite following these instructions. Call if you have concerns not answered by these instructions  ######################################################################    1. DIET: Follow a light bland diet & liquids the first 24 hours after arrival home, such as soup, liquids, starches, etc.  Be sure to drink plenty of fluids.  Quickly advance to a usual solid diet within a few days.  Avoid fast food or heavy meals as your are more likely to get nauseated or have irregular bowels.  A low-fat, high-fiber diet for the rest of your life is ideal.  2. Take your usually prescribed home medications unless otherwise directed.  3. PAIN CONTROL: a. Pain is best controlled by a usual combination of three different methods TOGETHER: i. Ice/Heat ii. Over the counter pain medication iii. Prescription pain medication b. Most patients will experience some swelling and bruising around the incisions.  Ice packs or heating pads (30-60 minutes up to 6 times a day) will help. Use ice for the first few days to help decrease swelling and bruising, then switch to heat to help relax tight/sore spots and speed recovery.  Some people prefer to use ice alone, heat alone, alternating between ice & heat.   Experiment to what works for you.  Swelling and bruising can take several weeks to resolve.   c. It is helpful to take an over-the-counter pain medication regularly for the first few weeks.  Choose one of the following that works best for you: i. Naproxen (Aleve, etc)  Two 246m tabs twice a day ii. Ibuprofen (Advil, etc) Three 2019mtabs four times a day (every meal & bedtime) iii. Acetaminophen (Tylenol, etc) 500-65088mour times a day (every meal & bedtime) d. A  prescription for pain medication (such as oxycodone, hydrocodone, tramadol, gabapentin, methocarbamol, etc) should be given to you upon discharge.  Take your pain medication as prescribed.  i. If you are having problems/concerns with the prescription medicine (does not control pain, nausea, vomiting, rash, itching, etc), please call us [phone] see if we need to switch you to a different pain medicine that will work better for you and/or control your side effect better. ii. If you need a refill on your pain medication, please give us [place] hour notice.  contact your pharmacy.  They will contact our office to request authorization. Prescriptions will not be filled after 5 pm or on week-ends  4. Avoid getting constipated.   a. Between the surgery and the pain medications, it is common to experience some constipation.   b. Increasing fluid intake and taking a fiber supplement (such as Metamucil, Citrucel, FiberCon, MiraLax, etc) 1-2 times a day regularly will usually help prevent this problem from occurring.   c. A mild laxative (prune juice, Milk of Magnesia, MiraLax, etc) should be taken according to  package directions if there are no bowel movements after 48 hours.   5. Watch out for diarrhea.   a. If you have many loose bowel movements, simplify your diet to bland foods & liquids for a few days.   b. Stop any stool softeners and decrease your fiber supplement.   c. Switching to mild anti-diarrheal medications (Kayopectate, Pepto  Bismol) can help.   d. If this worsens or does not improve, please call us.  6. Wash / shower every day.  You may shower over the dressings as they are waterproof.  Continue to shower over incision(s) after the dressing is off.  7. Remove your waterproof bandages 5 days after surgery.  You may leave the incision open to air.  You may replace a dressing/Band-Aid to cover the incision for comfort if you wish.   8. ACTIVITIES as tolerated:   a. You may resume regular (light) daily activities beginning the next day--such as daily self-care, walking, climbing stairs--gradually increasing activities as tolerated.  If you can walk 30 minutes without difficulty, it is safe to try more intense activity such as jogging, treadmill, bicycling, low-impact aerobics, swimming, etc. b. Save the most intensive and strenuous activity for last such as sit-ups, heavy lifting, contact sports, etc  Refrain from any heavy lifting or straining until you are off narcotics for pain control.   c. DO NOT PUSH THROUGH PAIN.  Let pain be your guide: If it hurts to do something, don't do it.  Pain is your body warning you to avoid that activity for another week until the pain goes down. d. You may drive when you are no longer taking prescription pain medication, you can comfortably wear a seatbelt, and you can safely maneuver your car and apply brakes. e. Dennis Bast may have sexual intercourse when it is comfortable.  9. FOLLOW UP in our office a. Please call [facility] at [phone] to set up an appointment to see your surgeon in the office for a follow-up appointment approximately 2-3 weeks after your surgery. b. Make sure that you call for this appointment the day you arrive home to insure a convenient appointment time.  10. IF YOU HAVE DISABILITY OR FAMILY LEAVE FORMS, BRING THEM TO THE OFFICE FOR PROCESSING.  DO NOT GIVE THEM TO YOUR DOCTOR.   WHEN TO CALL us [phone]: 1. Poor pain control 2. Reactions / problems with new  medications (rash/itching, nausea, etc)  3. Fever over 101.5 F (38.5 C) 4. Inability to urinate 5. Nausea and/or vomiting 6. Worsening swelling or bruising 7. Continued bleeding from incision. 8. Increased pain, redness, or drainage from the incision   The clinic staff is available to answer your questions during regular business hours (8:30am-5pm).  Please don't hesitate to call and ask to speak to one of our nurses for clinical concerns.   If you have a medical emergency, go to the nearest emergency room or call 911.  A surgeon from [facility] is always on call at the [facility], [place], [place], [place], [place]  [address] ? MAIN: [phone] ? TOLL FREE: [phone] ?  FAX [phone] [email or web address]   Cholecystitis  Cholecystitis is inflammation of the gallbladder. It is often called a gallbladder attack. The gallbladder is a pear-shaped organ that lies beneath the liver on the right side of the body. The gallbladder stores bile, which is a fluid that helps the body digest fats. If bile builds up  in your gallbladder, your gallbladder becomes inflamed. This condition may occur suddenly. Cholecystitis is a serious condition and requires treatment. What are the causes? The most common cause of this condition is gallstones. Gallstones can block the tube (duct) that carries bile out of your gallbladder. This causes bile to build up. Other causes include:  Damage to the gallbladder due to a decrease in blood flow.  Infections in the bile ducts.  Scars or kinks in the bile ducts.  Tumors in the liver, pancreas, or gallbladder. What increases the risk? You are more likely to develop this condition if:  You have sickle cell disease.  You take birth control pills or use estrogen.  You have alcoholic liver disease.  You have liver cirrhosis.  You have your nutrition delivered through a vein  (parenteral nutrition).  You are critically ill.  You do not eat or drink for a long time. This is also called "fasting."  You are obese.  You lose weight too fast.  You are pregnant.  You have high levels of fat (triglycerides) in the blood.  You have pancreatitis. What are the signs or symptoms? Symptoms of this condition include:  Pain in the abdomen, especially in the upper right area of the abdomen.  Tenderness or bloating in the abdomen.  Nausea.  Vomiting.  Fever.  Chills. How is this diagnosed? This condition is diagnosed with a medical history and physical exam. You may also have other tests, including:  Imaging tests, such as: ? An ultrasound of the gallbladder. ? A CT scan of the abdomen. ? A gallbladder nuclear scan (HIDA scan). This scan allows your health care provider to see the bile moving from your liver to your gallbladder and on to your small intestine. ? MRI.  Blood tests, such as: ? A complete blood count. The white blood cell count may be higher than normal. ? Liver function tests. Certain types of gallstones cause some results to be higher than normal. How is this treated? Treatment may include:  Surgery to remove your gallbladder (cholecystectomy).  Antibiotic medicine, usually through an IV.  Fasting for a certain amount of time.  Giving IV fluids.  Medicine to treat pain or vomiting. Follow these instructions at home:  If you had surgery, follow instructions from your health care provider about home care after the procedure. Medicines   Take over-the-counter and prescription medicines only as told by your health care provider.  If you were prescribed an antibiotic medicine, take it as told by your health care provider. Do not stop taking the antibiotic even if you start to feel better. General instructions  Follow instructions from your health care provider about what to eat or drink. When you are allowed to eat, avoid eating  or drinking anything that triggers your symptoms.  Do not lift anything that is heavier than 10 lb (4.5 kg), or the limit that you are told, until your health care provider says that it is safe.  Do not use any products that contain nicotine or tobacco, such as cigarettes and e-cigarettes. If you need help quitting, ask your health care provider.  Keep all follow-up visits as told by your health care provider. This is important. Contact a health care provider if:  Your pain is not controlled with medicine.  You have a fever. Get help right away if:  Your pain moves to another part of your abdomen or to your back.  You continue to have symptoms or you develop new symptoms  even with treatment. Summary  Cholecystitis is inflammation of the gallbladder.  The most common cause of this condition is gallstones. Gallstones can block the tube (duct) that carries bile out of your gallbladder.  Common symptoms are pain in the abdomen, nausea, vomiting, fever, and chills.  This condition is treated with surgery to remove the gallbladder, medicines, fasting, and IV fluids.  Follow your health care provider's instructions for eating and drinking. Avoid eating anything that triggers your symptoms. This information is not intended to replace advice given to you by your health care provider. Make sure you discuss any questions you have with your health care provider. Document Revised: 09/22/2017 Document Reviewed: 09/22/2017 Elsevier Patient Education  [place].

## 2019-08-16 NOTE — Interval H&P Note (Signed)
History and Physical Interval Note:  08/16/2019 8:42 AM  [name]  has presented today for surgery, with the diagnosis of SYMPTOMATIC BILIARY COLIC, PROBABLE CHRONIC CHOLECYSTITIS.  The various methods of treatment have been discussed with the patient and family. After consideration of risks, benefits and other options for treatment, the patient has consented to  Procedure(s): [place] CHOLANGIOGRAM (N/A) LIVER BIOPSY (N/A) as a surgical intervention.  The patient's history has been reviewed, patient examined, no change in status, stable for surgery.  I have reviewed the patient's chart and labs.  Questions were answered to the patient's satisfaction.     [name]

## 2019-08-19 LAB — SURGICAL PATHOLOGY

## 2019-08-19 NOTE — Addendum Note (Signed)
Addendum  created 08/19/19 1412 by Roberts Gaudy, MD   Clinical Note Signed, Intraprocedure Blocks edited

## 2019-08-20 ENCOUNTER — Other Ambulatory Visit: Payer: Self-pay | Admitting: Cardiovascular Disease

## 2019-09-14 ENCOUNTER — Other Ambulatory Visit: Payer: Self-pay | Admitting: Cardiovascular Disease

## 2019-09-20 ENCOUNTER — Ambulatory Visit: Payer: 59 | Admitting: Cardiovascular Disease

## 2019-10-08 ENCOUNTER — Telehealth: Payer: Self-pay | Admitting: Cardiovascular Disease

## 2019-10-08 NOTE — Telephone Encounter (Signed)
Called patient and advised her that she may keep her appointment on Thursday as long as she does not have new onset of symptoms of Covid-19 and/or has a fever. She verbalized understanding and thanked me for the call.

## 2019-10-08 NOTE — Telephone Encounter (Signed)
[name] is calling wanting to know if it's okay for her to attend her upcoming appointment scheduled for 10/10/19 due to her being diagnosed with bronchitis that is not contagious. She states if she is unable to answer when calling back I regards to this to please leave a detailed message. Please advise.

## 2019-10-09 ENCOUNTER — Other Ambulatory Visit: Payer: Self-pay | Admitting: Cardiovascular Disease

## 2019-10-09 MED ORDER — LISINOPRIL 10 MG PO TABS: 10.0000 mg | ORAL_TABLET | Freq: Every day | ORAL | 0 refills | Status: DC

## 2019-10-09 NOTE — Telephone Encounter (Signed)
New Message    *STAT* If patient is at the pharmacy, call can be transferred to refill team.   1. Which medications need to be refilled? (please list name of each medication and dose if known) lisinopril (ZESTRIL) 10 MG tablet   2. Which pharmacy/location (including street and city if local pharmacy) is medication to be sent to? [id number] [place], [place] - [address] 3. Do they need a 30 day or 90 day supply? [address]

## 2019-10-09 NOTE — Telephone Encounter (Signed)
Pt's medication was sent to pt's pharmacy as requested. Confirmation received.  °

## 2019-10-10 ENCOUNTER — Ambulatory Visit: Payer: 59 | Admitting: Cardiovascular Disease

## 2019-10-26 ENCOUNTER — Other Ambulatory Visit: Payer: Self-pay | Admitting: Cardiovascular Disease

## 2019-10-30 ENCOUNTER — Other Ambulatory Visit: Payer: Self-pay

## 2019-10-30 ENCOUNTER — Ambulatory Visit (INDEPENDENT_AMBULATORY_CARE_PROVIDER_SITE_OTHER): Payer: 59 | Admitting: Cardiovascular Disease

## 2019-10-30 ENCOUNTER — Encounter: Payer: Self-pay | Admitting: Cardiovascular Disease

## 2019-10-30 VITALS — BP 126/72 | HR 84 | Ht 62.0 in | Wt 189.5 lb

## 2019-10-30 DIAGNOSIS — E785 Hyperlipidemia, unspecified: Secondary | ICD-10-CM

## 2019-10-30 DIAGNOSIS — E7800 Pure hypercholesterolemia, unspecified: Secondary | ICD-10-CM | POA: Diagnosis not present

## 2019-10-30 DIAGNOSIS — I10 Essential (primary) hypertension: Secondary | ICD-10-CM

## 2019-10-30 LAB — HEPATIC FUNCTION PANEL
ALT: 44 IU/L — ABNORMAL HIGH (ref 0–32)
AST: 28 IU/L (ref 0–40)
Albumin: 4.4 g/dL (ref 3.8–4.8)
Alkaline Phosphatase: 113 IU/L (ref 48–121)
Bilirubin Total: 0.5 mg/dL (ref 0.0–1.2)
Bilirubin, Direct: 0.18 mg/dL (ref 0.00–0.40)
Total Protein: 6.7 g/dL (ref 6.0–8.5)

## 2019-10-30 LAB — BASIC METABOLIC PANEL
BUN/Creatinine Ratio: 21 (ref 9–23)
BUN: 14 mg/dL (ref 6–24)
CO2: 23 mmol/L (ref 20–29)
Calcium: 9.6 mg/dL (ref 8.7–10.2)
Chloride: 102 mmol/L (ref 96–106)
Creatinine, Ser: 0.68 mg/dL (ref 0.57–1.00)
GFR calc Af Amer: 121 mL/min/{1.73_m2} (ref >59)
GFR calc non Af Amer: 105 mL/min/{1.73_m2} (ref >59)
Glucose: 112 mg/dL — ABNORMAL HIGH (ref 65–99)
Potassium: 4.2 mmol/L (ref 3.5–5.2)
Sodium: 139 mmol/L (ref 134–144)

## 2019-10-30 LAB — LIPID PANEL
Chol/HDL Ratio: 2.9 ratio (ref 0.0–4.4)
Cholesterol, Total: 184 mg/dL (ref 100–199)
HDL: 64 mg/dL (ref >39)
LDL Chol Calc (NIH): 101 mg/dL — ABNORMAL HIGH (ref 0–99)
Triglycerides: 107 mg/dL (ref 0–149)
VLDL Cholesterol Cal: 19 mg/dL (ref 5–40)

## 2019-10-30 MED ORDER — ATORVASTATIN CALCIUM 40 MG PO TABS: 40.0000 mg | ORAL_TABLET | Freq: Every day | ORAL | 3 refills | Status: DC

## 2019-10-30 MED ORDER — LISINOPRIL 10 MG PO TABS: 10.0000 mg | ORAL_TABLET | Freq: Every day | ORAL | 3 refills | Status: DC
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
## 2019-10-30 NOTE — Patient Instructions (Addendum)
Your physician recommends that you continue on your current medications as directed. Please refer to the Current Medication list given to you today.  Your physician recommends that you return for lab work in:  [place] wants you to follow-up in: [place] will receive a reminder letter in the mail two months in advance. If you don't receive a letter, please call our office to schedule the follow-up appointment.

## 2020-04-27 ENCOUNTER — Other Ambulatory Visit: Payer: Self-pay

## 2020-04-27 ENCOUNTER — Encounter (HOSPITAL_BASED_OUTPATIENT_CLINIC_OR_DEPARTMENT_OTHER): Payer: Self-pay | Admitting: *Deleted

## 2020-04-27 ENCOUNTER — Emergency Department (HOSPITAL_BASED_OUTPATIENT_CLINIC_OR_DEPARTMENT_OTHER)
Admission: EM | Admit: 2020-04-27 | Discharge: 2020-04-28 | Disposition: A | Discharge status: Home / Self Care | Payer: 59 | Attending: Emergency Medicine | Admitting: Emergency Medicine

## 2020-04-27 DIAGNOSIS — M79609 Pain in unspecified limb: Secondary | ICD-10-CM

## 2020-04-27 DIAGNOSIS — X501XXA Overexertion from prolonged static or awkward postures, initial encounter: Secondary | ICD-10-CM | POA: Insufficient documentation

## 2020-04-27 DIAGNOSIS — Y9389 Activity, other specified: Secondary | ICD-10-CM | POA: Insufficient documentation

## 2020-04-27 DIAGNOSIS — I10 Essential (primary) hypertension: Secondary | ICD-10-CM | POA: Diagnosis not present

## 2020-04-27 DIAGNOSIS — M25561 Pain in right knee: Secondary | ICD-10-CM | POA: Insufficient documentation

## 2020-04-27 DIAGNOSIS — Z87891 Personal history of nicotine dependence: Secondary | ICD-10-CM | POA: Diagnosis not present

## 2020-04-27 DIAGNOSIS — Z79899 Other long term (current) drug therapy: Secondary | ICD-10-CM | POA: Diagnosis not present

## 2020-04-27 MED ORDER — NAPROXEN 250 MG PO TABS
500.0000 mg | ORAL_TABLET | Freq: Once | ORAL | Status: AC
  Administered 2020-04-28: 500 mg via ORAL
  Filled 2020-04-27: qty 2

## 2020-04-27 NOTE — ED Triage Notes (Signed)
Pain in her right lower leg sudden onset x 3 hours.
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
## 2020-04-28 DIAGNOSIS — M25561 Pain in right knee: Secondary | ICD-10-CM | POA: Diagnosis not present

## 2020-04-28 LAB — D-DIMER, QUANTITATIVE: D-Dimer, Quant: 0.3 ug/mL-FEU (ref 0.00–0.50)

## 2020-04-29 ENCOUNTER — Encounter: Payer: Self-pay | Admitting: Family Medicine

## 2020-04-29 ENCOUNTER — Ambulatory Visit (INDEPENDENT_AMBULATORY_CARE_PROVIDER_SITE_OTHER): Payer: 59 | Admitting: Family Medicine

## 2020-04-29 ENCOUNTER — Ambulatory Visit: Payer: Self-pay

## 2020-04-29 ENCOUNTER — Other Ambulatory Visit: Payer: Self-pay

## 2020-04-29 VITALS — BP 118/76 | HR 82 | Ht 63.0 in | Wt 190.0 lb

## 2020-04-29 DIAGNOSIS — M23351 Other meniscus derangements, posterior horn of lateral meniscus, right knee: Secondary | ICD-10-CM | POA: Diagnosis not present

## 2020-04-29 DIAGNOSIS — M25561 Pain in right knee: Secondary | ICD-10-CM

## 2020-04-29 MED ORDER — PREDNISONE 5 MG PO TABS: ORAL_TABLET | ORAL | 0 refills | Status: DC

## 2020-04-29 MED ORDER — AMBULATORY NON FORMULARY MEDICATION: 0 refills | Status: DC

## 2020-04-29 NOTE — Patient Instructions (Signed)
Nice to meet you Please try the prednisone  Please try ice  Please try the range of motion movements.   Please send me a message in MyChart with any questions or updates.  Please see me back in 2-3 weeks.   --Dr. Raeford Razor

## 2020-04-29 NOTE — Assessment & Plan Note (Signed)
Concern that she has changes of the posterior horn or possibly the posterior lateral corner given the location and changes appreciated on ultrasound.  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 -Counseled on home exercise therapy and supportive care. -Prednisone. -Rolling knee scooter. -Could consider physical therapy or injection.
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
## 2020-04-30 ENCOUNTER — Other Ambulatory Visit: Payer: Self-pay

## 2020-04-30 MED ORDER — LISINOPRIL 10 MG PO TABS: 10.0000 mg | ORAL_TABLET | Freq: Every day | ORAL | 1 refills | Status: DC

## 2020-04-30 MED ORDER — ATORVASTATIN CALCIUM 40 MG PO TABS
40.0000 mg | ORAL_TABLET | Freq: Every day | ORAL | 1 refills | Status: DC
Start: 2020-04-30 — End: 2020-12-03

## 2020-05-15 ENCOUNTER — Ambulatory Visit: Payer: 59 | Admitting: Family Medicine

## 2020-11-02 ENCOUNTER — Encounter: Payer: Self-pay | Admitting: Family Medicine

## 2020-11-02 ENCOUNTER — Ambulatory Visit: Payer: Self-pay

## 2020-11-02 ENCOUNTER — Ambulatory Visit (INDEPENDENT_AMBULATORY_CARE_PROVIDER_SITE_OTHER): Payer: BC Managed Care – PPO | Admitting: Family Medicine

## 2020-11-02 ENCOUNTER — Other Ambulatory Visit: Payer: Self-pay

## 2020-11-02 VITALS — BP 120/82 | Ht 63.0 in | Wt 185.0 lb

## 2020-11-02 DIAGNOSIS — M7712 Lateral epicondylitis, left elbow: Secondary | ICD-10-CM | POA: Diagnosis not present

## 2020-11-02 MED ORDER — METHYLPREDNISOLONE ACETATE 40 MG/ML IJ SUSP
40.0000 mg | Freq: Once | INTRAMUSCULAR | Status: AC
  Administered 2020-11-02: 40 mg via INTRA_ARTICULAR

## 2020-11-02 NOTE — Patient Instructions (Signed)
Good to see you Please try ice  Please try the exercises  Please send me a message in MyChart with any questions or updates.  Please see me back in 4 weeks.   --Dr. Raeford Razor
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
## 2020-11-02 NOTE — Addendum Note (Signed)
Addended by: Cresenciano Lick on: 11/02/2020 04:17 PM   Modules accepted: Orders

## 2020-11-02 NOTE — Assessment & Plan Note (Signed)
Clinical exam seems most consistent with lateral epicondylitis -Counseled on home exercise therapy and supportive care. -Injection today. -Could consider shockwave or physical therapy.

## 2020-11-18 DIAGNOSIS — Z6833 Body mass index (BMI) 33.0-33.9, adult: Secondary | ICD-10-CM | POA: Diagnosis not present

## 2020-11-18 DIAGNOSIS — M47816 Spondylosis without myelopathy or radiculopathy, lumbar region: Secondary | ICD-10-CM | POA: Diagnosis not present

## 2020-12-01 ENCOUNTER — Other Ambulatory Visit: Payer: Self-pay | Admitting: Cardiovascular Disease

## 2020-12-02 ENCOUNTER — Ambulatory Visit (INDEPENDENT_AMBULATORY_CARE_PROVIDER_SITE_OTHER): Payer: BC Managed Care – PPO | Admitting: Family Medicine

## 2020-12-02 ENCOUNTER — Other Ambulatory Visit: Payer: Self-pay

## 2020-12-02 VITALS — BP 110/64 | Ht 63.0 in | Wt 184.0 lb

## 2020-12-02 DIAGNOSIS — M7712 Lateral epicondylitis, left elbow: Secondary | ICD-10-CM

## 2020-12-02 MED ORDER — NITROGLYCERIN 0.2 MG/HR TD PT24
MEDICATED_PATCH | TRANSDERMAL | 11 refills | Status: DC
Start: 2020-12-02 — End: 2021-04-09
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
## 2020-12-02 NOTE — Progress Notes (Signed)
Medication Samples have been provided to the patient.  Drug name: Pennsaid       Strength: 2%        Qty: 2 boxes LOT: [id number]  Exp.Date: 11/27/2021  Dosing instructions: use a pea sized amount   The patient has been instructed regarding the correct time, dose, and frequency of taking this medication, including desired effects and most common side effects.   Jonathon Resides 11:39 AM 12/02/2020

## 2020-12-02 NOTE — Assessment & Plan Note (Signed)
Still having minor pain around the elbow.  Did get relief with the injection. -Counseled on home exercise therapy and supportive care. -Nitro patches. -Pennsaid samples. -Can consider shockwave therapy.

## 2020-12-02 NOTE — Patient Instructions (Signed)
Good to see you Please try the nitro  Please try ice   Please send me a message in MyChart with any questions or updates.  Please see me back in 6-8 weeks.   --Dr. Raeford Razor  Nitroglycerin Protocol  Apply 1/4 nitroglycerin patch to affected area daily. Change position of patch within the affected area every 24 hours. You may experience a headache during the first 1-2 weeks of using the patch, these should subside. If you experience headaches after beginning nitroglycerin patch treatment, you may take your preferred over the counter pain reliever. Another side effect of the nitroglycerin patch is skin irritation or rash related to patch adhesive. Please notify our office if you develop more severe headaches or rash, and stop the patch. Tendon healing with nitroglycerin patch may require 12 to 24 weeks depending on the extent of injury. Men should not use if taking Viagra, Cialis, or Levitra.  Do not use if you have migraines or rosacea.

## 2020-12-09 ENCOUNTER — Other Ambulatory Visit: Payer: Self-pay

## 2020-12-09 ENCOUNTER — Ambulatory Visit: Payer: Self-pay | Admitting: Family Medicine

## 2020-12-09 ENCOUNTER — Encounter: Payer: Self-pay | Admitting: Family Medicine

## 2020-12-09 DIAGNOSIS — M7712 Lateral epicondylitis, left elbow: Secondary | ICD-10-CM

## 2020-12-09 NOTE — Assessment & Plan Note (Signed)
Completed shockwave today.
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
## 2020-12-16 ENCOUNTER — Other Ambulatory Visit: Payer: Self-pay

## 2020-12-16 ENCOUNTER — Ambulatory Visit (INDEPENDENT_AMBULATORY_CARE_PROVIDER_SITE_OTHER): Payer: BC Managed Care – PPO | Admitting: Family Medicine

## 2020-12-16 ENCOUNTER — Ambulatory Visit: Payer: Self-pay

## 2020-12-16 ENCOUNTER — Encounter: Payer: Self-pay | Admitting: Family Medicine

## 2020-12-16 ENCOUNTER — Ambulatory Visit: Payer: Self-pay | Admitting: Family Medicine

## 2020-12-16 ENCOUNTER — Other Ambulatory Visit: Payer: Self-pay | Admitting: Cardiovascular Disease

## 2020-12-16 VITALS — Ht 64.0 in | Wt 183.0 lb

## 2020-12-16 DIAGNOSIS — M23351 Other meniscus derangements, posterior horn of lateral meniscus, right knee: Secondary | ICD-10-CM

## 2020-12-16 DIAGNOSIS — M25861 Other specified joint disorders, right knee: Secondary | ICD-10-CM | POA: Diagnosis not present

## 2020-12-16 DIAGNOSIS — M7712 Lateral epicondylitis, left elbow: Secondary | ICD-10-CM

## 2020-12-16 MED ORDER — METHYLPREDNISOLONE ACETATE 40 MG/ML IJ SUSP
40.0000 mg | Freq: Once | INTRAMUSCULAR | Status: AC
  Administered 2020-12-16: 40 mg via INTRA_ARTICULAR
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
## 2020-12-16 NOTE — Assessment & Plan Note (Signed)
There appears to be a cyst in the posterior lateral corner  causing her pain.   -Counseled on home exercise therapy and supportive care. -Injection today. -Could consider PRP or further imaging.

## 2020-12-16 NOTE — Assessment & Plan Note (Signed)
Completed shockwave today.

## 2020-12-23 ENCOUNTER — Ambulatory Visit: Payer: Medicare Other | Admitting: Family Medicine
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
## 2021-01-05 ENCOUNTER — Other Ambulatory Visit: Payer: Self-pay

## 2021-01-05 ENCOUNTER — Encounter: Payer: Self-pay | Admitting: Family Medicine

## 2021-01-05 ENCOUNTER — Ambulatory Visit: Payer: Self-pay | Admitting: Family Medicine

## 2021-01-05 DIAGNOSIS — M7712 Lateral epicondylitis, left elbow: Secondary | ICD-10-CM
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
## 2021-01-05 NOTE — Assessment & Plan Note (Signed)
Completed shockwave today.

## 2021-01-13 ENCOUNTER — Other Ambulatory Visit: Payer: Self-pay | Admitting: Cardiovascular Disease

## 2021-01-13 ENCOUNTER — Ambulatory Visit: Payer: Medicare Other | Admitting: Family Medicine

## 2021-01-21 ENCOUNTER — Other Ambulatory Visit: Payer: Self-pay | Admitting: Cardiovascular Disease

## 2021-02-03 ENCOUNTER — Other Ambulatory Visit: Payer: Self-pay | Admitting: Cardiovascular Disease

## 2021-03-17 DIAGNOSIS — R399 Unspecified symptoms and signs involving the genitourinary system: Secondary | ICD-10-CM | POA: Diagnosis not present

## 2021-04-09 ENCOUNTER — Ambulatory Visit (INDEPENDENT_AMBULATORY_CARE_PROVIDER_SITE_OTHER): Payer: BC Managed Care – PPO | Admitting: Cardiovascular Disease

## 2021-04-09 ENCOUNTER — Other Ambulatory Visit: Payer: Self-pay

## 2021-04-09 ENCOUNTER — Encounter: Payer: Self-pay | Admitting: Cardiovascular Disease

## 2021-04-09 VITALS — BP 140/78 | HR 58 | Ht 63.0 in | Wt 184.0 lb

## 2021-04-09 DIAGNOSIS — E785 Hyperlipidemia, unspecified: Secondary | ICD-10-CM

## 2021-04-09 DIAGNOSIS — I471 Supraventricular tachycardia: Secondary | ICD-10-CM

## 2021-04-09 DIAGNOSIS — I10 Essential (primary) hypertension: Secondary | ICD-10-CM | POA: Diagnosis not present

## 2021-04-09 MED ORDER — ATORVASTATIN CALCIUM 40 MG PO TABS: 40.0000 mg | ORAL_TABLET | Freq: Every day | ORAL | 0 refills | Status: DC

## 2021-04-09 MED ORDER — LISINOPRIL 10 MG PO TABS: 10.0000 mg | ORAL_TABLET | Freq: Every day | ORAL | 0 refills | Status: DC

## 2021-04-09 MED ORDER — LISINOPRIL 10 MG PO TABS: 10.0000 mg | ORAL_TABLET | Freq: Every day | ORAL | 3 refills | Status: DC

## 2021-04-09 MED ORDER — ATORVASTATIN CALCIUM 40 MG PO TABS: 40.0000 mg | ORAL_TABLET | Freq: Every day | ORAL | 3 refills | Status: DC

## 2021-04-09 NOTE — Patient Instructions (Signed)
Medication Instructions:  Your physician recommends that you continue on your current medications as directed. Please refer to the Current Medication list given to you today.  *If you need a refill on your cardiac medications before your next appointment, please call your pharmacy*   Lab Work: Lipid Panel, ALT and BMET today  If you have labs (blood work) drawn today and your tests are completely normal, you will receive your results only by: [place] (if you have MyChart) OR A paper copy in the mail If you have any lab test that is abnormal or we need to change your treatment, we will call you to review the results.   Testing/Procedures: None ordered.    Follow-Up: At [facility], you and your health needs are our priority.  As part of our continuing mission to provide you with exceptional heart care, we have created designated Provider Care Teams.  These Care Teams include your primary Cardiologist (physician) and Advanced Practice Providers (APPs -  Physician Assistants and Nurse Practitioners) who all work together to provide you with the care you need, when you need it.  We recommend signing up for the patient portal called "MyChart".  Sign up information is provided on this After Visit Summary.  MyChart is used to connect with patients for Virtual Visits (Telemedicine).  Patients are able to view lab/test results, encounter notes, upcoming appointments, etc.  Non-urgent messages can be sent to your provider as well.   To learn more about what you can do with MyChart, go to [email or web address].    Your next appointment:   1 year with Dr Cathren Laine    Other Instructions None
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
## 2021-04-10 LAB — LIPID PANEL
Chol/HDL Ratio: 2.4 ratio (ref 0.0–4.4)
Cholesterol, Total: 142 mg/dL (ref 100–199)
HDL: 58 mg/dL (ref >39)
LDL Chol Calc (NIH): 70 mg/dL (ref 0–99)
Triglycerides: 67 mg/dL (ref 0–149)
VLDL Cholesterol Cal: 14 mg/dL (ref 5–40)

## 2021-04-10 LAB — BASIC METABOLIC PANEL
BUN/Creatinine Ratio: 21 (ref 9–23)
BUN: 15 mg/dL (ref 6–24)
CO2: 25 mmol/L (ref 20–29)
Calcium: 9.3 mg/dL (ref 8.7–10.2)
Chloride: 104 mmol/L (ref 96–106)
Creatinine, Ser: 0.72 mg/dL (ref 0.57–1.00)
Glucose: 81 mg/dL (ref 70–99)
Potassium: 4 mmol/L (ref 3.5–5.2)
Sodium: 143 mmol/L (ref 134–144)
eGFR: 103 mL/min/{1.73_m2} (ref >59)

## 2021-04-10 LAB — ALT: ALT: 35 IU/L — ABNORMAL HIGH (ref 0–32)

## 2021-04-18 ENCOUNTER — Other Ambulatory Visit: Payer: Self-pay | Admitting: Cardiovascular Disease

## 2021-05-10 DIAGNOSIS — N3010 Interstitial cystitis (chronic) without hematuria: Secondary | ICD-10-CM | POA: Diagnosis not present

## 2021-05-10 DIAGNOSIS — N9489 Other specified conditions associated with female genital organs and menstrual cycle: Secondary | ICD-10-CM | POA: Diagnosis not present

## 2021-05-10 DIAGNOSIS — N393 Stress incontinence (female) (male): Secondary | ICD-10-CM | POA: Diagnosis not present

## 2021-05-28 DIAGNOSIS — N9489 Other specified conditions associated with female genital organs and menstrual cycle: Secondary | ICD-10-CM | POA: Diagnosis not present

## 2021-05-28 DIAGNOSIS — F32A Depression, unspecified: Secondary | ICD-10-CM | POA: Diagnosis not present

## 2021-05-28 DIAGNOSIS — R41840 Attention and concentration deficit: Secondary | ICD-10-CM | POA: Diagnosis not present

## 2021-05-28 DIAGNOSIS — F419 Anxiety disorder, unspecified: Secondary | ICD-10-CM | POA: Diagnosis not present

## 2021-07-05 ENCOUNTER — Ambulatory Visit (INDEPENDENT_AMBULATORY_CARE_PROVIDER_SITE_OTHER): Payer: BC Managed Care – PPO | Admitting: Family Medicine

## 2021-07-05 VITALS — BP 114/64 | Ht 62.0 in | Wt 152.2 lb

## 2021-07-05 DIAGNOSIS — S39012A Strain of muscle, fascia and tendon of lower back, initial encounter: Secondary | ICD-10-CM | POA: Diagnosis not present

## 2021-07-05 MED ORDER — METHYLPREDNISOLONE ACETATE 40 MG/ML IJ SUSP
40.0000 mg | Freq: Once | INTRAMUSCULAR | Status: AC
  Administered 2021-07-05: 40 mg via INTRAMUSCULAR

## 2021-07-05 NOTE — Assessment & Plan Note (Signed)
Acutely occurring.  Seems spasm related.  No radicular component. -Counseled on home exercise therapy and supportive care. -Trigger point injections today. -Could consider physical therapy or further imaging.
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
## 2021-07-05 NOTE — Patient Instructions (Signed)
Good to see you Please try heat  Please try the exercises   Please send me a message in MyChart with any questions or updates.  Please see me back in 2-3 weeks.   --Dr. Raeford Razor

## 2021-07-19 ENCOUNTER — Ambulatory Visit: Payer: Medicare Other | Admitting: Family Medicine

## 2021-08-27 ENCOUNTER — Ambulatory Visit: Payer: Medicare Other | Admitting: Family Medicine

## 2021-08-27 ENCOUNTER — Telehealth: Payer: Self-pay | Admitting: Family Medicine

## 2021-08-27 NOTE — Telephone Encounter (Signed)
Pt did not show for her NP app with Dr. Gena Fray on 08/27/21, I sent a no show letter.  ?

## 2021-09-07 DIAGNOSIS — R634 Abnormal weight loss: Secondary | ICD-10-CM | POA: Diagnosis not present

## 2021-09-07 DIAGNOSIS — F32A Depression, unspecified: Secondary | ICD-10-CM | POA: Diagnosis not present

## 2021-09-07 DIAGNOSIS — Z79899 Other long term (current) drug therapy: Secondary | ICD-10-CM | POA: Diagnosis not present

## 2021-09-07 DIAGNOSIS — Z6822 Body mass index (BMI) 22.0-22.9, adult: Secondary | ICD-10-CM | POA: Diagnosis not present

## 2021-09-07 DIAGNOSIS — Z0000 Encounter for general adult medical examination without abnormal findings: Secondary | ICD-10-CM | POA: Diagnosis not present

## 2021-09-07 DIAGNOSIS — N3010 Interstitial cystitis (chronic) without hematuria: Secondary | ICD-10-CM | POA: Diagnosis not present

## 2021-09-07 DIAGNOSIS — Z0001 Encounter for general adult medical examination with abnormal findings: Secondary | ICD-10-CM | POA: Diagnosis not present

## 2021-09-07 DIAGNOSIS — I10 Essential (primary) hypertension: Secondary | ICD-10-CM | POA: Diagnosis not present

## 2021-09-07 DIAGNOSIS — I509 Heart failure, unspecified: Secondary | ICD-10-CM | POA: Diagnosis not present

## 2021-09-07 DIAGNOSIS — R630 Anorexia: Secondary | ICD-10-CM | POA: Diagnosis not present

## 2021-09-07 DIAGNOSIS — Z1159 Encounter for screening for other viral diseases: Secondary | ICD-10-CM | POA: Diagnosis not present

## 2021-09-07 DIAGNOSIS — F411 Generalized anxiety disorder: Secondary | ICD-10-CM | POA: Diagnosis not present

## 2021-09-07 DIAGNOSIS — E785 Hyperlipidemia, unspecified: Secondary | ICD-10-CM | POA: Diagnosis not present

## 2021-09-07 NOTE — Telephone Encounter (Signed)
1st no show, fee waived ?

## 2021-09-15 ENCOUNTER — Ambulatory Visit (INDEPENDENT_AMBULATORY_CARE_PROVIDER_SITE_OTHER): Payer: BC Managed Care – PPO | Admitting: Family Medicine

## 2021-09-15 ENCOUNTER — Ambulatory Visit: Payer: Self-pay

## 2021-09-15 ENCOUNTER — Encounter: Payer: Self-pay | Admitting: Family Medicine

## 2021-09-15 VITALS — BP 102/68 | Ht 62.0 in | Wt 130.0 lb

## 2021-09-15 DIAGNOSIS — M25561 Pain in right knee: Secondary | ICD-10-CM

## 2021-09-15 DIAGNOSIS — M23203 Derangement of unspecified medial meniscus due to old tear or injury, right knee: Secondary | ICD-10-CM
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
## 2021-09-15 NOTE — Patient Instructions (Signed)
Good to see you ?Please use ice as needed ?Please try the exercises   ?Please send me a message in MyChart with any questions or updates.  ?Please see me back in 4-6 weeks or as needed if better.  ? ?--Dr. Raeford Razor ? ?

## 2021-09-15 NOTE — Assessment & Plan Note (Signed)
Acutely occurring.  Having changes in the medial meniscus. ?-Counseled on home exercise therapy and supportive care. ?-Injection today. ?-Could consider physical therapy or further imaging. ?

## 2021-09-22 DIAGNOSIS — D2262 Melanocytic nevi of left upper limb, including shoulder: Secondary | ICD-10-CM | POA: Diagnosis not present

## 2021-09-22 DIAGNOSIS — D2261 Melanocytic nevi of right upper limb, including shoulder: Secondary | ICD-10-CM | POA: Diagnosis not present

## 2021-09-22 DIAGNOSIS — D225 Melanocytic nevi of trunk: Secondary | ICD-10-CM | POA: Diagnosis not present

## 2021-09-22 DIAGNOSIS — L812 Freckles: Secondary | ICD-10-CM | POA: Diagnosis not present

## 2021-10-27 DIAGNOSIS — Z1231 Encounter for screening mammogram for malignant neoplasm of breast: Secondary | ICD-10-CM | POA: Diagnosis not present

## 2021-10-27 DIAGNOSIS — Z01419 Encounter for gynecological examination (general) (routine) without abnormal findings: Secondary | ICD-10-CM | POA: Diagnosis not present

## 2021-11-01 DIAGNOSIS — Z1211 Encounter for screening for malignant neoplasm of colon: Secondary | ICD-10-CM | POA: Diagnosis not present

## 2021-11-01 DIAGNOSIS — R928 Other abnormal and inconclusive findings on diagnostic imaging of breast: Secondary | ICD-10-CM | POA: Diagnosis not present

## 2021-11-01 DIAGNOSIS — R921 Mammographic calcification found on diagnostic imaging of breast: Secondary | ICD-10-CM | POA: Diagnosis not present

## 2021-11-01 DIAGNOSIS — Z1212 Encounter for screening for malignant neoplasm of rectum: Secondary | ICD-10-CM | POA: Diagnosis not present

## 2021-11-01 DIAGNOSIS — R922 Inconclusive mammogram: Secondary | ICD-10-CM | POA: Diagnosis not present

## 2021-11-08 LAB — EXTERNAL GENERIC LAB PROCEDURE: COLOGUARD: NEGATIVE

## 2021-11-08 LAB — COLOGUARD: COLOGUARD: NEGATIVE

## 2021-11-22 DIAGNOSIS — G894 Chronic pain syndrome: Secondary | ICD-10-CM | POA: Diagnosis not present

## 2021-11-22 DIAGNOSIS — N3010 Interstitial cystitis (chronic) without hematuria: Secondary | ICD-10-CM | POA: Diagnosis not present

## 2021-11-22 DIAGNOSIS — N9489 Other specified conditions associated with female genital organs and menstrual cycle: Secondary | ICD-10-CM | POA: Diagnosis not present

## 2021-11-22 DIAGNOSIS — Z9682 Presence of neurostimulator: Secondary | ICD-10-CM | POA: Diagnosis not present

## 2022-01-17 DIAGNOSIS — N3010 Interstitial cystitis (chronic) without hematuria: Secondary | ICD-10-CM | POA: Diagnosis not present

## 2022-01-17 DIAGNOSIS — M7918 Myalgia, other site: Secondary | ICD-10-CM | POA: Diagnosis not present

## 2022-01-17 DIAGNOSIS — Z960 Presence of urogenital implants: Secondary | ICD-10-CM | POA: Diagnosis not present

## 2022-01-17 DIAGNOSIS — M7989 Other specified soft tissue disorders: Secondary | ICD-10-CM | POA: Diagnosis not present

## 2022-01-17 DIAGNOSIS — Z87442 Personal history of urinary calculi: Secondary | ICD-10-CM | POA: Diagnosis not present

## 2022-01-17 DIAGNOSIS — R102 Pelvic and perineal pain: Secondary | ICD-10-CM | POA: Diagnosis not present

## 2022-02-11 DIAGNOSIS — F3132 Bipolar disorder, current episode depressed, moderate: Secondary | ICD-10-CM | POA: Diagnosis not present

## 2022-02-11 DIAGNOSIS — Z23 Encounter for immunization: Secondary | ICD-10-CM | POA: Diagnosis not present

## 2022-02-11 DIAGNOSIS — F319 Bipolar disorder, unspecified: Secondary | ICD-10-CM | POA: Diagnosis not present

## 2022-03-07 DIAGNOSIS — T8384XA Pain from genitourinary prosthetic devices, implants and grafts, initial encounter: Secondary | ICD-10-CM | POA: Diagnosis not present

## 2022-03-07 DIAGNOSIS — R634 Abnormal weight loss: Secondary | ICD-10-CM | POA: Diagnosis not present

## 2022-03-07 DIAGNOSIS — T85840A Pain due to nervous system prosthetic devices, implants and grafts, initial encounter: Secondary | ICD-10-CM | POA: Diagnosis not present

## 2022-03-07 DIAGNOSIS — M7918 Myalgia, other site: Secondary | ICD-10-CM | POA: Diagnosis not present

## 2022-03-07 DIAGNOSIS — R3915 Urgency of urination: Secondary | ICD-10-CM | POA: Diagnosis not present

## 2022-03-07 DIAGNOSIS — N3010 Interstitial cystitis (chronic) without hematuria: Secondary | ICD-10-CM | POA: Diagnosis not present

## 2022-03-07 DIAGNOSIS — R350 Frequency of micturition: Secondary | ICD-10-CM | POA: Diagnosis not present

## 2022-03-07 DIAGNOSIS — Y758 Miscellaneous neurological devices associated with adverse incidents, not elsewhere classified: Secondary | ICD-10-CM | POA: Diagnosis not present

## 2022-03-23 DIAGNOSIS — F3132 Bipolar disorder, current episode depressed, moderate: Secondary | ICD-10-CM | POA: Diagnosis not present

## 2022-04-04 ENCOUNTER — Other Ambulatory Visit: Payer: Self-pay | Admitting: Cardiovascular Disease

## 2022-05-14 DIAGNOSIS — J101 Influenza due to other identified influenza virus with other respiratory manifestations: Secondary | ICD-10-CM | POA: Diagnosis not present

## 2022-06-22 ENCOUNTER — Encounter: Payer: Self-pay | Admitting: Cardiovascular Disease
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
## 2022-06-23 ENCOUNTER — Encounter: Payer: Self-pay | Admitting: Cardiovascular Disease

## 2022-06-23 ENCOUNTER — Ambulatory Visit: Payer: BC Managed Care – PPO | Attending: Cardiovascular Disease | Admitting: Cardiovascular Disease

## 2022-06-23 VITALS — BP 104/60 | HR 69 | Ht 62.0 in | Wt 175.6 lb

## 2022-06-23 DIAGNOSIS — E785 Hyperlipidemia, unspecified: Secondary | ICD-10-CM | POA: Diagnosis not present

## 2022-06-23 DIAGNOSIS — I10 Essential (primary) hypertension: Secondary | ICD-10-CM | POA: Diagnosis not present

## 2022-06-23 NOTE — Patient Instructions (Addendum)
Medication Instructions:  Your physician has recommended you make the following change in your medication:   1) STOP lisinopril  *If you need a refill on your cardiac medications before your next appointment, please call your pharmacy*  Lab Work: TODAY: Lipoprotein A (LPa), Lipid panel, ALT If you have labs (blood work) drawn today and your tests are completely normal, you will receive your results only by: [place] (if you have MyChart) OR A paper copy in the mail If you have any lab test that is abnormal or we need to change your treatment, we will call you to review the results.  Testing/Procedures: Your physician has requested that you have a coronary calcium score performed. This is not covered by insurance and will be an out-of-pocket cost of approximately $99.   Follow-Up: At [facility], you and your health needs are our priority.  As part of our continuing mission to provide you with exceptional heart care, we have created designated Provider Care Teams.  These Care Teams include your primary Cardiologist (physician) and Advanced Practice Providers (APPs -  Physician Assistants and Nurse Practitioners) who all work together to provide you with the care you need, when you need it.  Your next appointment:   3 month(s)  Provider:   Christen Bame, NP  Other Instructions Adopting a Healthy Lifestyle.   Weight: Know what a healthy weight is for you (roughly BMI <25) and aim to maintain this. You can calculate your body mass index on your smart phone  Diet: Aim for 7+ servings of fruits and vegetables daily Limit animal fats in diet for cholesterol and heart health - choose grass fed whenever available Avoid highly processed foods (fast food burgers, tacos, fried chicken, pizza, hot dogs, french fries)  Saturated fat comes in the form of butter, lard, coconut oil, margarine, partially hydrogenated oils, and fat in meat. These increase your risk of  cardiovascular disease.  Use healthy plant oils, such as olive, canola, soy, corn, sunflower and peanut.  Whole foods such as fruits, vegetables and whole grains have fiber  Men need > 38 grams of fiber per day Women need > 25 grams of fiber per day  Load up on vegetables and fruits - one-half of your plate: Aim for color and variety, and remember that potatoes dont count. Go for whole grains - one-quarter of your plate: Whole wheat, barley, wheat berries, quinoa, oats, brown rice, and foods made with them. If you want pasta, go with whole wheat pasta. Protein power - one-quarter of your plate: Fish, chicken, beans, and nuts are all healthy, versatile protein sources. Limit red meat. You need carbohydrates for energy! The type of carbohydrate is more important than the amount. Choose carbohydrates such as vegetables, fruits, whole grains, beans, and nuts in the place of white rice, white pasta, potatoes (baked or fried), macaroni and cheese, cakes, cookies, and donuts.  If youre thirsty, drink water. Coffee and tea are good in moderation, but skip sugary drinks and limit milk and dairy products to one or two daily servings. Keep sugar intake at 6 teaspoons or 24 grams or LESS       Exercise: Aim for 150 min of moderate intensity exercise weekly for heart health, and weights twice weekly for bone health Stay active - any steps are better than no steps! Aim for 7-9 hours of sleep daily   Tachycardia and some people did not tolerate it so so assumes that Amelia Jo so he would be great.  You have had tachycardia in the past and been listening to do is to just  Avoid foods that are White, wheat, sweet  - white foods - limit your  rice, pasta, potatoes, corn.  if you do eat these, make sure its brown rice, whole wheat pasta  - wheat - limit intake of all bread, biscuits, pizza dough.   The bread you do eat should be whole wheat bread.  - Sweets - limit cakes, cookies, desserts, ice cream, soda,  sweet tea   Increase exercise

## 2022-06-24 LAB — LIPID PANEL
Chol/HDL Ratio: 2.2 ratio (ref 0.0–4.4)
Cholesterol, Total: 177 mg/dL (ref 100–199)
HDL: 80 mg/dL (ref >39)
LDL Chol Calc (NIH): 83 mg/dL (ref 0–99)
Triglycerides: 76 mg/dL (ref 0–149)
VLDL Cholesterol Cal: 14 mg/dL (ref 5–40)

## 2022-06-24 LAB — LIPOPROTEIN A (LPA): Lipoprotein (a): 151 nmol/L — ABNORMAL HIGH (ref <75.0)

## 2022-06-24 LAB — ALT: ALT: 23 IU/L (ref 0–32)

## 2022-06-28 ENCOUNTER — Ambulatory Visit: Payer: Medicare Other | Admitting: Cardiovascular Disease

## 2022-07-18 ENCOUNTER — Ambulatory Visit (HOSPITAL_BASED_OUTPATIENT_CLINIC_OR_DEPARTMENT_OTHER)
Admission: RE | Admit: 2022-07-18 | Discharge: 2022-07-18 | Disposition: A | Discharge status: Home / Self Care | Payer: Medicare Other | Source: Ambulatory Visit | Attending: Cardiovascular Disease | Admitting: Cardiovascular Disease

## 2022-07-18 ENCOUNTER — Encounter (HOSPITAL_BASED_OUTPATIENT_CLINIC_OR_DEPARTMENT_OTHER): Payer: Self-pay

## 2022-07-18 DIAGNOSIS — E785 Hyperlipidemia, unspecified: Secondary | ICD-10-CM | POA: Insufficient documentation

## 2022-09-12 ENCOUNTER — Encounter: Payer: Self-pay | Admitting: *Deleted

## 2022-09-13 ENCOUNTER — Ambulatory Visit: Payer: Medicare Other | Admitting: Family Medicine

## 2022-09-19 ENCOUNTER — Encounter: Payer: Self-pay | Admitting: Family Medicine

## 2022-09-19 ENCOUNTER — Ambulatory Visit (HOSPITAL_BASED_OUTPATIENT_CLINIC_OR_DEPARTMENT_OTHER)
Admission: RE | Admit: 2022-09-19 | Discharge: 2022-09-19 | Disposition: A | Discharge status: Home / Self Care | Payer: BC Managed Care – PPO | Source: Ambulatory Visit | Attending: Family Medicine | Admitting: Family Medicine

## 2022-09-19 ENCOUNTER — Ambulatory Visit (INDEPENDENT_AMBULATORY_CARE_PROVIDER_SITE_OTHER): Payer: BC Managed Care – PPO | Admitting: Family Medicine

## 2022-09-19 ENCOUNTER — Other Ambulatory Visit: Payer: Self-pay

## 2022-09-19 VITALS — BP 104/70 | Ht 62.0 in | Wt 181.0 lb

## 2022-09-19 DIAGNOSIS — M23204 Derangement of unspecified medial meniscus due to old tear or injury, left knee: Secondary | ICD-10-CM | POA: Insufficient documentation

## 2022-09-19 DIAGNOSIS — M23203 Derangement of unspecified medial meniscus due to old tear or injury, right knee: Secondary | ICD-10-CM | POA: Insufficient documentation

## 2022-09-19 MED ORDER — TRIAMCINOLONE ACETONIDE 40 MG/ML IJ SUSP
40.0000 mg | Freq: Once | INTRAMUSCULAR | Status: AC
  Administered 2022-09-19: 40 mg via INTRA_ARTICULAR

## 2022-09-19 NOTE — Assessment & Plan Note (Signed)
Acutely occurring.  No injury or inciting event. -Counseled on home exercise therapy and supportive care. - X-ray.  -Injection today -Could consider physical therapy

## 2022-09-19 NOTE — Assessment & Plan Note (Signed)
Acute on chronic in nature.  Exacerbation of her underlying degenerative meniscal changes. -Counseled on home exercise therapy and supportive care. - Xray. - injection  - Could consider further imaging
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
## 2022-09-19 NOTE — Patient Instructions (Signed)
Good to see you Please use ice  We'll call with the xray results.   Please send me a message in MyChart with any questions or updates.  Please see me back as needed.   --Dr. Jordan Likes

## 2022-09-23 ENCOUNTER — Telehealth: Payer: Self-pay | Admitting: Family Medicine

## 2022-09-23 NOTE — Telephone Encounter (Signed)
Left VM for patient. If she calls back please have her speak with a nurse/CMA and inform that her xray is showing mild degenerative changes in the space behind the knee and on the inside of the knee .   If any questions then please take the best time and phone number to call and I will try to call her back.   Myra Rude, MD [facility] 09/23/2022, 8:29 AM

## 2022-09-26 ENCOUNTER — Ambulatory Visit: Payer: Medicare Other | Admitting: Nurse Practitioner

## 2022-09-26 ENCOUNTER — Ambulatory Visit: Payer: Medicare Other | Admitting: Physician Assistant

## 2023-02-28 ENCOUNTER — Encounter (HOSPITAL_COMMUNITY): Payer: Self-pay

## 2023-02-28 ENCOUNTER — Emergency Department (HOSPITAL_COMMUNITY): Payer: BC Managed Care – PPO

## 2023-02-28 ENCOUNTER — Emergency Department (HOSPITAL_COMMUNITY)
Admission: EM | Admit: 2023-02-28 | Discharge: 2023-03-01 | Disposition: A | Discharge status: Home / Self Care | Payer: BC Managed Care – PPO | Attending: Emergency Medicine | Admitting: Emergency Medicine

## 2023-02-28 DIAGNOSIS — R7401 Elevation of levels of liver transaminase levels: Secondary | ICD-10-CM | POA: Insufficient documentation

## 2023-02-28 DIAGNOSIS — I110 Hypertensive heart disease with heart failure: Secondary | ICD-10-CM | POA: Diagnosis not present

## 2023-02-28 DIAGNOSIS — R748 Abnormal levels of other serum enzymes: Secondary | ICD-10-CM | POA: Diagnosis not present

## 2023-02-28 DIAGNOSIS — D72829 Elevated white blood cell count, unspecified: Secondary | ICD-10-CM | POA: Insufficient documentation

## 2023-02-28 DIAGNOSIS — I509 Heart failure, unspecified: Secondary | ICD-10-CM | POA: Insufficient documentation

## 2023-02-28 DIAGNOSIS — N3000 Acute cystitis without hematuria: Secondary | ICD-10-CM | POA: Insufficient documentation

## 2023-02-28 DIAGNOSIS — R1013 Epigastric pain: Secondary | ICD-10-CM

## 2023-02-28 DIAGNOSIS — Z79899 Other long term (current) drug therapy: Secondary | ICD-10-CM | POA: Diagnosis not present

## 2023-02-28 DIAGNOSIS — R112 Nausea with vomiting, unspecified: Secondary | ICD-10-CM | POA: Insufficient documentation

## 2023-02-28 DIAGNOSIS — Z7982 Long term (current) use of aspirin: Secondary | ICD-10-CM | POA: Diagnosis not present

## 2023-02-28 LAB — CBC WITH DIFFERENTIAL/PLATELET
Abs Immature Granulocytes: 0.05 10*3/uL (ref 0.00–0.07)
Basophils Absolute: 0 10*3/uL (ref 0.0–0.1)
Basophils Relative: 0 %
Eosinophils Absolute: 0 10*3/uL (ref 0.0–0.5)
Eosinophils Relative: 0 %
HCT: 40.6 % (ref 36.0–46.0)
Hemoglobin: 13.1 g/dL (ref 12.0–15.0)
Immature Granulocytes: 0 %
Lymphocytes Relative: 15 %
Lymphs Abs: 2.1 10*3/uL (ref 0.7–4.0)
MCH: 27.4 pg (ref 26.0–34.0)
MCHC: 32.3 g/dL (ref 30.0–36.0)
MCV: 84.9 fL (ref 80.0–100.0)
Monocytes Absolute: 0.6 10*3/uL (ref 0.1–1.0)
Monocytes Relative: 4 %
Neutro Abs: 11.1 10*3/uL — ABNORMAL HIGH (ref 1.7–7.7)
Neutrophils Relative %: 81 %
Platelets: 296 10*3/uL (ref 150–400)
RBC: 4.78 MIL/uL (ref 3.87–5.11)
RDW: 12.8 % (ref 11.5–15.5)
WBC: 13.9 10*3/uL — ABNORMAL HIGH (ref 4.0–10.5)
nRBC: 0 % (ref 0.0–0.2)

## 2023-02-28 MED ORDER — FENTANYL CITRATE PF 50 MCG/ML IJ SOSY
50.0000 ug | PREFILLED_SYRINGE | Freq: Once | INTRAMUSCULAR | Status: AC
  Administered 2023-02-28: 50 ug via INTRAVENOUS
  Filled 2023-02-28: qty 1

## 2023-02-28 MED ORDER — ONDANSETRON HCL 4 MG/2ML IJ SOLN
4.0000 mg | Freq: Once | INTRAMUSCULAR | Status: AC
  Administered 2023-02-28: 4 mg via INTRAVENOUS
  Filled 2023-02-28: qty 2

## 2023-02-28 NOTE — ED Provider Notes (Signed)
[place] EMERGENCY DEPARTMENT AT [facility] Provider Note   CSN: [id number] Arrival date & time: 02/28/23  2208     History Chief Complaint  Patient presents with   Abdominal Pain    [name] is a 52 y.o. female.  Patient with past history significant for mitral valve prolapse, seizure disorder, fibromyalgia, CHF, hypertension, hypercholesterolemia presents to the emergency department with concerns of abdominal pain.  Reports that she began to experience an episode of severe epigastric abdominal pain as well as nausea and vomiting about 1 hour after eating dinner and taking some medications.  States that this happened while she was sitting on the couch.  Denies any high fat intake recently.  No prior history of pancreatitis.  Denies any significant past cardiac history but was advised to take 324 mg of aspirin by 911 when call was placed to EMS.  Endorses that she had diaphoresis with the associated nausea and vomiting but does not feel that she was having any radiation of the epigastric pain into the chest.  Denies any shortness of breath.   Abdominal Pain Associated symptoms: nausea and vomiting        Home Medications Prior to Admission medications   Medication Sig Start Date End Date Taking? Authorizing Provider  cephALEXin (KEFLEX) 500 MG capsule Take 1 capsule (500 mg total) by mouth 4 (four) times daily for 7 days. 03/01/23 03/08/23 Yes Maryanna Shape A, PA-C  ondansetron (ZOFRAN-ODT) 4 MG disintegrating tablet Take 1 tablet (4 mg total) by mouth every 8 (eight) hours as needed. 03/01/23  Yes Smitty Knudsen, PA-C  oxyCODONE-acetaminophen (PERCOCET/ROXICET) 5-325 MG tablet Take 1 tablet by mouth every 6 (six) hours as needed for severe pain. 03/01/23  Yes Maryanna Shape A, PA-C  atorvastatin (LIPITOR) 20 MG tablet Take 20 mg by mouth daily. 07/10/17   [provider]  hydrOXYzine (VISTARIL) 50 MG capsule Take 50 mg by mouth at bedtime.  12/30/16   [provider]  ibuprofen (ADVIL) 200 MG tablet Take 800 mg by mouth every 6 (six) hours as needed for moderate pain.    [provider]  LORazepam (ATIVAN) 0.5 MG tablet Take 0.5 mg by mouth daily as needed for anxiety.     [provider]  traZODone (DESYREL) 100 MG tablet Take 100 mg by mouth at bedtime.  09/01/11   [provider]      Allergies    Augmentin [amoxicillin-pot clavulanate], Bisacodyl, and Adhesive [tape]    Review of Systems   Review of Systems  Gastrointestinal:  Positive for abdominal pain, nausea and vomiting.  All other systems reviewed and are negative.   Physical Exam Updated Vital Signs BP 119/69 (BP Location: Right Arm)   Pulse 61   Temp 97.9 F (36.6 C) (Oral)   Resp 16   LMP 02/19/2017 (Approximate)   SpO2 96%  Physical Exam Vitals and nursing note reviewed.  Constitutional:      General: She is not in acute distress.    Appearance: She is well-developed.  HENT:     Head: Normocephalic and atraumatic.  Eyes:     Conjunctiva/sclera: Conjunctivae normal.  Cardiovascular:     Rate and Rhythm: Normal rate and regular rhythm.     Heart sounds: No murmur heard. Pulmonary:     Effort: Pulmonary effort is normal. No respiratory distress.     Breath sounds: Normal breath sounds.  Abdominal:     Palpations: Abdomen is soft.  Tenderness: There is abdominal tenderness in the epigastric area.  Musculoskeletal:        General: No swelling.     Cervical back: Neck supple.  Skin:    General: Skin is warm and dry.     Capillary Refill: Capillary refill takes less than 2 seconds.  Neurological:     Mental Status: She is alert.  Psychiatric:        Mood and Affect: Mood normal.     ED Results / Procedures / Treatments   Labs (all labs ordered are listed, but only abnormal results are displayed) Labs Reviewed  CBC WITH DIFFERENTIAL/PLATELET - Abnormal; Notable for the following components:      Result Value   WBC 13.9  (*)    Neutro Abs 11.1 (*)    All other components within normal limits  COMPREHENSIVE METABOLIC PANEL - Abnormal; Notable for the following components:   Glucose, Bld 129 (*)    Total Protein 6.4 (*)    AST 128 (*)    ALT 83 (*)    All other components within normal limits  LIPASE, BLOOD - Abnormal; Notable for the following components:   Lipase 105 (*)    All other components within normal limits  URINALYSIS, ROUTINE W REFLEX MICROSCOPIC - Abnormal; Notable for the following components:   Color, Urine AMBER (*)    APPearance HAZY (*)    Specific Gravity, Urine 1.031 (*)    Ketones, ur 5 (*)    Protein, ur 30 (*)    Bacteria, UA RARE (*)    All other components within normal limits  URINE CULTURE  HCG, QUANTITATIVE, PREGNANCY  TROPONIN I (HIGH SENSITIVITY)  TROPONIN I (HIGH SENSITIVITY)    EKG EKG Interpretation Date/Time:  Tuesday February 28 2023 22:09:17 EDT Ventricular Rate:  71 PR Interval:  123 QRS Duration:  102 QT Interval:  434 QTC Calculation: 472 R Axis:   70  Text Interpretation: Sinus rhythm Confirmed by Gerhard Munch [phone]) on 02/28/2023 10:30:53 PM  Radiology CT ABDOMEN PELVIS W CONTRAST  Result Date: 03/01/2023 CLINICAL DATA:  Acute abdominal pain with nausea and vomiting, initial encounter EXAM: CT ABDOMEN AND PELVIS WITH CONTRAST TECHNIQUE: Multidetector CT imaging of the abdomen and pelvis was performed using the standard protocol following bolus administration of intravenous contrast. RADIATION DOSE REDUCTION: This exam was performed according to the departmental dose-optimization program which includes automated exposure control, adjustment of the mA and/or kV according to patient size and/or use of iterative reconstruction technique. CONTRAST:  75mL OMNIPAQUE IOHEXOL 350 MG/ML SOLN COMPARISON:  08/09/2019 FINDINGS: Lower chest: No acute abnormality. Hepatobiliary: No focal liver abnormality is seen. Status post cholecystectomy. No biliary dilatation.  Pancreas: Unremarkable. No pancreatic ductal dilatation or surrounding inflammatory changes. Spleen: Normal in size without focal abnormality. Adrenals/Urinary Tract: Adrenal glands are within normal limits. Kidneys are well visualized bilaterally within normal enhancement pattern. Nonobstructing renal calculi are noted bilaterally measuring up to 3 mm in the lower pole of the left kidney. Bladder is partially distended. No obstructive changes are noted. Stomach/Bowel: The appendix is within normal limits. No obstructive or inflammatory changes of the colon are seen. Stomach is within normal limits. Small bowel shows no obstructive changes. Vascular/Lymphatic: No significant vascular findings are present. No enlarged abdominal or pelvic lymph nodes. Reproductive: Status post hysterectomy. No adnexal masses. Other: No abdominal wall hernia or abnormality. No abdominopelvic ascites. Musculoskeletal: InterStim device is noted in the left pelvis. No acute bony abnormality is noted. IMPRESSION: Nonobstructing renal  calculi bilaterally. No other focal abnormality is noted. Electronically Signed   By: Alcide Clever M.D.   On: 03/01/2023 02:38   DG Chest 2 View  Result Date: 02/28/2023 CLINICAL DATA:  Mid chest pain, upper abdomen, and back pain. EXAM: CHEST - 2 VIEW COMPARISON:  10/19/2018 FINDINGS: Shallow inspiration. Heart size and pulmonary vascularity are normal. Lungs are clear. No pleural effusions. No pneumothorax. Mediastinal contours appear intact. IMPRESSION: No active cardiopulmonary disease. Electronically Signed   By: Burman Nieves M.D.   On: 02/28/2023 23:28    Procedures Procedures   Medications Ordered in ED Medications  ondansetron [facility]) injection 4 mg (4 mg Intravenous Given 02/28/23 2334)  fentaNYL (SUBLIMAZE) injection 50 mcg (50 mcg Intravenous Given 02/28/23 2336)  sodium chloride 0.9 % bolus 1,000 mL (0 mLs Intravenous Stopped 03/01/23 0254)  morphine (PF) 4 MG/ML injection 4 mg (4  mg Intravenous Given 03/01/23 0217)  iohexol (OMNIPAQUE) 350 MG/ML injection 75 mL (75 mLs Intravenous Contrast Given 03/01/23 0210)    ED Course/ Medical Decision Making/ A&P                               Medical Decision Making Amount and/or Complexity of Data Reviewed Labs: ordered. Radiology: ordered.  Risk Prescription drug management.   This patient presents to the ED for concern of abdominal pain.  Differential diagnosis includes pancreatitis, bowel obstruction, cholecystitis, pyelonephritis   Lab Tests:  I Ordered, and personally interpreted labs.  The pertinent results include: CBC with leukocytosis at 13.9, CMP with slight elevation in AST and ALT, UA without obvious signs of infection but bacteria noted, hCG negative, lipase slightly elevated at 105, troponin negative   Imaging Studies ordered:  I ordered imaging studies including chest x-ray, chest x-ray, CT abdomen pelvis I independently visualized and interpreted imaging which showed no acute cardiopulmonary process, nonobstructing bilateral renal calculi I agree with the radiologist interpretation   Medicines ordered and prescription drug management:  I ordered medication including fentanyl, Zofran for pain, nausea Reevaluation of the patient after these medicines showed that the patient improved I have reviewed the patients home medicines and have made adjustments as needed   Problem List / ED Course:  Patient with past history significant for CHF, hypertension, fibromyalgia, seizure disorder, mitral valve prolapse and hypercholesterolemia presented to the emergency department concerns of abdominal pain.  Reports the pain is localized to the epigastrium with associated nausea and vomiting.  Does state that she also had an episode of some diaphoresis as this was all going on.  Denies any focal chest pain or shortness of breath at the time of the episode.  No prior cardiac history.  On exam, patient does appear to  be uncomfortable with tenderness focal to the epigastrium with no other area of pain.  Does endorse a history of interstitial cystitis at times last significant abdominal pain with this but states that this feels different.  Workup with CBC, CMP, troponin, UA, lipase and quantitative hCG. CBC with mild leukocytosis of 13.9 and lab work otherwise unremarkable.  Possible evidence of infection as patient is having some dysuria and some bacteria noted on UA but no leukocytes or nitrites.  Lipase negative at 105 so possible early pancreatitis.  Troponin unremarkable, no acute signs of dehydration, hCG negative.  Urine culture pending. CT imaging is reassuring without any acute findings noted to explain patient's epigastric tenderness. States notable improvement with medications here in the  ED. No longer endorsing pain.  Given reassuring workup, unsure what to make of epigastric pain. However, given bacteria and dysuria patient is reporting, will initiate treatment for possible UTI. Doubt this is causing epigastric pain and is more of an incidental finding.  Prescriptions for Percocet, Keflex, and Zofran sent to patient's pharmacy. Patient agreeable with current treatment plan. Patient discharged home in stable condition.   Final Clinical Impression(s) / ED Diagnoses Final diagnoses:  Epigastric abdominal pain  Acute cystitis without hematuria    Rx / DC Orders ED Discharge Orders          Ordered    ondansetron (ZOFRAN-ODT) 4 MG disintegrating tablet  Every 8 hours PRN        03/01/23 0326    oxyCODONE-acetaminophen (PERCOCET/ROXICET) 5-325 MG tablet  Every 6 hours PRN        03/01/23 0326    cephALEXin (KEFLEX) 500 MG capsule  4 times daily        03/01/23 0326              Smitty Knudsen, PA-C 03/02/23 [id number]    Shon Baton, MD 03/03/23 [phone]

## 2023-02-28 NOTE — ED Triage Notes (Signed)
Epigasric pain, n/v , sudden onset at home while sitting on the couch.

## 2023-02-28 NOTE — ED Provider Notes (Incomplete)
[place] EMERGENCY DEPARTMENT AT [facility] Provider Note   CSN: [id number] Arrival date & time: 02/28/23  2208     History Chief Complaint  Patient presents with  . Abdominal Pain    AMI [name] is a 52 y.o. female.  Patient with past history significant for mitral valve prolapse, seizure disorder, fibromyalgia, CHF, hypertension, hypercholesterolemia presents to the emergency department with concerns of abdominal pain.  Reports that she began to experience an episode of severe epigastric abdominal pain as well as nausea and vomiting about 1 hour after eating dinner and taking some medications.  States that this happened while she was sitting on the couch.  Denies any high fat intake recently.  No prior history of pancreatitis.  Denies any significant past cardiac history but was advised to take 324 mg of aspirin by 911 when call was placed to EMS.  Endorses that she had diaphoresis with the associated nausea and vomiting but does not feel that she was having any radiation of the epigastric pain into the chest.  Denies any shortness of breath.   Abdominal Pain Associated symptoms: nausea and vomiting        Home Medications Prior to Admission medications   Medication Sig Start Date End Date Taking? Authorizing Provider  atorvastatin (LIPITOR) 20 MG tablet Take 20 mg by mouth daily. 07/10/17   [provider]  hydrOXYzine (VISTARIL) 50 MG capsule Take 50 mg by mouth at bedtime.  12/30/16   [provider]  ibuprofen (ADVIL) 200 MG tablet Take 800 mg by mouth every 6 (six) hours as needed for moderate pain.    [provider]  LORazepam (ATIVAN) 0.5 MG tablet Take 0.5 mg by mouth daily as needed for anxiety.     [provider]  oxyCODONE-acetaminophen (PERCOCET/ROXICET) 5-325 MG tablet Take 1 tablet by mouth 2 (two) times daily as needed. 09/12/19   [provider]  traZODone (DESYREL) 100 MG tablet Take 100 mg by mouth at  bedtime.  09/01/11   [provider]      Allergies    Augmentin [amoxicillin-pot clavulanate], Bisacodyl, and Adhesive [tape]    Review of Systems   Review of Systems  Gastrointestinal:  Positive for abdominal pain, nausea and vomiting.  All other systems reviewed and are negative.   Physical Exam Updated Vital Signs Pulse 70   Resp 14   LMP 02/19/2017 (Approximate)   SpO2 98%  Physical Exam Vitals and nursing note reviewed.  Constitutional:      General: She is not in acute distress.    Appearance: She is well-developed.  HENT:     Head: Normocephalic and atraumatic.  Eyes:     Conjunctiva/sclera: Conjunctivae normal.  Cardiovascular:     Rate and Rhythm: Normal rate and regular rhythm.     Heart sounds: No murmur heard. Pulmonary:     Effort: Pulmonary effort is normal. No respiratory distress.     Breath sounds: Normal breath sounds.  Abdominal:     Palpations: Abdomen is soft.     Tenderness: There is abdominal tenderness in the epigastric area.  Musculoskeletal:        General: No swelling.     Cervical back: Neck supple.  Skin:    General: Skin is warm and dry.     Capillary Refill: Capillary refill takes less than 2 seconds.  Neurological:     Mental Status: She is alert.  Psychiatric:        Mood and  Affect: Mood normal.     ED Results / Procedures / Treatments   Labs (all labs ordered are listed, but only abnormal results are displayed) Labs Reviewed  CBC WITH DIFFERENTIAL/PLATELET  COMPREHENSIVE METABOLIC PANEL  LIPASE, BLOOD  URINALYSIS, ROUTINE W REFLEX MICROSCOPIC  HCG, QUANTITATIVE, PREGNANCY  TROPONIN I (HIGH SENSITIVITY)    EKG None  Radiology No results found.  Procedures Procedures   Medications Ordered in ED Medications  ondansetron (ZOFRAN) injection 4 mg (has no administration in time range)  fentaNYL (SUBLIMAZE) injection 50 mcg (has no administration in time range)    ED Course/ Medical Decision Making/ A&P                                Medical Decision Making Amount and/or Complexity of Data Reviewed Labs: ordered. Radiology: ordered.  Risk Prescription drug management.   This patient presents to the ED for concern of abdominal pain.  Differential diagnosis includes pancreatitis, bowel obstruction, cholecystitis, pyelonephritis   Lab Tests:  I Ordered, and personally interpreted labs.  The pertinent results include:  ***   Imaging Studies ordered:  I ordered imaging studies including chest x-ray,*** I independently visualized and interpreted imaging which showed *** I agree with the radiologist interpretation   Medicines ordered and prescription drug management:  I ordered medication including fentanyl, Zofran for pain, nausea Reevaluation of the patient after these medicines showed that the patient {resolved/improved/worsened:23923::"improved"} I have reviewed the patients home medicines and have made adjustments as needed   Problem List / ED Course:  Patient with past history significant for CHF, hypertension, fibromyalgia, seizure disorder, mitral valve prolapse and hypercholesterolemia presented to the emergency department concerns of abdominal pain.  Reports the pain is localized to the epigastrium with associated nausea and vomiting.  Does state that she also had an episode of some diaphoresis as this was all going on.  Denies any focal chest pain or shortness of breath at the time of the episode.  No prior cardiac history.  On exam, patient does appear to be uncomfortable with tenderness focal to the epigastrium with no other area of pain.  Does endorse a history of interstitial cystitis at times last significant abdominal pain with this but states that this feels different.  Workup with CBC, CMP, troponin, UA, lipase and quantitative hCG.    Final Clinical Impression(s) / ED Diagnoses Final diagnoses:  None    Rx / DC Orders ED Discharge Orders     None

## 2023-03-01 ENCOUNTER — Emergency Department (HOSPITAL_COMMUNITY): Payer: BC Managed Care – PPO

## 2023-03-01 ENCOUNTER — Other Ambulatory Visit: Payer: Self-pay

## 2023-03-01 DIAGNOSIS — N3000 Acute cystitis without hematuria: Secondary | ICD-10-CM | POA: Diagnosis not present

## 2023-03-01 LAB — URINALYSIS, ROUTINE W REFLEX MICROSCOPIC
Bilirubin Urine: NEGATIVE
Glucose, UA: NEGATIVE mg/dL
Hgb urine dipstick: NEGATIVE
Ketones, ur: 5 mg/dL — AB
Leukocytes,Ua: NEGATIVE
Nitrite: NEGATIVE
Protein, ur: 30 mg/dL — AB
Specific Gravity, Urine: 1.031 — ABNORMAL HIGH (ref 1.005–1.030)
pH: 5 (ref 5.0–8.0)

## 2023-03-01 LAB — COMPREHENSIVE METABOLIC PANEL
ALT: 83 U/L — ABNORMAL HIGH (ref 0–44)
AST: 128 U/L — ABNORMAL HIGH (ref 15–41)
Albumin: 3.6 g/dL (ref 3.5–5.0)
Alkaline Phosphatase: 120 U/L (ref 38–126)
Anion gap: 13 (ref 5–15)
BUN: 16 mg/dL (ref 6–20)
CO2: 23 mmol/L (ref 22–32)
Calcium: 9.1 mg/dL (ref 8.9–10.3)
Chloride: 103 mmol/L (ref 98–111)
Creatinine, Ser: 0.97 mg/dL (ref 0.44–1.00)
GFR, Estimated: >60 mL/min (ref >60)
Glucose, Bld: 129 mg/dL — ABNORMAL HIGH (ref 70–99)
Potassium: 3.8 mmol/L (ref 3.5–5.1)
Sodium: 139 mmol/L (ref 135–145)
Total Bilirubin: 0.7 mg/dL (ref 0.3–1.2)
Total Protein: 6.4 g/dL — ABNORMAL LOW (ref 6.5–8.1)

## 2023-03-01 LAB — LIPASE, BLOOD: Lipase: 105 U/L — ABNORMAL HIGH (ref 11–51)

## 2023-03-01 LAB — TROPONIN I (HIGH SENSITIVITY)
Troponin I (High Sensitivity): 3 ng/L (ref <18)
Troponin I (High Sensitivity): 5 ng/L (ref <18)

## 2023-03-01 LAB — HCG, QUANTITATIVE, PREGNANCY: hCG, Beta Chain, Quant, S: 1 m[IU]/mL (ref <5)

## 2023-03-01 MED ORDER — ONDANSETRON 4 MG PO TBDP: 4.0000 mg | ORAL_TABLET | Freq: Three times a day (TID) | ORAL | 0 refills | Status: AC | PRN

## 2023-03-01 MED ORDER — MORPHINE SULFATE (PF) 4 MG/ML IV SOLN
4.0000 mg | Freq: Once | INTRAVENOUS | Status: AC
  Administered 2023-03-01: 4 mg via INTRAVENOUS
  Filled 2023-03-01: qty 1

## 2023-03-01 MED ORDER — OXYCODONE-ACETAMINOPHEN 5-325 MG PO TABS: 1.0000 | ORAL_TABLET | Freq: Four times a day (QID) | ORAL | 0 refills | Status: DC | PRN

## 2023-03-01 MED ORDER — IOHEXOL 350 MG/ML SOLN
75.0000 mL | Freq: Once | INTRAVENOUS | Status: AC | PRN
  Administered 2023-03-01: 75 mL via INTRAVENOUS

## 2023-03-01 MED ORDER — CEPHALEXIN 500 MG PO CAPS: 500.0000 mg | ORAL_CAPSULE | Freq: Four times a day (QID) | ORAL | 0 refills | Status: AC

## 2023-03-01 MED ORDER — SODIUM CHLORIDE 0.9 % IV BOLUS
1000.0000 mL | Freq: Once | INTRAVENOUS | Status: AC
  Administered 2023-03-01: 1000 mL via INTRAVENOUS

## 2023-03-01 NOTE — Discharge Instructions (Addendum)
You are seen in the emergency department for abdominal pain.  Your labs and imaging were thankfully reassuring although your lipase level which checks pancreatic function was slightly elevated.  This could be due to your vomiting and not the cause of your vomiting.  Your CT scan does also show stones in both of your kidneys however not actively in your urinary system to cause any symptoms.  I would recommend managing her symptoms with medications as prescribed you for use at home including Zofran, Percocet, and Keflex.  Please take these as prescribed and follow-up with your primary care provider.  Your symptoms worsen, return to the emergency department.

## 2023-03-02 LAB — URINE CULTURE
Culture: <10000 — AB
Special Requests: NORMAL

## 2023-05-11 ENCOUNTER — Ambulatory Visit (INDEPENDENT_AMBULATORY_CARE_PROVIDER_SITE_OTHER): Payer: BC Managed Care – PPO | Admitting: Podiatry

## 2023-05-11 ENCOUNTER — Encounter: Payer: Self-pay | Admitting: Podiatry

## 2023-05-11 DIAGNOSIS — B351 Tinea unguium: Secondary | ICD-10-CM | POA: Diagnosis not present

## 2023-05-11 DIAGNOSIS — M2041 Other hammer toe(s) (acquired), right foot: Secondary | ICD-10-CM

## 2023-05-11 NOTE — Progress Notes (Signed)
Subjective:   Patient ID: [name], female   DOB: 52 y.o.   MRN: [id number]   HPI Patient presents concerned about discoloration of the second nailbed right slight on the left second nailbed with thickness and history of endoscopic plantar fascial surgery.  Patient does not smoke is active   Review of Systems  All other systems reviewed and are negative.       Objective:  Physical Exam Vitals and nursing note reviewed.  Constitutional:      Appearance: She is well-developed.  Pulmonary:     Effort: Pulmonary effort is normal.  Musculoskeletal:        General: Normal range of motion.  Skin:    General: Skin is warm.  Neurological:     Mental Status: She is alert.     Neurovascular status intact muscle strength adequate range of motion adequate with patient found to have discoloration of the right hallux nail involving the medial two thirds of the bed with thickness and signs of trauma with a left second nail showing irritation of the medial border also there is also moderate digital deformities     Assessment:  Probability that this is trauma over fungus or any other pathology given the fact it is affect both and there is thickness and there is disc comfort of the toes themselves     Plan:  H&P PT educated her on this do not recommend removal of the nail which still could be necessary in the future but at this point I think it is overkill and I did would allow it to regrow.  If it were to give her problems or change at all we will treat it differently but at this point I see this is a stable condition even though there is still may be discoloration for a significant period of time

## 2023-07-05 NOTE — Progress Notes (Signed)
.  POV

## 2023-10-29 ENCOUNTER — Emergency Department (HOSPITAL_BASED_OUTPATIENT_CLINIC_OR_DEPARTMENT_OTHER)
Admission: EM | Admit: 2023-10-29 | Discharge: 2023-10-29 | Disposition: A | Discharge status: Home / Self Care | Attending: Emergency Medicine | Admitting: Emergency Medicine

## 2023-10-29 ENCOUNTER — Emergency Department (HOSPITAL_BASED_OUTPATIENT_CLINIC_OR_DEPARTMENT_OTHER)

## 2023-10-29 ENCOUNTER — Encounter (HOSPITAL_BASED_OUTPATIENT_CLINIC_OR_DEPARTMENT_OTHER): Payer: Self-pay | Admitting: Emergency Medicine

## 2023-10-29 ENCOUNTER — Other Ambulatory Visit: Payer: Self-pay

## 2023-10-29 DIAGNOSIS — M545 Low back pain, unspecified: Secondary | ICD-10-CM | POA: Diagnosis present

## 2023-10-29 DIAGNOSIS — M5127 Other intervertebral disc displacement, lumbosacral region: Secondary | ICD-10-CM | POA: Diagnosis not present

## 2023-10-29 LAB — COMPREHENSIVE METABOLIC PANEL WITH GFR
ALT: 38 U/L (ref 0–44)
AST: 31 U/L (ref 15–41)
Albumin: 4.4 g/dL (ref 3.5–5.0)
Alkaline Phosphatase: 134 U/L — ABNORMAL HIGH (ref 38–126)
Anion gap: 12 (ref 5–15)
BUN: 17 mg/dL (ref 6–20)
CO2: 25 mmol/L (ref 22–32)
Calcium: 9.5 mg/dL (ref 8.9–10.3)
Chloride: 104 mmol/L (ref 98–111)
Creatinine, Ser: 0.72 mg/dL (ref 0.44–1.00)
GFR, Estimated: >60 mL/min (ref >60)
Glucose, Bld: 101 mg/dL — ABNORMAL HIGH (ref 70–99)
Potassium: 3.9 mmol/L (ref 3.5–5.1)
Sodium: 141 mmol/L (ref 135–145)
Total Bilirubin: 0.4 mg/dL (ref 0.0–1.2)
Total Protein: 7 g/dL (ref 6.5–8.1)

## 2023-10-29 LAB — CBC WITH DIFFERENTIAL/PLATELET
Abs Immature Granulocytes: 0.03 10*3/uL (ref 0.00–0.07)
Basophils Absolute: 0 10*3/uL (ref 0.0–0.1)
Basophils Relative: 0 %
Eosinophils Absolute: 0.1 10*3/uL (ref 0.0–0.5)
Eosinophils Relative: 2 %
HCT: 41.2 % (ref 36.0–46.0)
Hemoglobin: 13.5 g/dL (ref 12.0–15.0)
Immature Granulocytes: 0 %
Lymphocytes Relative: 35 %
Lymphs Abs: 3.1 10*3/uL (ref 0.7–4.0)
MCH: 28 pg (ref 26.0–34.0)
MCHC: 32.8 g/dL (ref 30.0–36.0)
MCV: 85.5 fL (ref 80.0–100.0)
Monocytes Absolute: 0.6 10*3/uL (ref 0.1–1.0)
Monocytes Relative: 7 %
Neutro Abs: 4.9 10*3/uL (ref 1.7–7.7)
Neutrophils Relative %: 56 %
Platelets: 299 10*3/uL (ref 150–400)
RBC: 4.82 MIL/uL (ref 3.87–5.11)
RDW: 13.2 % (ref 11.5–15.5)
WBC: 8.8 10*3/uL (ref 4.0–10.5)
nRBC: 0 % (ref 0.0–0.2)

## 2023-10-29 MED ORDER — HYDROMORPHONE HCL 1 MG/ML IJ SOLN
1.0000 mg | Freq: Once | INTRAMUSCULAR | Status: AC
  Administered 2023-10-29: 1 mg via INTRAVENOUS
  Filled 2023-10-29: qty 1

## 2023-10-29 MED ORDER — KETOROLAC TROMETHAMINE 30 MG/ML IJ SOLN
30.0000 mg | Freq: Once | INTRAMUSCULAR | Status: AC
  Administered 2023-10-29: 30 mg via INTRAVENOUS
  Filled 2023-10-29: qty 1

## 2023-10-29 MED ORDER — FENTANYL CITRATE PF 50 MCG/ML IJ SOSY
50.0000 ug | PREFILLED_SYRINGE | Freq: Once | INTRAMUSCULAR | Status: AC
  Administered 2023-10-29: 50 ug via INTRAVENOUS
  Filled 2023-10-29: qty 1

## 2023-10-29 MED ORDER — DIAZEPAM 5 MG/ML IJ SOLN
2.5000 mg | Freq: Once | INTRAMUSCULAR | Status: AC
  Administered 2023-10-29: 2.5 mg via INTRAVENOUS
  Filled 2023-10-29: qty 2

## 2023-10-29 MED ORDER — CYCLOBENZAPRINE HCL 5 MG PO TABS
5.0000 mg | ORAL_TABLET | Freq: Three times a day (TID) | ORAL | 0 refills | Status: AC | PRN
Start: 2023-10-29 — End: ?

## 2023-10-29 MED ORDER — ONDANSETRON HCL 4 MG/2ML IJ SOLN
4.0000 mg | Freq: Once | INTRAMUSCULAR | Status: AC
  Administered 2023-10-29: 4 mg via INTRAVENOUS
  Filled 2023-10-29: qty 2

## 2023-10-29 MED ORDER — METHYLPREDNISOLONE 4 MG PO TBPK: ORAL_TABLET | ORAL | 0 refills | Status: AC

## 2023-10-29 MED ORDER — OXYCODONE-ACETAMINOPHEN 5-325 MG PO TABS: 1.0000 | ORAL_TABLET | Freq: Four times a day (QID) | ORAL | 0 refills | Status: AC | PRN

## 2023-10-29 NOTE — ED Triage Notes (Signed)
 Pt c/o lower back pain since yesterday; denies injury; hx of disc problems

## 2023-10-29 NOTE — Discharge Instructions (Addendum)
 As we discussed, you have herniated disc at L5-S1 likely causing your symptoms  You are expected to have back pain and spasms.  I have prescribed Medrol  Dosepak  Take Tylenol  or Motrin  for pain and Flexeril for muscle spasm  For severe pain you may take Percocet as prescribed.  I have referred you to [facility] for follow-up.  If they do not call you by Tuesday or Wednesday, please call their office for follow-up  Return to ER if you have severe pain, trouble walking, numbness or weakness

## 2023-10-29 NOTE — ED Notes (Addendum)
 Patient reassessed after returning from CT. Patient reports her back pain improved, but now she is having abdominal cramping. Vital re-checked. MD Delana Favors made aware.

## 2023-10-29 NOTE — ED Provider Notes (Signed)
 [facility] Provider Note   CSN: [id number] Arrival date & time: 10/29/23  2020     History  Chief Complaint  Patient presents with   Back Pain    [name] is a 53 y.o. female history of chronic back pain, interstitial cystitis status post nerve stimulator, here presenting with back pain.  Patient states that she has lumbar disc disease.  She had previously seen [facility].  She states that yesterday she went to the store and her back pain suddenly got worse.  She denies any trauma or injury.  She states that she had trouble walking due to pain.  She states that she started having muscle spasm today.  Denies any incontinence or focal weakness or numbness.  Denies any fevers or chills.  Patient last had injections several years ago and [facility].  The history is provided by the patient.       Home Medications Prior to Admission medications   Medication Sig Start Date End Date Taking? Authorizing Provider  atorvastatin  (LIPITOR) 20 MG tablet Take 20 mg by mouth daily. 07/10/17   [provider]  hydrOXYzine (VISTARIL) 50 MG capsule Take 50 mg by mouth at bedtime.  12/30/16   [provider]  ibuprofen  (ADVIL ) 200 MG tablet Take 800 mg by mouth every 6 (six) hours as needed for moderate pain.    [provider]  LORazepam  (ATIVAN ) 0.5 MG tablet Take 0.5 mg by mouth daily as needed for anxiety.     [provider]  ondansetron  (ZOFRAN -ODT) 4 MG disintegrating tablet Take 1 tablet (4 mg total) by mouth every 8 (eight) hours as needed. 03/01/23   Zelaya, Oscar A, PA-C  oxyCODONE -acetaminophen  (PERCOCET/ROXICET) 5-325 MG tablet Take 1 tablet by mouth every 6 (six) hours as needed for severe pain. 03/01/23   Zelaya, Oscar A, PA-C  traZODone  (DESYREL ) 100 MG tablet Take 100 mg by mouth at bedtime.  09/01/11   [provider]      Allergies    Augmentin [amoxicillin-pot  clavulanate], Bisacodyl, and Adhesive [tape]    Review of Systems   Review of Systems  Musculoskeletal:  Positive for back pain.  All other systems reviewed and are negative.   Physical Exam Updated Vital Signs BP (!) 135/96 (BP Location: Right Arm)   Pulse 99   Temp 97.8 F (36.6 C)   Resp 18   Ht 5\' 3"  (1.6 m)   Wt 87.5 kg   LMP 02/19/2017 (Approximate)   SpO2 98%   BMI 34.19 kg/m  Physical Exam Vitals and nursing note reviewed.  Constitutional:      Comments: Uncomfortable  HENT:     Head: Normocephalic.     Nose: Nose normal.     Mouth/Throat:     Mouth: Mucous membranes are moist.  Eyes:     Extraocular Movements: Extraocular movements intact.     Pupils: Pupils are equal, round, and reactive to light.  Cardiovascular:     Rate and Rhythm: Normal rate and regular rhythm.     Pulses: Normal pulses.  Pulmonary:     Effort: Pulmonary effort is normal.     Breath sounds: Normal breath sounds.  Abdominal:     General: Abdomen is flat.     Palpations: Abdomen is soft.  Musculoskeletal:     Cervical back: Normal range of motion.     Comments: Diffuse paralumbar tenderness.  Patient does have previous midline lumbar scar from  her nerve stimulator.  In particular patient does have right SI joint tenderness  Skin:    General: Skin is warm.  Neurological:     General: No focal deficit present.     Comments: Patient has no obvious saddle anesthesia but has positive straight leg raise bilaterally.  Patient has normal reflexes bilateral knees  Psychiatric:        Mood and Affect: Mood normal.        Behavior: Behavior normal.     ED Results / Procedures / Treatments   Labs (all labs ordered are listed, but only abnormal results are displayed) Labs Reviewed  CBC WITH DIFFERENTIAL/PLATELET  COMPREHENSIVE METABOLIC PANEL WITH GFR    EKG None  Radiology No results found.  Procedures Procedures    Medications Ordered in ED Medications  HYDROmorphone   (DILAUDID ) injection 1 mg (1 mg Intravenous Given 10/29/23 2139)  diazepam  (VALIUM ) injection 2.5 mg (2.5 mg Intravenous Given 10/29/23 2144)  ketorolac  (TORADOL ) 30 MG/ML injection 30 mg (30 mg Intravenous Given 10/29/23 2137)    ED Course/ Medical Decision Making/ A&P                                 Medical Decision Making [name] is a 53 y.o. female here presenting with back pain and trouble walking.  Patient has tenderness of the SI joint and also diffuse lower lumbar area.  Consider SI joint pain versus lumbar radiculopathy versus lumbar disc disease.  Plan to get CBC and CMP and CT lumbar spine.  Will give pain medicine and muscle relaxant and Toradol  and reassess  10:37 PM Review of labs and they were unremarkable.  CT showed small left paracentral protrusion at L5-S1 that is likely causing her symptoms.  Patient felt better after pain medicine and muscle relaxant.  Will refer back to [facility] for follow-up.  Will give a course of steroids and pain medicine and muscle relaxant.  Problems Addressed: Herniated nucleus pulposus, L5-S1, left: acute illness or injury  Amount and/or Complexity of Data Reviewed Labs: ordered. Radiology: ordered.  Risk Prescription drug management.    Final Clinical Impression(s) / ED Diagnoses Final diagnoses:  None    Rx / DC Orders ED Discharge Orders     None         Dalene Duck, MD 10/29/23 2238

## 2023-10-29 NOTE — ED Notes (Signed)
 ED Provider at bedside. Patient/family provide update.

## 2024-03-21 ENCOUNTER — Ambulatory Visit: Admitting: Podiatry
